# Patient Record
Sex: Female | Born: 1935 | Race: White | Hispanic: No | Smoking: Never smoker
Health system: Southern US, Community
[De-identification: ages and names within clinical notes are randomized; demographics above are authoritative.]

## PROBLEM LIST (undated history)

## (undated) DIAGNOSIS — F329 Major depressive disorder, single episode, unspecified: Secondary | ICD-10-CM

## (undated) DIAGNOSIS — R7302 Impaired glucose tolerance (oral): Secondary | ICD-10-CM

## (undated) DIAGNOSIS — F32A Depression, unspecified: Secondary | ICD-10-CM

## (undated) DIAGNOSIS — H9190 Unspecified hearing loss, unspecified ear: Secondary | ICD-10-CM

## (undated) DIAGNOSIS — IMO0001 Reserved for inherently not codable concepts without codable children: Secondary | ICD-10-CM

## (undated) DIAGNOSIS — H3530 Unspecified macular degeneration: Secondary | ICD-10-CM

## (undated) DIAGNOSIS — Z950 Presence of cardiac pacemaker: Secondary | ICD-10-CM

## (undated) DIAGNOSIS — I442 Atrioventricular block, complete: Secondary | ICD-10-CM

## (undated) DIAGNOSIS — I472 Ventricular tachycardia, unspecified: Secondary | ICD-10-CM

## (undated) DIAGNOSIS — M1990 Unspecified osteoarthritis, unspecified site: Secondary | ICD-10-CM

## (undated) DIAGNOSIS — E785 Hyperlipidemia, unspecified: Secondary | ICD-10-CM

## (undated) DIAGNOSIS — T7840XA Allergy, unspecified, initial encounter: Secondary | ICD-10-CM

## (undated) DIAGNOSIS — E039 Hypothyroidism, unspecified: Secondary | ICD-10-CM

## (undated) DIAGNOSIS — I428 Other cardiomyopathies: Secondary | ICD-10-CM

## (undated) DIAGNOSIS — I10 Essential (primary) hypertension: Secondary | ICD-10-CM

## (undated) HISTORY — DX: Unspecified osteoarthritis, unspecified site: M19.90

## (undated) HISTORY — DX: Hyperlipidemia, unspecified: E78.5

## (undated) HISTORY — DX: Allergy, unspecified, initial encounter: T78.40XA

## (undated) HISTORY — DX: Hypothyroidism, unspecified: E03.9

## (undated) HISTORY — PX: TONSILLECTOMY: SUR1361

## (undated) HISTORY — PX: APPENDECTOMY: SHX54

## (undated) HISTORY — DX: Other cardiomyopathies: I42.8

## (undated) HISTORY — DX: Unspecified macular degeneration: H35.30

## (undated) HISTORY — PX: OTHER SURGICAL HISTORY: SHX169

## (undated) HISTORY — DX: Ventricular tachycardia: I47.2

## (undated) HISTORY — DX: Essential (primary) hypertension: I10

## (undated) HISTORY — DX: Ventricular tachycardia, unspecified: I47.20

## (undated) HISTORY — DX: Presence of cardiac pacemaker: Z95.0

## (undated) HISTORY — DX: Impaired glucose tolerance (oral): R73.02

## (undated) HISTORY — DX: Atrioventricular block, complete: I44.2

## (undated) HISTORY — PX: CHOLECYSTECTOMY: SHX55

---

## 1998-12-16 ENCOUNTER — Emergency Department (HOSPITAL_COMMUNITY): Admission: EM | Admit: 1998-12-16 | Discharge: 1998-12-16 | Payer: Self-pay | Admitting: Emergency Medicine

## 1998-12-16 ENCOUNTER — Encounter: Payer: Self-pay | Admitting: Emergency Medicine

## 1999-09-25 ENCOUNTER — Other Ambulatory Visit: Admission: RE | Admit: 1999-09-25 | Discharge: 1999-09-25 | Payer: Self-pay | Admitting: Family Medicine

## 2000-09-06 ENCOUNTER — Encounter: Payer: Self-pay | Admitting: Family Medicine

## 2000-09-06 ENCOUNTER — Ambulatory Visit (HOSPITAL_COMMUNITY): Admission: RE | Admit: 2000-09-06 | Discharge: 2000-09-06 | Payer: Self-pay | Admitting: Family Medicine

## 2001-05-19 ENCOUNTER — Other Ambulatory Visit: Admission: RE | Admit: 2001-05-19 | Discharge: 2001-05-19 | Payer: Self-pay | Admitting: Family Medicine

## 2002-01-23 ENCOUNTER — Ambulatory Visit (HOSPITAL_COMMUNITY): Admission: RE | Admit: 2002-01-23 | Discharge: 2002-01-23 | Payer: Self-pay | Admitting: Family Medicine

## 2002-01-23 ENCOUNTER — Encounter: Payer: Self-pay | Admitting: Family Medicine

## 2002-07-20 ENCOUNTER — Encounter: Admission: RE | Admit: 2002-07-20 | Discharge: 2002-07-20 | Payer: Self-pay | Admitting: Cardiology

## 2002-07-20 ENCOUNTER — Encounter: Payer: Self-pay | Admitting: Cardiology

## 2002-08-08 ENCOUNTER — Emergency Department (HOSPITAL_COMMUNITY): Admission: EM | Admit: 2002-08-08 | Discharge: 2002-08-08 | Payer: Self-pay | Admitting: Emergency Medicine

## 2002-09-22 ENCOUNTER — Ambulatory Visit: Admission: RE | Admit: 2002-09-22 | Discharge: 2002-09-22 | Payer: Self-pay | Admitting: Family Medicine

## 2002-10-14 ENCOUNTER — Ambulatory Visit (HOSPITAL_COMMUNITY): Admission: RE | Admit: 2002-10-14 | Discharge: 2002-10-14 | Payer: Self-pay | Admitting: Cardiology

## 2002-10-14 ENCOUNTER — Encounter (INDEPENDENT_AMBULATORY_CARE_PROVIDER_SITE_OTHER): Payer: Self-pay | Admitting: Cardiology

## 2002-10-20 ENCOUNTER — Encounter: Payer: Self-pay | Admitting: Cardiology

## 2002-10-20 ENCOUNTER — Ambulatory Visit (HOSPITAL_COMMUNITY): Admission: RE | Admit: 2002-10-20 | Discharge: 2002-10-20 | Payer: Self-pay | Admitting: Cardiology

## 2003-04-29 ENCOUNTER — Inpatient Hospital Stay (HOSPITAL_COMMUNITY): Admission: RE | Admit: 2003-04-29 | Discharge: 2003-05-01 | Payer: Self-pay | Admitting: Internal Medicine

## 2003-05-10 ENCOUNTER — Observation Stay (HOSPITAL_COMMUNITY): Admission: EM | Admit: 2003-05-10 | Discharge: 2003-05-11 | Payer: Self-pay | Admitting: Emergency Medicine

## 2003-05-19 ENCOUNTER — Inpatient Hospital Stay (HOSPITAL_COMMUNITY): Admission: EM | Admit: 2003-05-19 | Discharge: 2003-05-25 | Payer: Self-pay | Admitting: Emergency Medicine

## 2003-05-20 ENCOUNTER — Encounter (INDEPENDENT_AMBULATORY_CARE_PROVIDER_SITE_OTHER): Payer: Self-pay | Admitting: Cardiology

## 2003-09-06 ENCOUNTER — Emergency Department (HOSPITAL_COMMUNITY): Admission: EM | Admit: 2003-09-06 | Discharge: 2003-09-06 | Payer: Self-pay | Admitting: *Deleted

## 2003-09-07 ENCOUNTER — Inpatient Hospital Stay (HOSPITAL_COMMUNITY): Admission: EM | Admit: 2003-09-07 | Discharge: 2003-09-08 | Payer: Self-pay | Admitting: Emergency Medicine

## 2003-09-22 ENCOUNTER — Inpatient Hospital Stay (HOSPITAL_COMMUNITY): Admission: EM | Admit: 2003-09-22 | Discharge: 2003-09-24 | Payer: Self-pay | Admitting: Emergency Medicine

## 2005-01-01 IMAGING — CR DG CHEST 1V PORT
1 series · 1 of 1 positions shown · non-contrast
Comparison: none

CLINICAL DATA: Syncopal episode.  
 AP PORTABLE CHEST ? 09/06/2003 ([DATE] HOURS)
 Compare examination 05/10/2003. 
 Cardiac enlargement with left ventricular hypertrophic change redemonstrated.  Cardiac pacer remains in satisfactory position.  
 Lungs clear without edema, infiltrates or effusions.  Accelerated degenerative change is seen about the left shoulder.  
 IMPRESSION
 Cardiac enlargement without acute pulmonary process

[view not recorded]
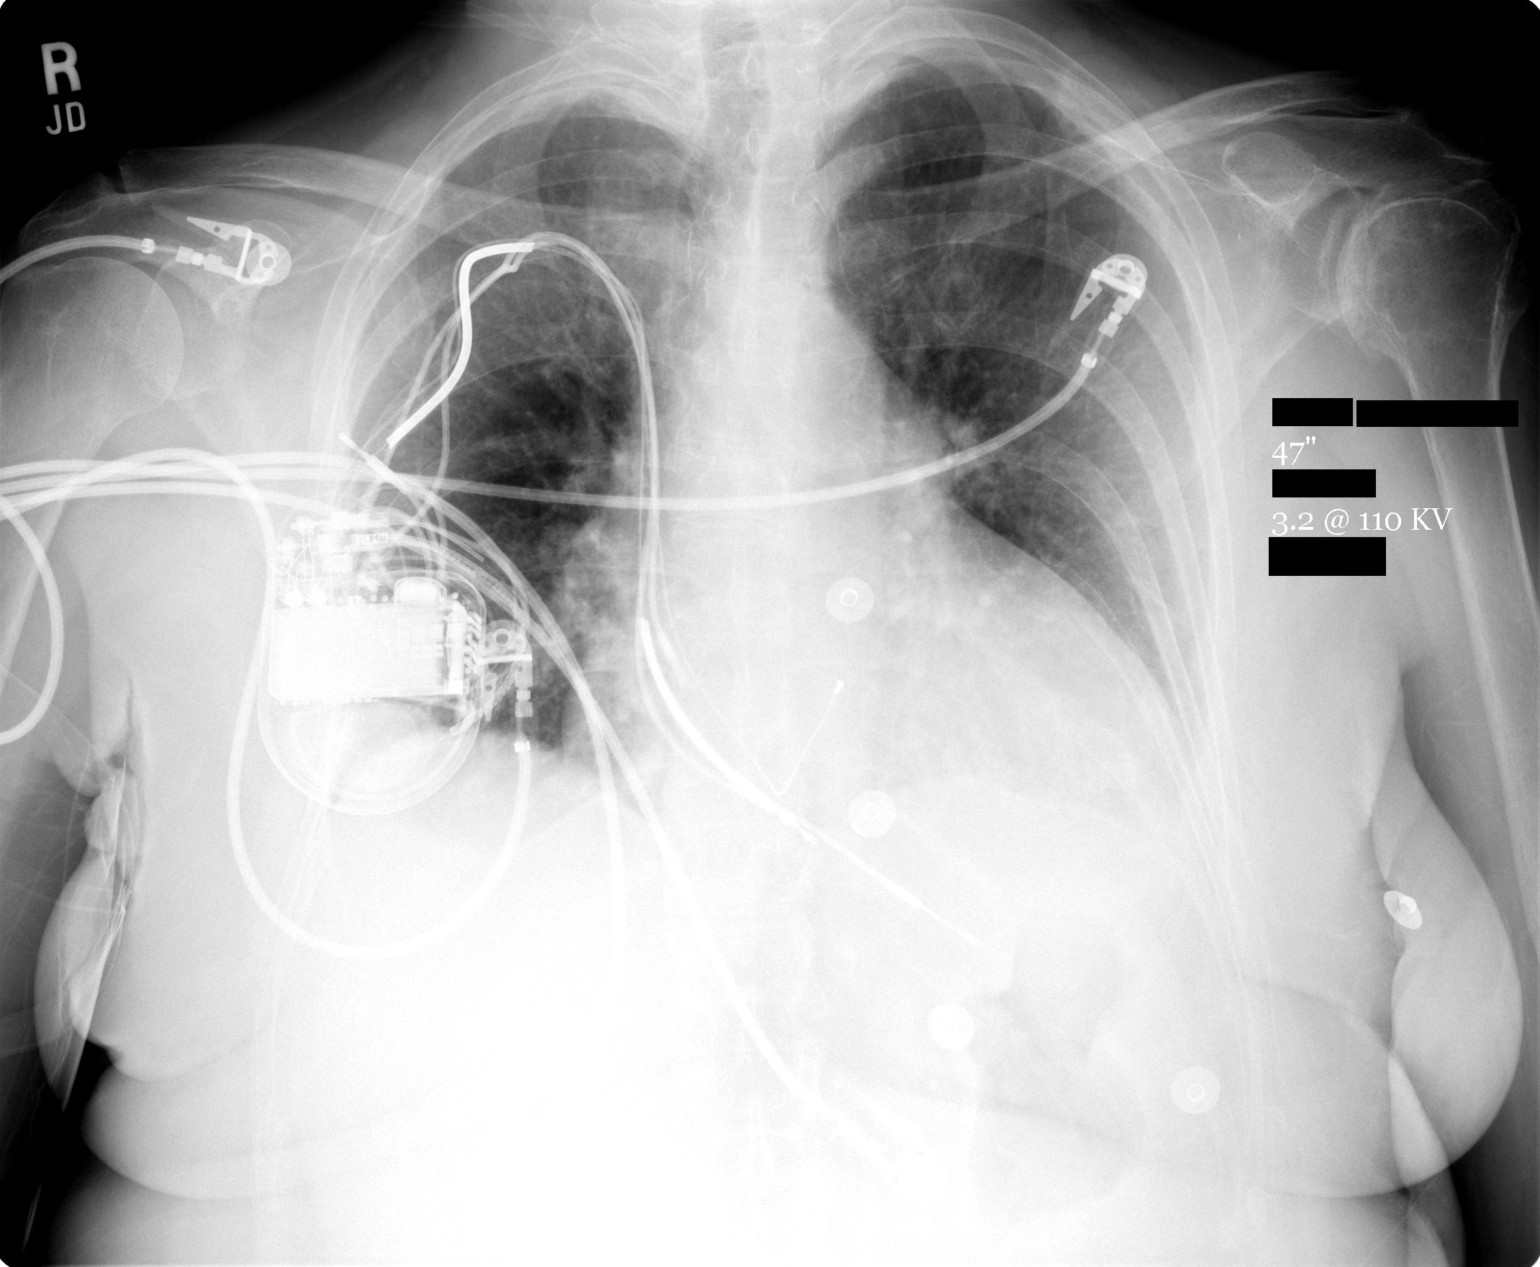

[1 of 1 positions shown; findings below may reference images not displayed]

## 2005-01-03 IMAGING — CR DG CHEST 2V
2 series · 2 of 2 positions shown · non-contrast
Comparison: 09/06/03.

CLINICAL DATA: Pacemaker failure. 
 TWO VIEW CHEST

[view not recorded (1 of 2)]
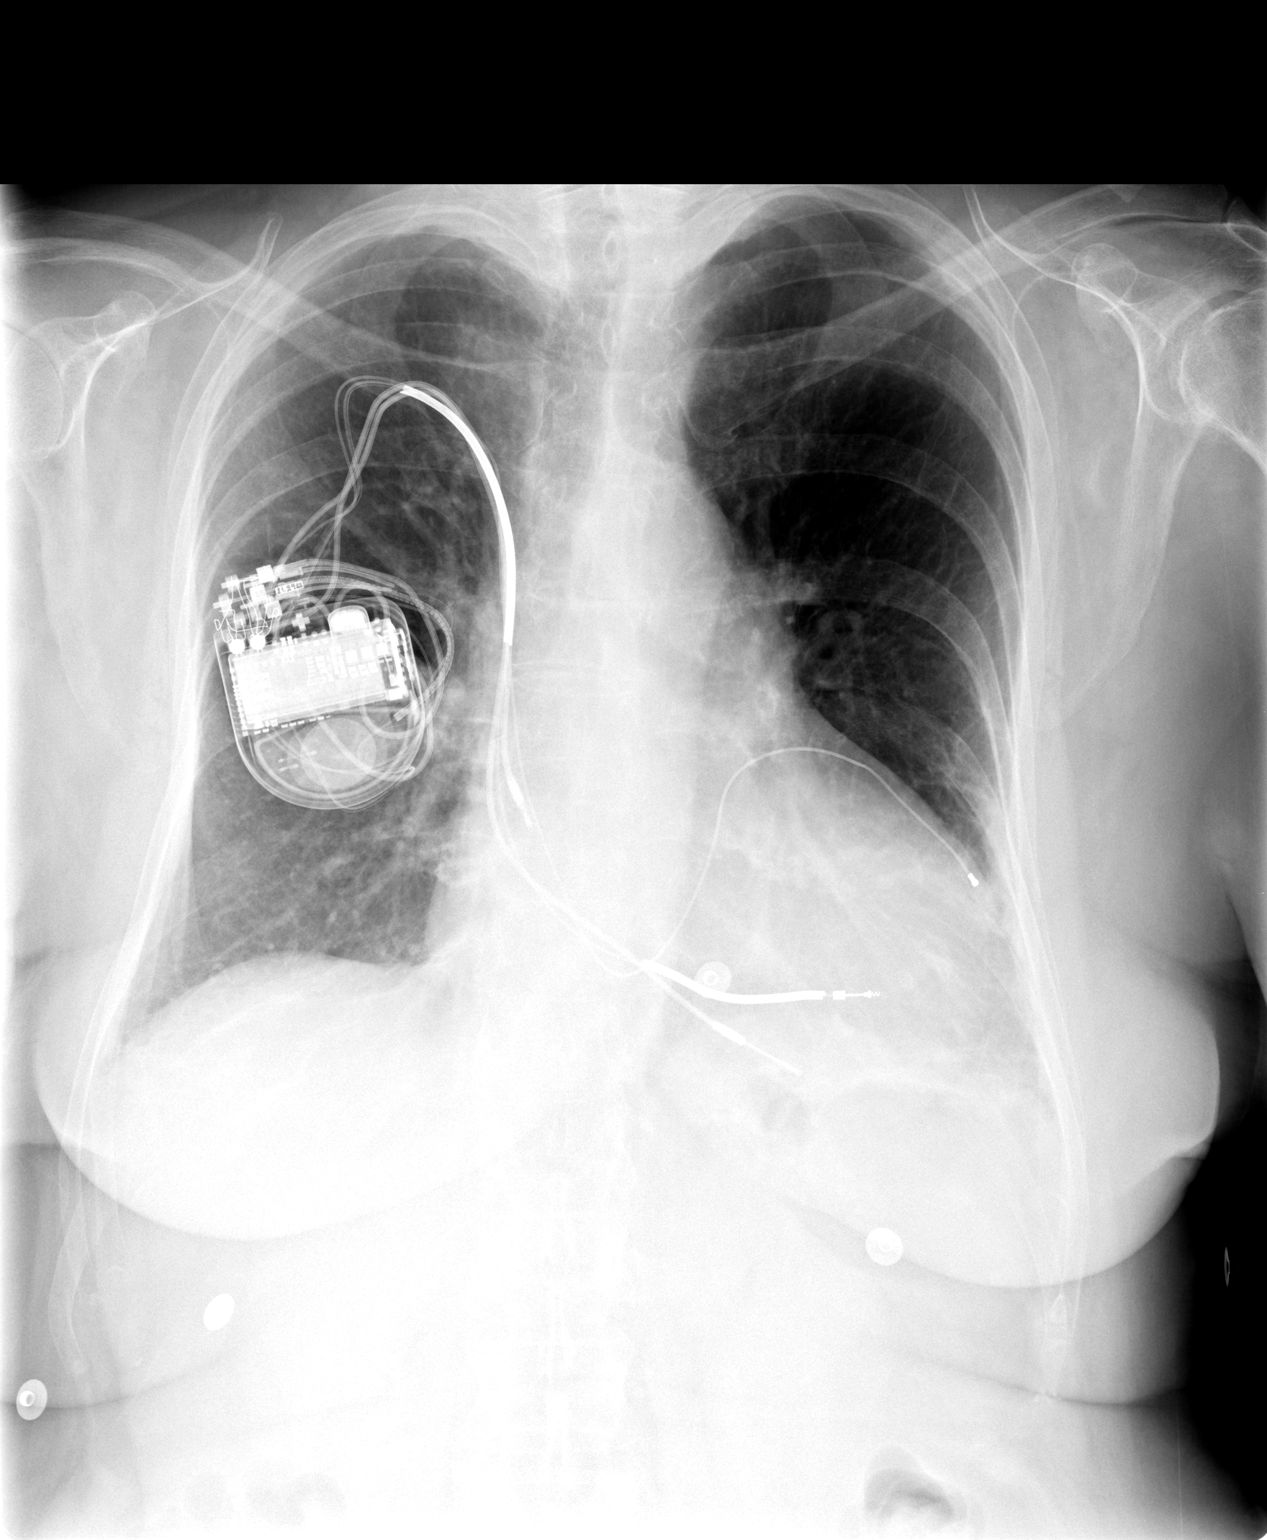

[view not recorded (2 of 2)]
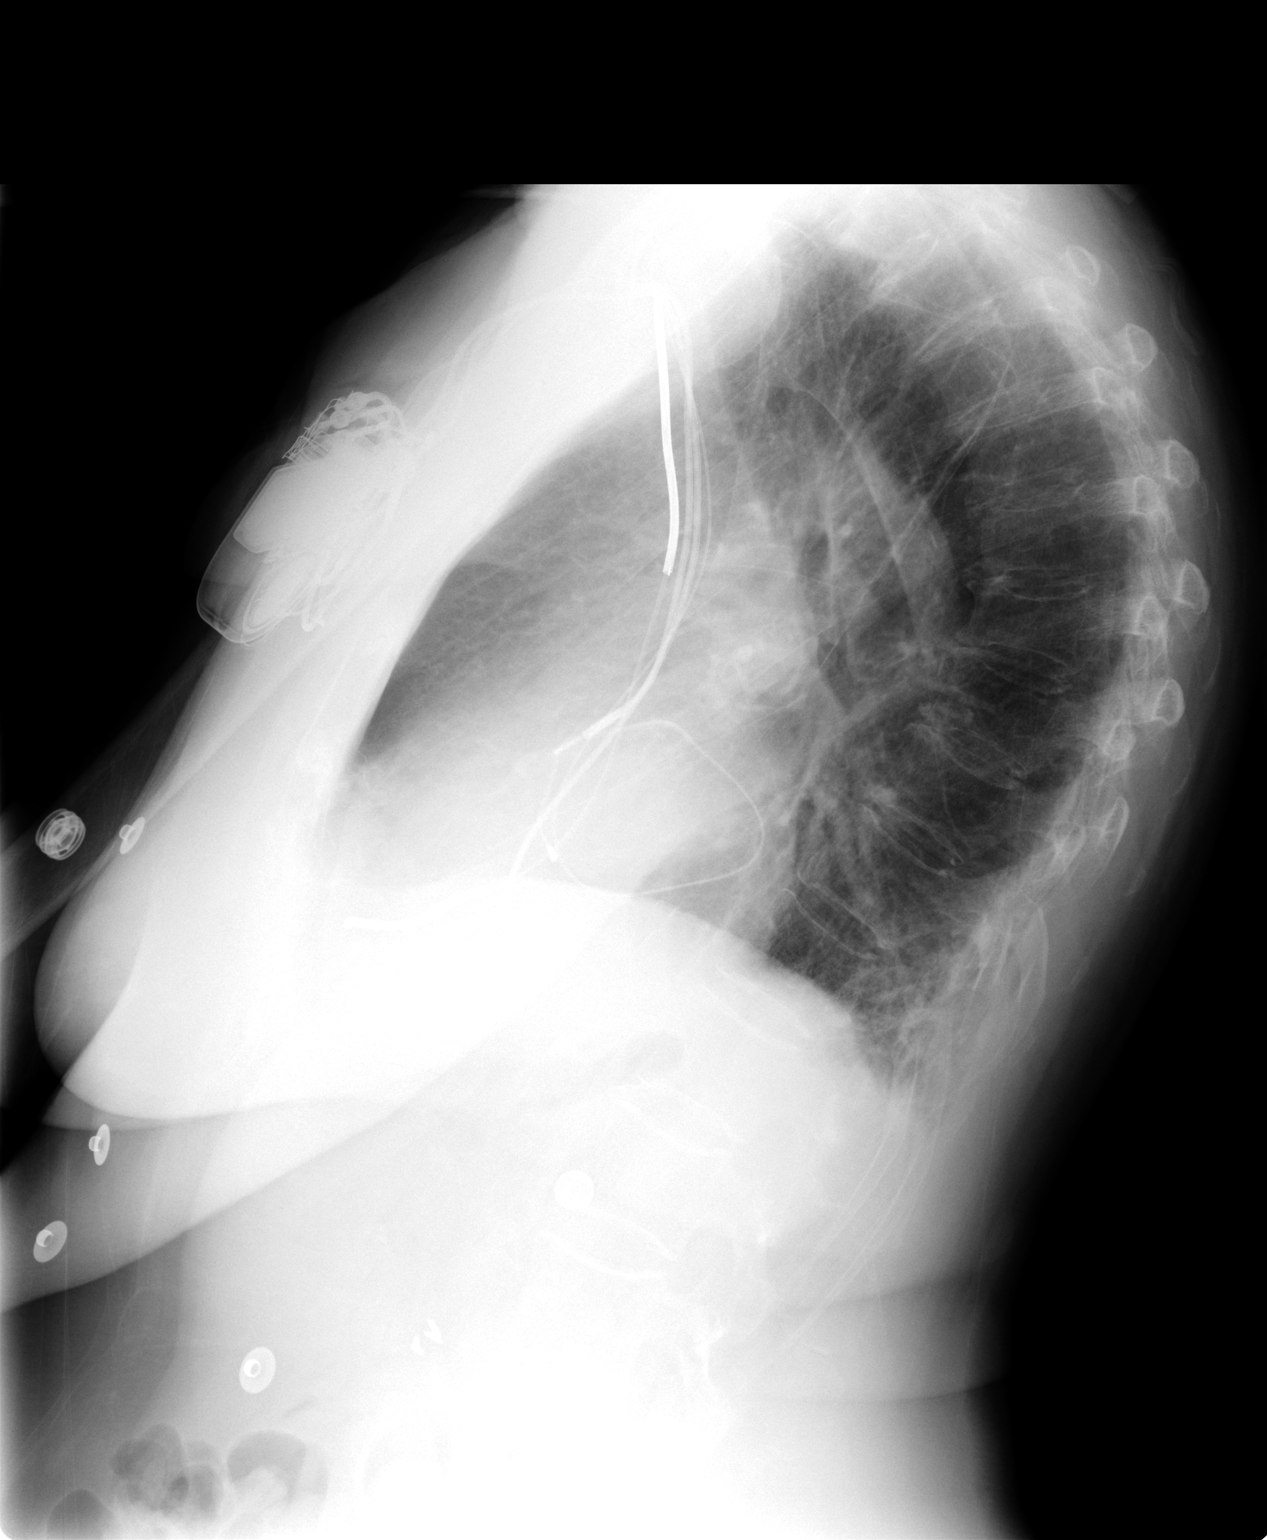

[2 of 2 positions shown; findings below may reference images not displayed]

Heart enlarged.  No congestive heart failure or active disease.  Trilead intracardiac pacer/defibrillator unchanged. 
 Bones demineralized with senile kyphosis. 
 IMPRESSION
 1.  Cardiomegaly and trilead pacer/defibrillator. 
 2.  No congestive heart failure or active disease.

## 2006-04-18 ENCOUNTER — Ambulatory Visit: Payer: Self-pay | Admitting: Internal Medicine

## 2006-04-26 ENCOUNTER — Ambulatory Visit: Payer: Self-pay | Admitting: Internal Medicine

## 2006-04-26 ENCOUNTER — Ambulatory Visit: Admission: RE | Admit: 2006-04-26 | Discharge: 2006-04-26 | Payer: Self-pay | Admitting: Internal Medicine

## 2009-10-25 ENCOUNTER — Encounter: Payer: Self-pay | Admitting: Internal Medicine

## 2010-01-09 ENCOUNTER — Ambulatory Visit: Payer: Self-pay | Admitting: Cardiology

## 2010-04-01 ENCOUNTER — Encounter: Payer: Self-pay | Admitting: Internal Medicine

## 2010-05-04 ENCOUNTER — Ambulatory Visit: Payer: Self-pay | Admitting: Internal Medicine

## 2010-06-14 ENCOUNTER — Ambulatory Visit: Payer: Self-pay | Admitting: Cardiology

## 2010-06-22 NOTE — Procedures (Signed)
Summary: icd check/  medtronic   Current Medications (verified): 1)  Carvedilol 25 Mg Tabs (Carvedilol) .... One By Mouth Two Times A Day 2)  Synthroid 50 Mcg Tabs (Levothyroxine Sodium) .... One By Mouth Daily 3)  Aspirin 325 Mg Tabs (Aspirin) .... One By Mouth Daily 4)  Ra Fish Oil 1000 Mg Caps (Omega-3 Fatty Acids) .... One By Mouth Daily 5)  Tramadol Hcl 50 Mg Tabs (Tramadol Hcl) .... As Needed 6)  Valium 10 Mg Tabs (Diazepam) .... 2 By Mouth Daily  Allergies (verified): No Known Drug Allergies   ICD Specifications Following MD:  Lewayne Bunting, MD     Referring MD:  Vonna Drafts ICD Vendor:  Medtronic     ICD Model Number:  [id number]     ICD Serial Number:  [id number] ICD DOI:  04/26/2006     ICD Implanting MD:  Lewayne Bunting, MD  Lead 1:    Location: RA     DOI: 03/18/1995     Model #: [id number]     Serial #: [id number]     Status: active Lead 2:    Location: RV     DOI: 04/29/2003     Model #: [id number]     Serial #: [id number]     Status: active Lead 3:    Location: LV     DOI: 04/29/2003     Model #: [id number]     Serial #: [id number]     Status: active  Indications::  ICM  CHB   ICD Follow Up Remote Check?  No Battery Voltage:  2.64 V     Charge Time:  12.74 seconds     Underlying rhythm:  dependent ICD Dependent:  Yes       ICD Device Measurements Atrium:  Amplitude: 0.7 mV, Impedance: 376 ohms, Threshold: 1.0 V at 0.4 msec Right Ventricle:  Impedance: 416 ohms, Threshold: 1.0 V at 0.5 msec Left Ventricle:  Impedance: 584 ohms, Threshold: 1.0 V at 0.8 msec Shock Impedance: 44/56 ohms   Episodes MS Episodes:  3     Percent Mode Switch:  <0.1%     Coumadin:  No Shock:  0     ATP:  0     Nonsustained:  0     Atrial Pacing:  0.3%     Ventricular Pacing:  100%  Brady Parameters Mode DDDR     Lower Rate Limit:  50     Upper Rate Limit 130 PAV 130     Sensed AV Delay:  100  Tachy Zones VF:  188     VT:  250     VT1:  162     Next Remote Date:  08/03/2010     Next Cardiology Appt Due:   10/20/2010 Tech Comments:  No parameter changes.  Device function normal.  Ms. Alas is extremely sensitive to testing and will take Valium before her visit to lessen her anxiety. Optivol and thoracic impedance abnormal 8/18-10/10.   Carelink transmissions every 3 months. ROV 6 months with Dr. Mckinley Jewel, LPN  May 04, 2010 4:19 PM

## 2010-06-22 NOTE — Letter (Signed)
Summary: Revision [facility] Office Visit Note  [facility] Office Visit Note   Imported By: Roderic Ovens 11/16/2009 09:54:34  _____________________________________________________________________  External Attachment:    Type:   Image     Comment:   External Document

## 2010-06-22 NOTE — Miscellaneous (Signed)
Summary: Device preload  Clinical Lists Changes  Observations: Added new observation of ICD INDICATN: ICM  CHB (04/01/2010 10:02) Added new observation of ICDLEADSTAT3: active (04/01/2010 10:02) Added new observation of ICDLEADSER3: [id number] V (04/01/2010 10:02) Added new observation of ICDLEADMOD3: 4193  (04/01/2010 10:02) Added new observation of ICDLEADDOI3: 04/29/2003  (04/01/2010 10:02) Added new observation of ICDLEADLOC3: LV  (04/01/2010 10:02) Added new observation of ICDLEADSTAT2: active  (04/01/2010 10:02) Added new observation of ICDLEADSER2: [id number] V  (04/01/2010 10:02) Added new observation of ICDLEADMOD2: 6949  (04/01/2010 10:02) Added new observation of ICDLEADDOI2: 04/29/2003  (04/01/2010 10:02) Added new observation of ICDLEADLOC2: RV  (04/01/2010 10:02) Added new observation of ICDLEADSTAT1: active  (04/01/2010 10:02) Added new observation of ICDLEADSER1: 01016RH  (04/01/2010 10:02) Added new observation of ICDLEADMOD1: 438-07  (04/01/2010 10:02) Added new observation of ICDLEADDOI1: 03/18/1995  (04/01/2010 10:02) Added new observation of ICDLEADLOC1: RA  (04/01/2010 10:02) Added new observation of ICD IMP MD: Lewayne Bunting, MD  (04/01/2010 10:02) Added new observation of ICD IMPL DTE: 04/26/2006  (04/01/2010 10:02) Added new observation of ICD SERL#: [id number] H  (04/01/2010 10:02) Added new observation of ICD MODL#: 7299  (04/01/2010 10:02) Added new observation of ICDMANUFACTR: Medtronic  (04/01/2010 10:02) Added new observation of IDC REFER MD: Vonna Drafts  (04/01/2010 10:02) Added new observation of ICD MD: Lewayne Bunting, MD  (04/01/2010 10:02)       ICD Specifications Following MD:  Lewayne Bunting, MD     Referring MD:  Vonna Drafts ICD Vendor:  Medtronic     ICD Model Number:  7299     ICD Serial Number:  [id number] ICD DOI:  04/26/2006     ICD Implanting MD:  Lewayne Bunting, MD  Lead 1:    Location: RA     DOI: 03/18/1995     Model #: 485-46      Serial #: [id number]     Status: active Lead 2:    Location: RV     DOI: 04/29/2003     Model #: [id number]     Serial #: [id number]     Status: active Lead 3:    Location: LV     DOI: 04/29/2003     Model #: [id number]     Serial #: [id number]     Status: active  Indications::  ICM  CHB

## 2010-06-22 NOTE — Cardiovascular Report (Signed)
Summary: Office Visit   Office Visit   Imported By: Roderic Ovens 05/19/2010 16:24:46  _____________________________________________________________________  External Attachment:    Type:   Image     Comment:   External Document

## 2010-08-03 ENCOUNTER — Encounter: Payer: Self-pay | Admitting: Internal Medicine

## 2010-08-03 ENCOUNTER — Encounter (INDEPENDENT_AMBULATORY_CARE_PROVIDER_SITE_OTHER): Payer: Medicare Other

## 2010-08-03 DIAGNOSIS — I509 Heart failure, unspecified: Secondary | ICD-10-CM

## 2010-08-03 DIAGNOSIS — I428 Other cardiomyopathies: Secondary | ICD-10-CM

## 2010-08-10 ENCOUNTER — Telehealth: Payer: Self-pay | Admitting: Cardiology

## 2010-08-10 NOTE — Telephone Encounter (Signed)
Patient's pacer will not need to be checked.  Patient aware

## 2010-08-10 NOTE — Telephone Encounter (Signed)
Patient needs to know if pacemaker will be "tweeked", as she will need to take valium before coming to appointment.  Please call patient.

## 2010-08-12 ENCOUNTER — Encounter: Payer: Self-pay | Admitting: *Deleted

## 2010-08-16 ENCOUNTER — Other Ambulatory Visit: Payer: Self-pay | Admitting: *Deleted

## 2010-08-16 DIAGNOSIS — I428 Other cardiomyopathies: Secondary | ICD-10-CM

## 2010-08-16 MED ORDER — CARVEDILOL 25 MG PO TABS: 25.0000 mg | ORAL_TABLET | Freq: Two times a day (BID) | ORAL | Status: DC

## 2010-08-16 NOTE — Telephone Encounter (Signed)
escribe medication per fax request  

## 2010-08-20 ENCOUNTER — Encounter: Payer: Self-pay | Admitting: *Deleted

## 2010-08-22 ENCOUNTER — Encounter: Payer: Self-pay | Admitting: Physician Assistant

## 2010-08-22 ENCOUNTER — Ambulatory Visit: Payer: Self-pay | Admitting: Physician Assistant

## 2010-08-22 ENCOUNTER — Encounter: Payer: Self-pay | Admitting: *Deleted

## 2010-08-22 ENCOUNTER — Ambulatory Visit (INDEPENDENT_AMBULATORY_CARE_PROVIDER_SITE_OTHER): Payer: Medicare Other | Admitting: Physician Assistant

## 2010-08-22 VITALS — BP 116/72 | HR 71 | Ht 66.0 in | Wt 180.0 lb

## 2010-08-22 DIAGNOSIS — I472 Ventricular tachycardia, unspecified: Secondary | ICD-10-CM | POA: Insufficient documentation

## 2010-08-22 DIAGNOSIS — Z950 Presence of cardiac pacemaker: Secondary | ICD-10-CM | POA: Insufficient documentation

## 2010-08-22 DIAGNOSIS — I5020 Unspecified systolic (congestive) heart failure: Secondary | ICD-10-CM | POA: Insufficient documentation

## 2010-08-22 DIAGNOSIS — E039 Hypothyroidism, unspecified: Secondary | ICD-10-CM | POA: Insufficient documentation

## 2010-08-22 DIAGNOSIS — I10 Essential (primary) hypertension: Secondary | ICD-10-CM | POA: Insufficient documentation

## 2010-08-22 DIAGNOSIS — I428 Other cardiomyopathies: Secondary | ICD-10-CM

## 2010-08-22 DIAGNOSIS — E785 Hyperlipidemia, unspecified: Secondary | ICD-10-CM | POA: Insufficient documentation

## 2010-08-22 NOTE — Progress Notes (Signed)
History of Present Illness: Primary Cardiologist:  Dr. Peter Swaziland Primary Electrophysiologist:  Dr. Lewayne Bunting  Tiffany [name] is a 77 y.o. female with a h/o systolic CHF secondary to NICM, EF 45-50% by last assessment in 2009, minimal CAD by cath in 2004 (40-50% LAD), h/o VTach, s/p BiV-ICD.  Her device is at [facility] and she is scheduled today for preoperative workup.  She denies chest pain.  She has fairly chronic dyspnea with exertion.  She describes and NYHA class II to class IIb symptoms.  She sleeps on 2 pillows.  This is chronic without change.  She denies PND.  She has chronic mild pedal edema.  She denies syncope.  She denies any shocks from her defibrillator.  Her weights have been fairly stable.  Past Medical History  Diagnosis Date  . Status post biventricular pacemaker     with AICD; 2004; revision 2007  . Essential hypertension, benign   . Hyperlipidemia   . Hypothyroidism   . Complete heart block   . Nonischemic cardiomyopathy     EF 45-50% in 2009  . Systolic heart failure   . Glaucoma   . Macular degeneration   . Ventricular tachycardia    Past Surgical History  Procedure Date  . Cardiac defibrillator placement   . Cholecystectomy   . Appendectomy   . Tonsillectomy   . Cataract surgery      Current Outpatient Prescriptions  Medication Sig Dispense Refill  . aspirin 325 MG EC tablet Take 325 mg by mouth daily. 2 tabs bid      . carvedilol (COREG) 25 MG tablet Take 25 mg by mouth 2 (two) times daily with a meal.       . diazepam (VALIUM) 10 MG tablet Take 10 mg by mouth as directed.        . fish oil-omega-3 fatty acids 1000 MG capsule Take 2 g by mouth daily.        Marland Kitchen levothyroxine (SYNTHROID, LEVOTHROID) 50 MCG tablet Take 50 mcg by mouth as needed.       Marland Kitchen telmisartan (MICARDIS) 80 MG tablet Take 80 mg by mouth daily.        . timolol (BETIMOL) 0.5 % ophthalmic solution Place 1 drop into both eyes 2 (two) times daily.        . traMADol (ULTRAM) 50 MG tablet  Take 50 mg by mouth every 6 (six) hours as needed.        . travoprost, benzalkonium, (TRAVATAN) 0.004 % ophthalmic solution Place 1 drop into both eyes at bedtime.        Marland Kitchen DISCONTD: carvedilol (COREG) 25 MG tablet Take 1 tablet (25 mg total) by mouth 2 (two) times daily with a meal.  60 tablet  5    No Known Allergies  History  Substance Use Topics  . Smoking status: Never Smoker   . Smokeless tobacco: Never Used  . Alcohol Use: No   Family History  Problem Relation Age of Onset  . Hypertension Mother   . Coronary artery disease Mother     ROS:  Please see HPI.  She denies fevers, chills, cough, melena, hematochezia, hematuria, rash, weight changes.  All other systems reviewed and negative.  Vital Signs: BP 116/72  Pulse 71  Ht 5\' 6"  (1.676 m)  Wt 180 lb (81.647 kg)  BMI 29.05 kg/m2  PHYSICAL EXAM: Well nourished, well developed, in no acute distress HEENT: normal Neck: no JVD Endocrine: No thyromegaly Vascular: No carotid bruits bilaterally  Cardiac:  normal S1, S2; RRR; no murmur Chest:  Demonstrates right-sided device without tenderness or swelling Lungs:  clear to auscultation bilaterally, no wheezing, rhonchi or rales Abd: soft, nontender, no hepatomegaly Ext: Trace, bilateral ankle edema Skin: warm and dry Neuro:  CNs 2-12 intact, no focal abnormalities noted Psych: Normal affect  EKG:  Sinus rhythm, heart rate 71, ventricular paced  ASSESSMENT AND PLAN:

## 2010-08-22 NOTE — Patient Instructions (Signed)
Your physician has recommended that you have a BI V PACER CHANGE OUT. THIS WILL BE ON 09/06/10 @ 3:30 PM, PLEASE SEE INSTRUCTION SHEET. Tiffany [name]

## 2010-08-22 NOTE — Assessment & Plan Note (Signed)
Her device is at [facility].  She has not received a shock from her defibrillator.  She denies a history of syncope.  Dr. Ladona Ridgel also saw the patient.  He had a discussion with her regarding whether or not to proceed with pacer-ICD generator change versus changing her generator for just the biventricular pacemaker.  After discussion with the patient and her son, it has been decided to proceed with replacement of the biventricular pacer generator only.  Risks of the procedure have been discussed with the patient and include infection and bleeding.  She will be set up with Dr. Ladona Ridgel at some point in the next 2 weeks.

## 2010-08-22 NOTE — Assessment & Plan Note (Signed)
Her volume is stable.  She is on an excellent medical regimen.  She will continue follow up with Dr. Swaziland.

## 2010-08-30 ENCOUNTER — Other Ambulatory Visit (INDEPENDENT_AMBULATORY_CARE_PROVIDER_SITE_OTHER): Payer: Medicare Other | Admitting: *Deleted

## 2010-08-30 DIAGNOSIS — I428 Other cardiomyopathies: Secondary | ICD-10-CM

## 2010-08-30 DIAGNOSIS — I472 Ventricular tachycardia: Secondary | ICD-10-CM

## 2010-08-30 LAB — CBC WITH DIFFERENTIAL/PLATELET
Basophils Absolute: 0.1 10*3/uL (ref 0.0–0.1)
Basophils Relative: 0.7 % (ref 0.0–3.0)
Eosinophils Absolute: 0.4 10*3/uL (ref 0.0–0.7)
Eosinophils Relative: 4.7 % (ref 0.0–5.0)
HCT: 37.9 % (ref 36.0–46.0)
Hemoglobin: 12.9 g/dL (ref 12.0–15.0)
Lymphocytes Relative: 35.8 % (ref 12.0–46.0)
Lymphs Abs: 3.1 10*3/uL (ref 0.7–4.0)
MCHC: 33.9 g/dL (ref 30.0–36.0)
MCV: 103.1 fl — ABNORMAL HIGH (ref 78.0–100.0)
Monocytes Absolute: 0.5 10*3/uL (ref 0.1–1.0)
Monocytes Relative: 6 % (ref 3.0–12.0)
Neutro Abs: 4.6 10*3/uL (ref 1.4–7.7)
Neutrophils Relative %: 52.8 % (ref 43.0–77.0)
Platelets: 166 10*3/uL (ref 150.0–400.0)
RBC: 3.68 Mil/uL — ABNORMAL LOW (ref 3.87–5.11)
RDW: 13.4 % (ref 11.5–14.6)
WBC: 8.8 10*3/uL (ref 4.5–10.5)

## 2010-08-30 LAB — BASIC METABOLIC PANEL
BUN: 20 mg/dL (ref 6–23)
CO2: 26 mEq/L (ref 19–32)
Calcium: 9.3 mg/dL (ref 8.4–10.5)
Chloride: 104 mEq/L (ref 96–112)
Creatinine, Ser: 1.1 mg/dL (ref 0.4–1.2)
GFR: 48.02 mL/min — ABNORMAL LOW (ref >60.00)
Glucose, Bld: 98 mg/dL (ref 70–99)
Potassium: 4.4 mEq/L (ref 3.5–5.1)
Sodium: 140 mEq/L (ref 135–145)

## 2010-08-30 LAB — PROTIME-INR
INR: 1 ratio (ref 0.8–1.0)
Prothrombin Time: 11.4 s (ref 10.2–12.4)

## 2010-08-31 ENCOUNTER — Telehealth: Payer: Self-pay | Admitting: Internal Medicine

## 2010-08-31 ENCOUNTER — Telehealth: Payer: Self-pay | Admitting: Cardiology

## 2010-08-31 NOTE — Telephone Encounter (Signed)
Patient had labs done yesterday (08/30/10) for an incoming procedure. Labs results mailed today to home address as requested per pt.

## 2010-08-31 NOTE — Telephone Encounter (Deleted)
Having a PPM generator change nx wk and would like to obtain her lab work results.

## 2010-09-05 ENCOUNTER — Telehealth: Payer: Self-pay | Admitting: Internal Medicine

## 2010-09-05 NOTE — Telephone Encounter (Signed)
Pt calling stating she lost instructions for pacer changer over and doesn't know time of procedure, where to go, and all other instructions--advised of all of the above over phone and pt agrees--nt

## 2010-09-06 ENCOUNTER — Ambulatory Visit (HOSPITAL_COMMUNITY)
Admission: RE | Admit: 2010-09-06 | Discharge: 2010-09-06 | Disposition: A | Discharge status: Home / Self Care | Payer: Medicare Other | Source: Ambulatory Visit | Attending: Internal Medicine | Admitting: Internal Medicine

## 2010-09-06 DIAGNOSIS — I509 Heart failure, unspecified: Secondary | ICD-10-CM | POA: Insufficient documentation

## 2010-09-06 DIAGNOSIS — I428 Other cardiomyopathies: Secondary | ICD-10-CM | POA: Insufficient documentation

## 2010-09-06 DIAGNOSIS — Z4502 Encounter for adjustment and management of automatic implantable cardiac defibrillator: Secondary | ICD-10-CM | POA: Insufficient documentation

## 2010-09-06 LAB — SURGICAL PCR SCREEN
MRSA, PCR: NEGATIVE
Staphylococcus aureus: NEGATIVE

## 2010-09-13 ENCOUNTER — Other Ambulatory Visit: Payer: Self-pay | Admitting: *Deleted

## 2010-09-13 DIAGNOSIS — I10 Essential (primary) hypertension: Secondary | ICD-10-CM

## 2010-09-13 MED ORDER — TELMISARTAN 80 MG PO TABS: 80.0000 mg | ORAL_TABLET | Freq: Every day | ORAL | Status: DC

## 2010-09-13 NOTE — Telephone Encounter (Signed)
escribe medication per fax request  

## 2010-09-14 ENCOUNTER — Other Ambulatory Visit: Payer: Self-pay | Admitting: *Deleted

## 2010-09-14 ENCOUNTER — Ambulatory Visit: Payer: Medicare Other | Admitting: *Deleted

## 2010-09-14 MED ORDER — VITAMIN D (ERGOCALCIFEROL) 1.25 MG (50000 UNIT) PO CAPS: 50000.0000 [IU] | ORAL_CAPSULE | ORAL | Status: DC

## 2010-09-14 NOTE — Telephone Encounter (Signed)
escribe medication per fax request  

## 2010-09-18 ENCOUNTER — Ambulatory Visit: Payer: Medicare Other | Admitting: *Deleted

## 2010-09-18 NOTE — Op Note (Signed)
  [name], [name]                  ACCOUNT NO.:  [id number]  MEDICAL RECORD NO.:  [id number]           PATIENT TYPE:  O  LOCATION:  [facility]                         FACILITY:  [facility]  PHYSICIAN:  Doylene Canning. Ladona Ridgel, MD    DATE OF BIRTH:  February 25, 1923  DATE OF PROCEDURE:  09/06/2010 DATE OF DISCHARGE:  09/06/2010                              OPERATIVE REPORT   PROCEDURE PERFORMED:  Removal of previously implanted Bi-V ICD, which had reached the elective replacement followed by insertion of a Bi-V pacemaker with the LV lead being placed in the RV port and the rate/sense portion of the defibrillator lead being placed in the LV port with capping of the defibrillator coil leads.  INTRODUCTION:  The patient is an 77 year old woman with longstanding non- ischemic cardiomyopathy and congestive heart failure, whose EF improved after Bi-V pacing.  She has reached elective replacement indication on her defibrillator, and because of her advanced age and because of her improvement in LV function, it was deemed most appropriate to proceed with insertion of the Bi-V pacemaker rather than a new Bi-V defibrillator.  DESCRIPTION OF PROCEDURE:  After informed consent was obtained, the patient was taken to the diagnostic EP lab in a fasting state.  After usual preparation and draping, intravenous fentanyl and midazolam was given for sedation.  A 3 mL of lidocaine was infiltrated into the right infraclavicular region.  A 6-cm incision was carried out over this region.  Electrocautery was utilized to dissect down to the ICD pocket. The pocket was entered and with gentle traction the generator was removed.  The leads were evaluated and found to be working satisfactorily.  The threshold was elevated slightly, but it was stable. The new Medtronic Bi-V pacemaker, serial number [id number] was connected to the old atrial RV and LV leads and placed back into the subcutaneous pocket with the connections as previously  mentioned.  At this point, the pocket was irrigated with antibiotic irrigation.  Electrocautery was utilized to assure hemostasis and the incision was closed with 2-0 and 3- 0 Vicryl.  Benzoin and Steri-Strips were painted on the skin.  A pressure dressing was applied and the patient was returned to her room in satisfactory condition.  COMPLICATIONS:  There were no immediate procedure complications.  RESULTS:  Demonstrate successful removal of previously implanted Bi-V ICD, which reached ERI followed by successful insertion of the Bi-V pacemaker.     Doylene Canning. Ladona Ridgel, MD     GWT/MEDQ  D:  09/06/2010  T:  09/07/2010  Job:  [id number]  Electronically Signed by Lewayne Bunting MD on 09/18/2010 07:59:38 AM

## 2010-09-28 ENCOUNTER — Other Ambulatory Visit: Payer: Self-pay | Admitting: *Deleted

## 2010-09-28 MED ORDER — TRAMADOL HCL 50 MG PO TABS
50.0000 mg | ORAL_TABLET | Freq: Four times a day (QID) | ORAL | Status: DC | PRN
Start: 2010-09-28 — End: 2011-02-02

## 2010-09-28 NOTE — Telephone Encounter (Signed)
escribe medication per fax request  

## 2010-10-02 ENCOUNTER — Ambulatory Visit: Payer: Medicare Other | Admitting: *Deleted

## 2010-10-04 ENCOUNTER — Ambulatory Visit (INDEPENDENT_AMBULATORY_CARE_PROVIDER_SITE_OTHER): Payer: Medicare Other | Admitting: *Deleted

## 2010-10-04 DIAGNOSIS — I472 Ventricular tachycardia: Secondary | ICD-10-CM

## 2010-10-04 DIAGNOSIS — I428 Other cardiomyopathies: Secondary | ICD-10-CM

## 2010-10-04 NOTE — Progress Notes (Signed)
Pacemaker wound check

## 2010-10-06 ENCOUNTER — Other Ambulatory Visit: Payer: Self-pay | Admitting: *Deleted

## 2010-10-06 MED ORDER — DIAZEPAM 10 MG PO TABS: 10.0000 mg | ORAL_TABLET | ORAL | Status: DC

## 2010-10-06 NOTE — H&P (Signed)
NAME:  MARNI, FRANZONI                            ACCOUNT NO.:  [id number]   MEDICAL RECORD NO.:  [id number]                   PATIENT TYPE:  OBV   LOCATION:  4733                                 FACILITY:  [facility]   PHYSICIAN:  Creta Levin, M.D. [facility]      DATE OF BIRTH:  Aug 28, 1922   DATE OF ADMISSION:  05/09/2003  DATE OF DISCHARGE:                                HISTORY & PHYSICAL   CHIEF COMPLAINT:  Presyncope.   HISTORY OF PRESENT ILLNESS:  Mrs. Demarinis is a 77 year old woman with  congestive heart failure who recently underwent a biventricular ICD upgrade  on April 29, 2003, who presents with two episodes of presyncope.  She  describes her episodes as dizziness and lightheadedness lasting 30 seconds  to 1 minute.  There is possibly some associated dyspnea but no chest  discomfort.  There are also possibly some palpitations but she does not  clearly describe this.   PAST MEDICAL HISTORY:  1. Congestive heart failure which looks to be out of proportion with her     coronary disease and likely to be nonischemic.  2. Complete heart block.  3. Hypertension.  4. Hyperlipidemia.  5. Hypothyroidism.   ALLERGIES:  She has no known drug allergies.   CURRENT MEDICATIONS:  1. Coreg 12.5 mg q.12 h.  2. Lisinopril 10 mg daily.  3. Inspra 25 mg daily.  4. Lipitor 10 mg daily.  5. Xalatan eye drops.   SOCIAL HISTORY:  [place].  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  She did not smoke a  significant amount of tobacco in her life, nor did she drink a significant  amount of alcohol.   FAMILY HISTORY:  Her family history is positive only for some hypertension  in her mother and COPD in her father.  She has no siblings.   REVIEW OF SYSTEMS:  Her review of systems was positive for her baseline  shortness of breath and dyspnea on exertion.  She has had no chest  discomfort, orthopnea, PND, edema, claudication, or wheezing.   She did admit  to the presyncope, as mentioned in the HPI, as well as a cough that is  nonproductive and has been for a number of months.  The remainder of her 10-  point review of systems is negative except for some numbness in her fingers  during these episodes of presyncope.   PHYSICAL EXAMINATION:  VITAL SIGNS:  She is afebrile.  Her blood pressure is  112/48.  Her pulse was 94.  Her SAO2 was 99% on 2 L.  GENERAL:  In general, she is in no acute distress and a bit anxious.  HEENT:  HEENT reveals moist mucous membranes, good dentition.  NECK:  Her neck exam was supple.  She had no thyromegaly, JVD, or bruits.  CARDIOVASCULAR:  Her cardiovascular exam revealed a regular rate and rhythm  with  a normal S1, normal S2, no significant murmur or gallop.  The pacemaker  pocket looked to be clear, dry and intact; it was a bit tender, however.  LUNGS:  Her lungs were clear.  ABDOMEN:  Her abdominal exam was soft, nondistended, and nontender.  EXTREMITIES:  Her extremities reveal no cyanosis, clubbing or edema.  Her  distal pulses were strong.  Her neurological exam was nonfocal.   LABORATORY AND ACCESSORY CLINICAL DATA:  Chest x-ray revealed no airspace  disease.  The biventricular device was in the right hemithorax and the  coronary sinus lead appeared that it may have been 1 cm pulled back from  April 30, 2003.   The ECG revealed normal sinus rhythm with demand pacing and a left bundle  branch morphology.  This looks to be the same as it was back when the ICD  was originally implanted.   Labs thus far:  The hematocrit is 38, sodium 137, potassium 4, chloride 108,  BUN 23, creatinine 1, glucose 109.  She had a CK-MB of 4.6 and a troponin I  of 0.21.   ASSESSMENT AND PLAN:  1. Presyncope.  There definitely seems to be an anxiety component to her     presyncope.  This was witnessed by myself as well as the emergency     department staff, who personally witnessed two episodes.  During  these     episodes, the patient had normal pacemaker function on telemetry, but did     have a slight elevation in her heart rate to the 110 to 120 range.  The     blood pressure was not obtained during these episodes.  I did attempt     carotid sinus massage and did not appreciate any significant change in     her heart rate, nor did I elicit any symptoms other than some discomfort     from where I pressed.  At this point, I will bring her in for observation     to make sure that she does not have any pacemaker dysfunction or any     tachyarrhythmia that may be causing her symptoms of presyncope.  We will     evaluate the pacemaker in the morning and determine her clinical course     thereafter.  2. Positive troponin.  This is possibly a false-positive test.  The patient     really gives no story consistent with an acute coronary syndrome.  I will     recheck her cardiac biomarkers and continue cycling them to rule her out.     Given that she had a catheterization in June of 2004 revealing fairly     unimpressive coronary disease, there is probably no need for further     workup, unless her markers are floridly positive.  For now, we will     continue her on aspirin, Coreg, Lipitor, and lisinopril.  I am not adding     heparin unless her troponin does come back to be positive for a second     time, at which time we can reconsider.                                                Creta Levin, M.D. [facility]    RPK/MEDQ  D:  05/09/2003  T:  05/10/2003  Job:  [id number]

## 2010-10-06 NOTE — Discharge Summary (Signed)
NAME:  Tiffany [name], Tiffany [name]                            ACCOUNT NO.:  [id number]   MEDICAL RECORD NO.:  [id number]                   PATIENT TYPE:  INP   LOCATION:  3702                                 FACILITY:  [facility]   PHYSICIAN:  Darden Palmer., M.D.         DATE OF BIRTH:  05-25-1922   DATE OF ADMISSION:  05/19/2003  DATE OF DISCHARGE:  05/25/2003                                 DISCHARGE SUMMARY   FINAL DIAGNOSES:  1. Dizziness of uncertain etiology -- possibly due to recently implanted     pacemaker.  2. Nonischemic cardiomyopathy.  3. Hyperlipidemia.  4. Hypothyroidism.  5. History of hypertension.  6. Orthostatic hypotension.  7. Probable anxiety and depression.  8. Obesity.   PROCEDURES:  1. Echocardiogram.  2. Reprogramming of pacemaker.  3. CT scan of chest and carotid Doppler.   HISTORY:  [age]-year-old female with a known history of a  cardiomyopathy.  She had a permanent pacemaker implanted several years ago  for syncope and complete heart block and had torsade de pointes at that  time.  Recently, because of end of life on her battery, she upgraded to a  biventricular pacemaker and defibrillator on April 29, 2003.  At the time,  placement of the left ventricular lead was difficult and she has a  nonfunctioning left ventricular lead.  The right ventricular lead was placed  high on the septum and the previous right ventricular lead was capped off.  Previous catheterization showed only moderate LAD disease with mild  irregularities elsewhere.  She was readmitted to the hospital on May 09, 2003 with 2 episodes of presyncope described as dizziness and  lightheadedness, lasting 30 seconds to 1 minute with mild associated  dyspnea.  During that admission, she had minimal elevated troponins but the  rest of her enzymes were unremarkable.  She was recommended that she have an  outpatient Cardiolite but she refused to come back in to have this done.  She  then began to have recurrent episodes of dizziness described as a  sinking feeling that would come over her, described as a fullness in her  head, but often times she would fall and catch herself but sometimes would  have no recollection of the event.  They have always occurred when she is  lying or sitting down.  She came to the emergency room and was admitted at  that time for further evaluation.  Please see the previously dictated  history and physical for remainder of the details.   HOSPITAL COURSE:  Lab data shows a homocysteine of 11.37, TSH of 3.671.  Hemoglobin is 13, hematocrit 38.5, MCV is 100.  PTT and PT were normal.  Sed  rate was 18.  Sodium is 141, potassium 4.4, glucose 133, BUN 18, creatinine  1.  CPK and troponins were all normal.  C-reactive protein was 0.1.  B12  level was 824.  TSH was  2.884.   EKG showed a paced rhythm.   Chest x-ray shows cardiomegaly with no congestive heart failure.   CT scan of the head without contrast shows mild cerebral and cerebellar  atrophy.  She had chronic ischemic changes versus remote lacunar infarct in  the right subinsular region and left frontal white matter.   EEG was thought to be normal.   The patient was admitted to the hospital and had interrogation of her  pacemaker.  It was thought that she was very sensitive to testing and rate  variation.  Her thresholds were fine and she did not have significant high-  rate episodes and 100% A-sensed and V-paced with a lower rate of 50.  The  lower rate was changed eventually to 70.  She, while in the hospital,  continued to have episodes of dizziness and was seen by the neurologist.  A  2-D echocardiogram showed left ventricular dysfunction with dyssynchronous  movement of the septum.  She remained weak and tired with a flat affect and  was somewhat depressed.  She was somewhat orthostatic.  Because of her  orthostasis, her Inspra, lisinopril and Coreg were all held.  She was seen   in consultation by Dr. Duke Salvia, who also noted her to be somewhat  symptomatic.  He wondered whether they correlated with increased heart rate.  He also wondered whether the change in activation sequence with the  pacemaker had something to do with her symptoms.  Carotid Doppler studies  showed a right 46% stenosis, mid range, with no severe stenoses noted and no  internal carotid artery stenosis.  She continued to be observed and  continued to have dizziness.  It was also noted that she would lie in the  bed and would not ambulate and since her medicines were discontinued, Coreg  was restarted at 3.125 mg daily.  She then had Wenckebach phenomena at an  upper rate and had this similar feeling and wondered if this could have been  responsible for her symptoms.  We had a great Schue of difficulty getting her  to get up and ambulate but eventually with physical therapy and with the  cardiac rehab people, we were able to get her to ambulate.  With  reprogramming of her pacemaker to a rate of 70 and an upper rate of 130, she  had no recurrent symptoms and I elected to discharge her home.  She was  discharged home in improved condition on:   DISCHARGE MEDICATIONS:  1. A reduced dose of Coreg at 3.125 mg b.i.d.  2. Lipitor 10 mg daily.  3. Xalatan eye drops daily.  4. Synthroid 0.025 mg daily.  5. She is also going to be on Paxil 10 mg daily.   FOLLOWUP:  She is to follow up in one week and is to have a cardiac event  monitor placed if she has recurrent dizziness.                                                Darden Palmer., M.D.    WST/MEDQ  D:  05/25/2003  T:  05/25/2003  Job:  [id number]   cc:   Talmadge Coventry, M.D.  [address], [address]  [place]  [place] [address]  Fax: [phone]   Elgie Collard M.D.   Doylene Canning. Ladona Ridgel, M.D.  Carolanne Grumbling, M.D.  Fax: [phone]   Melvyn Novas, M.D.  [address]  [address]  [place]  [place] [address]  Fax: [phone]

## 2010-10-06 NOTE — Discharge Summary (Signed)
NAME:  Tiffany [name], UPHAM                            ACCOUNT NO.:  [id number]   MEDICAL RECORD NO.:  [id number]                   PATIENT TYPE:  INP   LOCATION:  3710                                 FACILITY:  [facility]   PHYSICIAN:  Doylene Canning. Ladona Ridgel, M.D.               DATE OF BIRTH:  07/08/1922   DATE OF ADMISSION:  09/06/2003  DATE OF DISCHARGE:  09/08/2003                                 DISCHARGE SUMMARY   PRIMARY DIAGNOSIS:  Dislodgment of her RV and LV leads of her pacemaker.   HISTORY OF PRESENT ILLNESS:  This 77 year old female, with a history of  congestive heart failure and complete heart block, who recently underwent a  Bi-V ICD upgrade in December 2004.  She presents with several episodes of  near syncope and presyncope with episodes of dizziness and lightheadedness  which will last between 30 seconds to a minute.  She has had one while in  the emergency room on the monitor and it appears that the pacemaker ceases  to function for periods as long as two minutes.  During this time she has  complete heart block with very slow ventricular response.  During that time  her blood pressure drops very low, and she looses consciousness.  Once the  pacemaker begins to function again, she immediately awakens and feels  normal.  The patient was admitted, and her pacemaker was interrogated.  She  was admitted for a Bi-V lead revision.  The patient went to the OR.  She had  revision of her RV IVCD lead as well as the LV lead was advanced.  She  tolerated the procedure well and was then discharged the following day in  stable condition.   She was discharged on all of her previous medications.  1. Lipitor 10 nightly.  2. Coreg 3.125 b.i.d.  3. Synthroid 50 mcg daily.  4. With the addition of Keflex 250 mg every eight hours for five days.  5. She was to take Tylenol 1-2 tabs every four to six hours as she needed it     for pain.   Activity and wound care were per device discharge sheet.  No  driving for two  weeks.  Low fat, low cholesterol, low salt diet.  She was to be seen at the  pacemaker clinic at [facility] on Sep 22, 2003 at 9:15, and Dr. Ladona Ridgel on January 05, 2004 at 9:40 for her device check.      Chinita Pester, C.R.N.P. [facility]                 Doylene Canning. Ladona Ridgel, M.D.    DS/MEDQ  D:  09/08/2003  T:  09/08/2003  Job:  [id number]   cc:   Doylene Canning. Ladona Ridgel, M.D.   Talmadge Coventry, M.D.  [address]  [place]  [place] [address]  Fax: [phone]   W. Ashley Royalty., M.D.  [address], [address]  [place]  [place] [address]  Fax: [phone]   [place], Dr.

## 2010-10-06 NOTE — Discharge Summary (Signed)
NAME:  Tiffany [name], Tiffany [name]                            ACCOUNT NO.:  [id number]   MEDICAL RECORD NO.:  [id number]                   PATIENT TYPE:  OBV   LOCATION:  4741                                 FACILITY:  [facility]   PHYSICIAN:  Duke Salvia, M.D.               DATE OF BIRTH:  12-15-1922   DATE OF ADMISSION:  05/10/2003  DATE OF DISCHARGE:  05/11/2003                                 DISCHARGE SUMMARY   PRIMARY DIAGNOSIS:  Presyncope.   HISTORY OF PRESENT ILLNESS:  This 77 year old female with a history of  ischemic cardiomyopathy, class III, who underwent a bi-V implantation on  April 29, 2003, who presented with two episodes of presyncope that she  describes as dizziness and lightheadedness that lasts 30 seconds to a  minute.  No chest discomfort.  Some palpitations.  The patient was admitted.  Her device was interrogated.  Upon admission, the patient's cardiac enzymes,  CK was 78, MB was 8.6, and troponin was 0.82.  H&H was 12.9 and 38.  Sodium  137, potassium 4, chloride 108, glucose 109, BUN 23, creatinine 1.  Second  set of enzymes showed a CK of 68, MB 6.1, troponin 0.62.  TSH was 2.029.  The abnormal enzymes were thought to be residual from defibrillator testing.  The device was interrogated.  The patient's recent bi-V ICD upgrade with  difficult LV lead placement, and subsequent failure of the LV capture.  Positive troponin with negative enzymes.  She had a negative spiral CAT scan  of the chest.  ICD interrogated.  Satisfactory RV threshold, but still  unable to LV capture, underlying complete heart block.  The patient was  continued to be watched on telemetry.  She was to be scheduled for an  outpatient Cardiolite with Dr. Donnie Aho.  The most likely explanation of  symptoms from admission was frequent symptoms of PVCs.  The patient  developed worsening CHF symptoms and redo revision of LV lead and an option  via right IJ approach with tunneling of the lead back to the old  pocket.  The patient had no further episodes.  The patient did have abnormal  troponin's and MBs without symptoms.  Negative CAT scan of the chest.  She  was discharged to home to follow up with Dr. Donnie Aho for an outpatient  adenosine Cardiolite.  She was discharged on all of her previous medications  - Coreg 12.5 q.12h., lisinopril 10 mg daily, Inspra 25 mg daily, Lipitor 10  mg daily, and Xalatan eye drops.   ACTIVITY:  No restrictions.   DIET:  Low fat, low salt, low cholesterol diet.   FOLLOWUP:  The patient was to call Dr. York Spaniel office to schedule a stress  test, and she was to follow up with Dr. Lewayne Bunting on August 04, 2003, at  11:10 a.m.   Please note also the patient had her Steri-Strips removed today.  Wound was  well approximated with no drainage or hematoma.      Chinita Pester, C.R.N.P. [facility]                 Duke Salvia, M.D.    DS/MEDQ  D:  05/11/2003  T:  05/12/2003  Job:  [id number]   cc:   Doylene Canning. Ladona Ridgel, M.D.   [facility] [employer] M.D.  [address]  [place]  [place] [address]  Fax: [phone]

## 2010-10-06 NOTE — H&P (Signed)
NAME:  Tiffany [name], Tiffany [name]                            ACCOUNT NO.:  [id number]   MEDICAL RECORD NO.:  [id number]                   PATIENT TYPE:  INP   LOCATION:  3729                                 FACILITY:  [facility]   PHYSICIAN:  Vida Roller, M.D.                DATE OF BIRTH:  1922/10/23   DATE OF ADMISSION:  09/06/2003  DATE OF DISCHARGE:                                HISTORY & PHYSICAL   PRIMARY CARE PHYSICIAN:  Dr. Smith Mince.   CARDIOLOGIST:  Dr. Donnie Aho.   ELECTROPHYSIOLOGIST:  Dr. Lewayne Bunting.   HISTORY OF PRESENT ILLNESS:  Tiffany [name] is a 77 year old woman with a history  of congestive heart failure and complete heart block who recently underwent  a biventricular ICD upgrade in December of 2004. She presents with several  episodes of near syncope/presyncope today with episodes of dizziness and  lightheadedness which will last between 30 seconds to a minute. She had one  while in the ER examining room on the monitor, and it appears that his  pacemaker ceases to function for periods as long as two minutes, and during  that time, she has complete heart block with a very slow ventricular  response. During that time, her blood pressure drops to very low levels, and  she looses consciousness. Once the pacemaker begins to function again, she  immediately awakens and feels normal.   PAST MEDICAL HISTORY:  Heart failure. She has nonischemic dilated  cardiomyopathy with an ejection fraction of 25%. She had a heart  catheterization performed in June of 2004 and has nonobstructive coronary  disease with only a 40 to 50% proximal stenosis in one of her diagonal  branches. At that time, she had an ejection fraction of 30%. She  subsequently had an echocardiogram which showed an ejection fraction of 25%.  She has a history of complete heart block. She is pacemaker dependent, got  her first pacemaker in 1996, was recently upgraded to a BiV ICD [employer] by  Dr. Lewayne Bunting. She has had  multiple admissions for syncope/presyncope in  the past and has had to have adjustments made to her pacemakers made during  those times. She also has hypertension, hyperlipidemia, and hypothyroidism.   ALLERGIES:  She is not allergic to any medications.   CURRENT MEDICATIONS:  1. Coreg 6.25 mg q.12h.  2. Lisinopril 10 mg a day.  3. Lipitor 10 mg a day.  4. She takes Xalatan eye drops.   SOCIAL HISTORY:  She lives in [place] by herself. She has formally worked  as Scientist, physiological for a Education officer, community. She does not smoke. She does not drink any  alcohol. She has several sons who are very involved in her health care and  one who presented with her to the ER, and I was able to interview.   FAMILY HISTORY:  She does have hypertension in her family, but there is no  significant  coronary artery disease. She has no siblings. Her mother and her  father both had COPD.   REVIEW OF SYSTEMS:  Essentially negative except for that reviewed in the  history of present illness.   PHYSICAL EXAMINATION:  GENERAL:  She is a well-developed, well-nourished,  elderly white female in no apparent distress. She is alert and oriented x4.  VITAL SIGNS:  Heart rate is 94 paced. Her blood pressure is 160/80. Her O2  saturations are 99% on 2 liters nasal cannula.  HEENT:  Unremarkable.  NECK:  Supple. She has no jugular venous distention or carotid bruits.  CHEST:  Clear to auscultation bilaterally.  CARDIAC:  Reveals a displaced laterally inferiorly point of maximum impulse  with no lifts or thrills. Her pacemaker pocket appears to be clean and dry  and intact without any significant induration.  ABDOMEN:  Soft, nontender, normal active bowel sounds  EXTREMITIES:  Lower extremities are without significant clubbing, cyanosis,  or edema. Her pulses were 1+ throughout.   STUDIES:  Her chest x-ray which was performed earlier today-she was seen in  the ER reasonably early today-shows the pacemaker in the upper left  chest  with leads that appear to be in the appropriate area with no evidence of  lead injury. Her laboratories are all pending. Her electrocardiogram shows  paced rhythm and a left bundle branch block configuration. It appears that  she A sensing and V pacing.   ASSESSMENT:  1. This is a woman with a nonischemic dilated cardiomyopathy with an     ejection fraction of 25%. She is in complete heart block and is pacemaker     dependent who has had several episodes of near syncope which appear to be     related to pacemaker failure, and the strips show this.  2. Hypertension.  3. Hyperlipidemia.  4. Hypothyroidism.   PLAN:  My plan is to admit her to telemetry to place pacer pads on her for  observation. I have called Mr. Emelda Fear who is the [employer] rep. He is  going to come out tomorrow morning and interrogate her pacemaker. In the  meantime, I will cycle her enzymes and ask the electrophysiology people to  see her in the morning.                                                Vida Roller, M.D.    JH/MEDQ  D:  09/07/2003  T:  09/07/2003  Job:  [id number]

## 2010-10-06 NOTE — Procedures (Signed)
PROCEDURE:  This is a 17 channel EEG with 1 channel devoted to EKG,  utilizing the International lead placement system.   The patient is described clinically as being awake and drowsy.  Electrographically, she appears to be awake, drowsy, and briefly in the  light natural sleep stages. The study is being done to rule out seizure. The  patient is experiencing brief spells.   While awake, the background consisted of a well organized, well developed,  well modulated 9 to 10 Hz alpha activity, just predominant in the posterior  head regions and briskly reactive to eye opening. No inner hemispheric  asymmetry is identified and no definite epileptiform discharges are seen.  Photic stimulation did not produce significant change in the background  activity. Hyperventilation was not performed. During drowsiness, there is  attenuation in the background with decrease in frequency and amplitude with  some theta activity seen. During light natural sleep some beta activity is  noted with a spindle formation. The EKG monitor reveals relatively regular  rhythm with wide QRS complexes at the rate of 78 beats per minute.   CONCLUSION:  Normal EEG in the waking drowsy and light natural sleep states  without seizure-like activity or focal abnormality seen during the course of  the days recording. Clinical correlation is recommended.    Tyler Deis, M.D.   WUJ:WJXB  D:  30/04/2003 14:29:46  T:  30/04/2003 15:02:12  Job #:  [id number]

## 2010-10-06 NOTE — Cardiovascular Report (Signed)
   NAME:  Tiffany [name], Tiffany [name]                            ACCOUNT NO.:  [id number]   MEDICAL RECORD NO.:  [id number]                   PATIENT TYPE:  OIB   LOCATION:  NA                                   FACILITY:  [facility]   PHYSICIAN:  W. Ashley Royalty., M.D.         DATE OF BIRTH:  May 11, 1923   DATE OF PROCEDURE:  10/20/2002  DATE OF DISCHARGE:                              CARDIAC CATHETERIZATION   HISTORY:  A 77 year old female with a cardiomyopathy and an abnormal  Cardiolite scan.  Study is done to evaluate for possible coronary disease.   PROCEDURE:  Left heart catheterization with coronary angiograms and left  ventriculogram.   PREMEDICATIONS:  Valium 10 mg.   MEDICATIONS GIVEN DURING THE PROCEDURE:  Versed 2 mg IV for sedation.   COMMENTS:  She tolerated the procedure well without complications.  Had good  peripheral pulses at the end of the procedure.   HEMODYNAMIC DATA:  1. Aorta post contrast 166/66.  2. LV post contrast 166/13-20.   ANGIOGRAPHIC DATA:  Left ventriculogram:  Performed in the 30 degree RAO  projection.  The aortic valve was normal.  The mitral valve was normal.  The  left ventricle appeared mildly dilated.  There is permanent dual chamber  pacemaker noted.  There is dyssynchronous contractility pattern of LV with  an estimated ejection fraction of approximately 30%.  Coronary arteries  arise _______ normally.  Minimal calcification is noted.  Left main coronary  artery is short.  Left anterior descending has an eccentric 40-50% proximal  stenosis prior to the diagonal branch.  The circumflex coronary artery has a  single large marginal artery and is free of disease.  Right coronary artery  has a tortuous posterior descending artery and a posterolateral branch which  are also free of disease.   IMPRESSION:  1. Dilated left ventricle with hypokinesis compatible with cardiomyopathy.  2. Moderate coronary artery disease mostly involving the left anterior  descending with minimal disease elsewhere.  3. Permanent pacemaker.   RECOMMENDATIONS:  Treat intensively for cardiomyopathy.                                                Darden Palmer., M.D.    WST/MEDQ  D:  10/20/2002  T:  10/20/2002  Job:  [id number]

## 2010-10-06 NOTE — Telephone Encounter (Signed)
escribe medication per fax request  

## 2010-10-06 NOTE — Op Note (Signed)
NAME:  Tiffany [name], Tiffany [name]                            ACCOUNT NO.:  [id number]   MEDICAL RECORD NO.:  [id number]                   PATIENT TYPE:  OIB   LOCATION:  3730                                 FACILITY:  [facility]   PHYSICIAN:  Doylene Canning. Ladona Ridgel, M.D.               DATE OF BIRTH:  20-Feb-1923   DATE OF PROCEDURE:  04/30/2003  DATE OF DISCHARGE:                                 OPERATIVE REPORT   PROCEDURE PERFORMED:  Defibrillation threshold testing. This procedure was  done in conjunction with the previous  [employer]onic biventricular ICD upgrade.   INTRODUCTION:  The patient is a very pleasant 77 year old woman with a  history of  ischemic cardiomyopathy, severe left ventricular dysfunction,  class 3 heart failure and a left bundle branch block paced QRS morphology.  She underwent biventricular ICD insertion on April 29, 2003. Because of  the prolonged length and duration of the procedure,  defibrillation  threshold testing was not carried out and she is now referred for  defibrillation threshold testing.   DESCRIPTION OF PROCEDURE:  After informed consent was obtained the patient  was taken to the diagnostic catheterization laboratory  in a fasting state.  After the usual prepping and draping with Fentanyl and Versed, VF was  induced with a T-wave shock. A 40 joule shock promptly terminated  ventricular fibrillation, restoring sinus rhythm. Five minutes was allowed  to elapse and a 2nd DFT test was carried out. Again VF was induced with a T-  wave shock and a 14 joule shock, delivered, terminating ventricular  fibrillation. At this point the patient was allowed to return to her room in  satisfactory condition.   COMPLICATIONS:  There were no immediate procedure complications.   RESULTS:  This demonstrates successful defibrillation threshold testing in a  patient with a previously implanted Medtronic IV ICD.                                               Doylene Canning. Ladona Ridgel, M.D.    GWT/MEDQ  D:  05/01/2003  T:  05/01/2003  Job:  [id number]

## 2010-10-06 NOTE — Discharge Summary (Signed)
NAME:  Tiffany [name], Tiffany [name]                            ACCOUNT NO.:  [id number]   MEDICAL RECORD NO.:  [id number]                   PATIENT TYPE:  INP   LOCATION:  5529                                 FACILITY:  [facility]   PHYSICIAN:  Elliot Cousin, M.D.                 DATE OF BIRTH:  1923-03-07   DATE OF ADMISSION:  09/21/2003  DATE OF DISCHARGE:  09/24/2003                                 DISCHARGE SUMMARY   PRIMARY DISCHARGE DIAGNOSES:  1. Recurrent dizziness/disequilibrium.  2. Incidental finding of mild cervical lymphadenopathy on CT scan of the     neck.   SECONDARY DISCHARGE DIAGNOSES:  1. Nonischemic cardiomyopathy with an ejection fraction 15 to 20%.  2. Status post biventricular dual-chamber implantable cardioverter-     defibrillator in the past.  The upgrade was performed in December 2004     with the procedure complicated by difficult left ventricular lead access.  3. Status post implantable cardioverter-defibrillator revision in April     2005.  4. Implantable cardioverter-defibrillator was inserted secondary to history     of complete heart block.  5. Hypothyroidism.  6. Hyperlipidemia.  7. Hypertension.  8. Glaucoma.  9. Osteoporosis.  10.      Recent episode of bronchitis.   DISCHARGE MEDICATIONS:  1. Coreg 3.125 mg b.i.d.  2. Lipitor 10 mg q.h.s.  3. Synthroid 50 mcg daily.  4. Xalatan eye drops as previously directed.   DISCHARGE DISPOSITION:  The patient was discharged to home on Sep 24, 2003 in  improved and stable condition.  She has a hospital follow-up appointment  with Dr. Ladona Ridgel on Oct 11, 2003 at 2:00 p.m.  She was advised to follow-up  with her primary care physician, Dr. Smith Mince, in one week.   CONSULTATIONS:  Dr. Lewayne Bunting.   PROCEDURES PERFORMED:  1. CT scan of the head without contrast - The results revealed mild atrophy     and mild chronic small vessel disease with no acute intracranial     abnormality.  2. CT scan of the neck with contrast  - The results revealed scattered     bilateral borderline sized lymph nodes.  These could be reactive.  Follow-     up CT in three to six months would be helpful to assure stability.   HISTORY OF PRESENT ILLNESS:  The patient is a 77 year old lady with a  history of complete heart block and dilated cardiomyopathy with an estimated  ejection fraction of 15 to 20%.  The patient underwent ICD placement several  years ago secondary to the complete heart block.  She underwent an upgrade  of the biventricular dual-chamber ICD back in September 2004 and again in  April 2005 after she presented to the hospital with a frank syncopal  episode.  The ICD was revised per Dr. Lewayne Bunting and was functioning  without any abnormalities at the time of  hospital discharge in April of  2005.  When the patient presented to the emergency department she complained  of a two day history of generalized dizziness and heart palpitations.  The  patient felt as if she were going to pass out, but she did not.  She was  recently treated with Avelox and Phenergan with codeine for acute bronchitis  by her primary care physician, Dr. Smith Mince, several days ago.  The patient  states that her bronchitis symptoms have resolved.  When the patient was  evaluated in the emergency department, she was hemodynamically stable.  Given her recent history of ICD revision, the patient was admitted for  further evaluation and management.   HOSPITAL COURSE:  RECURRENT DIZZINESS/DISEQUILIBRIUM - The initial  evaluation started in the emergency department when the patient was sent for  a CT scan of her head and neck.  The patient did become very dizzy in the  emergency department when she was asked to shake her head from side to side.  The CT scan of the head revealed no acute abnormalities.  The CT scan of the  neck revealed no acute abnormalities, although there were findings  consistent with mild cervical lymphadenopathy.  The  radiologist recommended  a follow-up CT scan of the neck to assure stability.  The patient's EKG  revealed a paced rhythm.  The patient had no complaints of chest pain.  Therefore cardiac markers were not ordered.  Her laboratory results were  unremarkable.  She was further evaluated by assessment of her carotid  arteries, vitamin B12, folate and RPR.  The carotid Doppler study was  negative for ICA stenosis bilaterally.  The vertebral artery flow was  antegrade.  The vitamin B12 was elevated at 1,037, the serum folate was  within normal limits at 18.4, and the RPR was nonreactive.  Dr. Ladona Ridgel was  consulted to evaluate the patient's ICD.  Per Dr. Lubertha Basque assessment, the  patient had a stable implantable cardioverter-defibrillator lead function  without evidence of ventricular arrhythmias with interrogation of the  device.  There was currently no evidence of defibrillator dysfunction and  her leads were working satisfactorily.   Orthostatic vital signs were ordered for further evaluation.  The patient  had only very mild borderline changes of her blood pressure when she stood  up.  The cause of the patient's recurrent dizziness and disequilibrium is  uncertain.  It may be inner ear dysfunction and/or labyrinthitis.  The  patient was evaluated by physical therapy.  She ambulated with only mild  unsteadiness.  The physical therapist felt that the patient needed no  further inpatient physical therapy.  The patient was discharged to home by  Dr. Angelena Sole on Sep 24, 2003.  The patient appeared to be in stable and  improved condition.                                                Elliot Cousin, M.D.    DF/MEDQ  D:  10/13/2003  T:  10/14/2003  Job:  [id number]   cc:   Talmadge Coventry, M.D.  [address]  [place]  [place] [address]  Fax: [phone]   Doylene Canning. Ladona Ridgel, M.D.   Darden Palmer., M.D. [address]  [place]  [place] [address]  Fax: [phone]

## 2010-10-06 NOTE — Op Note (Signed)
NAMENICLE, CONNOLE                  ACCOUNT NO.:  [id number]   MEDICAL RECORD NO.:  [id number]          PATIENT TYPE:  INP   LOCATION:  NA                           FACILITY:  [facility]   PHYSICIAN:  Tiffany Canning. Ladona Ridgel, MD    DATE OF BIRTH:  07-05-1922   DATE OF PROCEDURE:  04/26/2006  DATE OF DISCHARGE:                               OPERATIVE REPORT   Tiffany [name] returns today for ICD generator change.   HISTORY OF PRESENT ILLNESS:  The patient is a 77 year old woman with a  history of an ischemic cardiomyopathy, VT, and complete heart block and  congestive heart failure, who underwent bi-V ICD implantation back in  2004.  Her battery had reached ERI and she is now referred for generator  change.   PROCEDURE:  After informed was obtained, the patient was taken to the  diagnostic EP lab in the fasting state.  After the usual preparation and  draping, intravenous fentanyl and midazolam were given for sedation.  Lidocaine 30 mL was infiltrated into the right infraclavicular region.  A 7 cm incision was carried out over this region and electrocautery  utilized to dissect down to the fascial plane.  Electrocautery was then  utilized to remove the defibrillator from its pocket.  Of note, care was  taken not to inhibit pacing during electrocautery.  The old Medtronic bi-  V defibrillator was removed without difficulty.  The left ventricular  lead was detached and pacing was commenced.  The LV threshold was 1.1  volts at 0.5 msec through the analyzer.  The impedance was 723 ohms.  The LV waves measured 6 mV.  The pacing was left in the LV and the RV  and RA leads were removed from the defibrillator.  R-waves measured 5  mV, the pacing impedance was 482 Ohms, and the threshold was 1.6 V at  0.5 msec (stable).  Ten-volt pacing did no stimulate the diaphragm.  Attention was then turned to the atrial lead, which had a P wave of 1.5-  2 mV and the pacing impedance there 441 ohms and threshold was 1.2 V  at  0.5 msec.  With all the above findings, the new Medtronic InSync Sentry  bi-V ICD, model 7299, was then connected to the atrial, RV and LV leads  and placed back in the subcutaneous pocket.  The serial number of the  defibrillator was [id number].  Additional kanamycin was then utilized to  irrigate the pocket.  The incision was closed with a layer of 2-0 Vicryl  followed by layer of 3-0 Vicryl.  Benzoin painted on skin, Steri-Strips  were applied, and defibrillation threshold testing carried out.   After the patient was more deeply sedated with fentanyl, Valium and  Versed, VF was induced with a T-wave shock and a 15-joule shock was  initially delivered, which terminated VF and restored sinus rhythm.  No  additional defibrillation threshold testing was carried out, and the  incision was closed with a layer of 2-0 Vicryl, followed by layer of 3-0  Vicryl, followed by a layer of 4-0 Vicryl.  Benzoin was painted on the  skin and Steri-Strips were applied and a pressure dressing was placed,  and the patient was returned to her room in satisfactory condition.   COMPLICATIONS:  There were no immediate procedure complications.   RESULTS:  This demonstrate successful removal of a previously-implanted  bi-V ICD in a patient with complete heart block and insertion of a new  device without immediate procedure complication.      Tiffany Canning. Ladona Ridgel, MD  Electronically Signed     GWT/MEDQ  D:  04/26/2006  T:  04/26/2006  Job:  [id number]   cc:   Peter M. [place], M.D.

## 2010-10-06 NOTE — Op Note (Signed)
NAME:  Tiffany [name], Tiffany [name]                            ACCOUNT NO.:  [id number]   MEDICAL RECORD NO.:  [id number]                   PATIENT TYPE:  OIB   LOCATION:  3730                                 FACILITY:  [facility]   PHYSICIAN:  Doylene Canning. Ladona Ridgel, M.D.               DATE OF BIRTH:  06-03-1922   DATE OF PROCEDURE:  04/29/2003  DATE OF DISCHARGE:  05/01/2003                                 OPERATIVE REPORT   PROCEDURE PERFORMED:  Attempted upgrade to a BiV/ICD system in a patient  with a previously implanted dual-chamber pacemaker for complete heart block.   INTRODUCTION:  The patient is a 76 year old woman with a history of coronary  artery disease status post MI and a history of LV dysfunction.  Her LV  dysfunction has progressively worsened.  She has a history of complete heart  block and is status post permanent pacemaker insertion. She is now referred  because of a wide Q-R-S rhythm, class III heart failure, for a BiV/ICD  upgrade.   PROCEDURE:  After informed consent was obtained, the patient was taken to  the diagnostic EP Lab in the fasting state.  After the usual preparation and  draping, a total of 30 cc of lidocaine was infiltrated into the right  clavicular region.  Her old pacemaker incision site was incised, and a 9-cm  incision carried out over this region.  Electrocautery was utilized to  dissect down to the fascial plane.  The right subclavian vein was punctured  x2.  Of note, access to the right subclavian vein was made more difficult by  her previous pacemaker leads and device laying over the vein and secondary  to a very short area of vein between the clavicle and the first rib being  available to puncture.  This, however, was done successfully, and the  Medtronic Model [id number] cm ICD lead Serial #[id number] was advanced into the  right ventricle and placed on the RV septum.  Mapping was carried out at  this site, and once the lead was actively fixed, the R waves measured  22 mV  (paced), and the pacing threshold was 0.4 volts at 0.5 msec with a pacing  impedance of 727 ohms.  The 10 volt pacing did not stimulate the diaphragm.  With the right ventricular defibrillation lead in satisfactory position,  attention was then turned to the placement of the coronary sinus lead.  The  coronary sinus lead was placed after a Guidant catheter was utilized and  advanced into the coronary sinus ostium.  Venography was carried out.  This  demonstrated a middle cardiac vein which ran right along the direction of  the defibrillation lead and was not far from it on fluoroscopy.  Venography  also demonstrated one lateral vein.  Interestingly, multiple venography  injections were made and shots taken to determine the location of this vein  as it came off in  a co-axial manner with the coronary sinus.  The vein  actually appeared to come off the sinus at approximately 4 o'clock in the  LAO projection; however, in fact, the vein bifurcated from the coronary  sinus at approximately 6 o'clock in the LAO projection.  Eventually, the  vein was cannulated and venography of the vein carried out.  The Medtronic  Model [id number] 78-cm passive fixation LV pacing lead Serial #[id number] was  advanced into the vein, and pacing was carried out.  Initially, the pacing  threshold was 3 volts at 0.5 msec.  Attempts to advance the lead out further  into the vein were unsuccessful secondary to diaphragmatic stimulation.  It  should be noted that the venous anatomy was quite tortuous.  At this point,  the Medtronic InSync II [facility] 7289 dual-chamber biventricular ICD,  Serial [id number] was connected to the atrial, new defibrillation lead, and  LV lead.  The device was placed in the subcutaneous pocket.  Despite any  minimal movement of the left ventricular lead, however, the pacing could not  be accomplished of the left ventricular lead.  At this point, the  angioplastic guide wire was  advanced out into the vein, and mapping of the  vein was carried out with multiple pacing threshold attempts from a position  between two thirds to one third from base to apex on this lateral vein.  At  a site approximately one third to one fourth into the vein, the pacing  threshold was again 3 volts at 0.5 msec, and at this point, the lead was  connected to the defibrillator, and because of the long duration of the  procedure, the incision was irrigated with Kanamycin.  Hemostasis was  assured with electrocautery, and the incision closed with a layer of 2-0  Vicryl followed by a layer of 3-0 Vicryl followed by a layer of 4-0 Vicryl.  Benzoin was painted on the skin.  Steri-Strips were applied, a pressure  dressing placed, and the patient returned to her room in satisfactory  condition.   COMPLICATIONS:  There were no immediate procedural complications.   RESULTS:  This demonstrates successful implantation of a Medtronic  biventricular ICD in a patient with class III heart failure, left bundle  branch block, and an ischemic cardiomyopathy with an ejection fraction of  25%.  Defibrillation threshold testing was not initially carried out at the  initial implant.  Another dictation note will be dictated as to the results  of this.                                               Doylene Canning. Ladona Ridgel, M.D.    GWT/MEDQ  D:  05/01/2003  T:  05/01/2003  Job:  [id number]   cc:   W. Ashley Royalty., M.D.  [address]  [place]  [place] [address]  Fax: [phone]   Kathrine Cords, R.N. [facility]

## 2010-10-06 NOTE — Assessment & Plan Note (Signed)
[facility]                         ELECTROPHYSIOLOGY OFFICE NOTE   ELDONNA, NEUENFELDT                         MRN:          [id number]  DATE:04/18/2006                            DOB:          Nov 12, 1922    Tiffany [name] returns today for followup.  She is a very pleasant elderly  woman with a history of an ischemic cardiomyopathy and congestive heart  failure and complete heart block, who is status post BiV ICD  implantation back in December of 2004.  The patient returns today for  followup.  She initially had had problems with her leads, but these were  finally rectified with lead revision carried out back in April of 2005.  She continues to be quite anxious and has class II heart failure  symptoms.  Otherwise, she had no specific complaints.   PHYSICAL EXAM:  She is a pleasant middle-aged woman in no acute  distress.  The blood pressure today was 148/77, the pulse was 76 and regular, the  respirations were 18 and the weight was 188 pounds.  NECK:  No jugular venous distention.  LUNGS:  Clear bilaterally to auscultation.  CARDIOVASCULAR:  Regular rate and rhythm with normal S1 and S2.  EXTREMITIES:  Demonstrated trace peripheral edema.   EKG:  Demonstrates P-synchronous biventricular pacing.   Interrogation of her defibrillator demonstrates a Medtronic InSync III  with a battery voltage of 2.62 volts.  We did not interrogate her device  today because of problems in the past with severe symptoms with pauses.   IMPRESSION:  1. Nonischemic cardiomyopathy.  2. Congestive heart failure.  3. Status post biventricular implantable cardioverter-defibrillator      implantation.   DISCUSSION:  Overall, Ms. Emry is stable.  Her defibrillator needs to be  changed out.  This will be scheduled at the earliest possible convenient  time.     Doylene Canning. Ladona Ridgel, MD  Electronically Signed    GWT/MedQ  DD: 04/18/2006  DT: 04/19/2006  Job #: [id number]   cc:    Peter M. [place], M.D.

## 2010-10-06 NOTE — Discharge Summary (Signed)
NAMEVIKTORIYA, [name]                  ACCOUNT NO.:  [id number]   MEDICAL RECORD NO.:  [id number]          PATIENT TYPE:  INP   LOCATION:  NA                           FACILITY:  [facility]   PHYSICIAN:  Doylene Canning. Ladona Ridgel, MD    DATE OF BIRTH:  May 12, 1923   DATE OF ADMISSION:  04/26/2006  DATE OF DISCHARGE:  04/26/2006                               DISCHARGE SUMMARY   The patient has no known drug allergies.   PRINCIPAL DIAGNOSIS:  Explantation of existing Medtronic cardioverter-  defibrillator generator with implant of Medtronic InSync Sentry [id number]  cardioverter defibrillator generator without complications,  Dr. Lewayne Bunting.   SECONDARY DIAGNOSES:  1. Nonischemic cardiomyopathy with ejection fraction about 25% on      echocardiogram June 2004.  2. Implant of permanent pacemaker in 1996 secondary to high-grade      heart block, complicated by torsade de pointes.  3. Upgrade of pacemaker to [facility] cardioverter defibrillator 2004.  4. Lead revisions April 2005.  5. Implanted December 2004 was a Medtronic InSync III.  6. Congestive heart failure; NYHA class II chronic systolic heart      failure.  7. Dyslipidemia.  8. Treated hypothyroidism.  9. Obesity.  10.Osteoporosis.  11.Macular degeneration/cataracts.   PROCEDURE:  April 26, 2006, generator change out with implantation of  Medtronic InSync Sentry [id number] cardioverter defibrillator.  Defibrillator  threshold study less than or equal to 15 joules; acceptable AV pacing  and sensing, Dr. Ladona Ridgel.   BRIEF HISTORY:  Tiffany [name] is an 77 year old female.  She has nonischemic  cardiomyopathy.  She has BiV ICD in place which was put there in  December 2000.  The patient's defibrillator is at elective replacement  indicator on interrogation at office visit April 18, 2006.  This will  be scheduled at the earliest possible time for change out.   HOSPITAL COURSE:  The patient presented April 26, 2006.  She underwent  explantation of her  existing device with implant of a new generator, a  Geophysicist/field seismologist.  She was discharged the same day.  She was  asked to remove the bandage on the morning of Saturday, December 8, and  leave the incision open to the air.  She is to keep the incision dry for  the next 7 days and to sponge bathe until Friday December 14.  She was  asked not to lift anything heavier than 10 pounds for next 2 weeks.  She  goes home with a prescription for Keflex 500 mg 1 tablet 1/2 hour before  breakfast, lunch, dinner and bedtime.  This is a new medication for 5  days.  She resumes her regular medications which include:  1. Coreg 12.5 mg twice daily.  2. Synthroid 50 mcg daily.  3. Atacand 32 mg daily.  4. Xalatan ophthalmic solution as before this procedure.  5. Enteric-coated aspirin 325 mg daily.  6. Multivitamin daily.  7. Tramadol 50 mg as needed.   The patient will follow up at [facility] [address] Monday December 31 at 9:40 for incision  check.   LABORATORY STUDIES:  Pertinent for this admission were taken April 18, 2006.  Complete blood count:  White cells 7.9, hemoglobin 12.7,  hematocrit 37, platelets 216.  Protime 11.8, INR 0.9.  Sodium 137,  potassium 4.1, chloride 100, carbonate 29, BUN is 15, creatinine 1,  glucose is 112.   Of note, Tiffany [name] does have the Medtronic [id number] active fixation dual coil  defibrillator lead in place.      Maple Mirza, PA      Doylene Canning. Ladona Ridgel, MD  Electronically Signed    GM/MEDQ  D:  04/26/2006  T:  04/27/2006  Job:  [id number]

## 2010-10-06 NOTE — Consult Note (Signed)
NAME:  Tiffany [name], Tiffany [name]                            ACCOUNT NO.:  [id number]   MEDICAL RECORD NO.:  [id number]                   PATIENT TYPE:  INP   LOCATION:  3702                                 FACILITY:  [facility]   PHYSICIAN:  Duke Salvia, M.D.               DATE OF BIRTH:  [birth year]   DATE OF CONSULTATION:  05/20/2003  DATE OF DISCHARGE:                                   CONSULTATION   HISTORY:  The patient is a 77 year old lady with a history of cardiomyopathy  which is in large part, nonischemic as best I can gather.  There are  references to an MI in the past.  She does have an abnormal Cardiolite  perfusion that upon catheterization in May 2004, demonstrated no significant  coronary artery disease.   She has a history of complete heart block.  At the time of her device change-  out, because of ventricular pacing, poor ejection fraction, poor functional  status, Bi-V device implantation was undertaken.  This was complicated by  extremely difficult placement of an LV lead which subsequently retracting so  that it was non capturing.  Furthermore, the RV pacing was changed from the  apical location via the older pacemaker lead, to the septal outflow tract  location with her defibrillator coil.  I currently do not know the status of  the RV pacing lead.   Since that time, she has done terribly per Dr. Donnie Aho.  She has had  significant fatigue, exercise intolerance, and is having periodic spells  that last only minutes, occur frequently while she is lying down and turning  onto her left side, where she feels that life is just draining out of her  system.   She came to the emergency room.  She currently had one of these episodes  while downstairs.  Nothing specific is noted on the monitor, although the  heart rate was noted to be 100 or so.  Electrocardiogram around the time did  state heart rate of about 110 (see below).   PAST MEDICAL HISTORY:  Notable for hyperlipidemia,  obesity, hypothyroidism,  osteoporosis.   She also has a history of  torsade de pointes although I do not have that  information history.   FAMILY HISTORY:  Noncontributory.   SOCIAL HISTORY:  Also noncontributory.  She lives alone.  She is widowed.  She has children who are involved in her car.   MEDICATIONS:  1. Coreg 12.5.  2. Lisinopril 10.  3. Inspra 25.  4. Lipitor 10.  5. Synthroid.   ALLERGIES:  She had no known drug allergies.   REVIEW OF SYSTEMS:  As noted in Dr. York Spaniel intake note, and is not  repeated here.   PHYSICAL EXAMINATION:  GENERAL:  She is an elderly, Caucasian female, in no  acute distress.  VITAL SIGNS:  Blood pressure 93/53 this morning.  It has ranged up  and down  in the 80 to 120 range systolic.  Pulse was 78 and regular.  HEENT:  Demonstrated no icterus or xanthomata.  Neck veins were 78 cm.  Carotids were brisk.  BACK:  Without kyphosis or scoliosis.  LUNGS:  Clear.  HEART:  PMI was displaced with a diminished S1 and normal S2.  No  significant murmurs were appreciated.  ABDOMEN:  Soft with active bowel sounds without midline pulsation.  PULSES:  Femoral pulses were 2+, distal pulses were intact.  EXTREMITIES:  There was no cyanosis, clubbing or edema.  NEUROLOGIC:  Exam was grossly normal.  SKIN:  Warm and dry.   STUDIES:  Electrocardiogram demonstrated paced synchronous pacing with rates  ranging from 87 to 115.  Review of her telemetry demonstrated that there  were a number of episodes where her heart rate changed abruptly from a cycle  length of about 630 to a cycle length of 510.  In addition, there are  episodes of upper rate Wenckebach behavior.   IMPRESSION:  1. Spells, question correlation with increased heart rate.  2. Complete heart block, status post pacemaker with evidence of     A. Upper rate Wenckebach behavior.     B. Abrupt changes to the atrial cycle length from 670 to 560.  3. Cardiomyopathy, apparently primarily  nonischemic with Class III     congestive heart failure and ejection fraction of 25 to 30%.  4. Status post ICD for #2, #3 and #6.  5. Marked deterioration in functional status since implantation of the RV     septal pacing lead, not withstanding the dislocation of the LV pacing     lead which has subsequently been turned off.   DISCUSSION:  There are a couple of issues.  The first is whether her  deteriorating status since RV septal pacing is related to RV septal pacing  versus RV apical pacing.  In the event that her RV apical lead was not cut  at the time of implantation, this could be put into the LV pacing port which  is not utilized currently because of the dislocation of the LV pacing lead.  At that point, we could try various RV pacing combinations, i.e. septal plus  apex or septal long and look at dyssynchrony and see which of these may  modify these parameters to the greatest extent.  In addition, these spells  that she had do not have a clear explanation.  The view of the telemetry  suggests that these may be related to arrhythmia.  I have instructed the  patient in the use of her activator to see if we can identify or correlate  with this.   I will review the chest x-ray to see if there is any evidence that the  ventricular lead was cut at the time of the last procedure.   In the event that it was not, we will plan to reconnect her leads on Monday.                                               Duke Salvia, M.D.    SCK/MEDQ  D:  05/20/2003  T:  05/21/2003  Job:  [id number]   cc:   Darden Palmer., M.D.  [address]  [place]  [place] [address]  Fax: [phone]

## 2010-10-06 NOTE — Discharge Summary (Signed)
NAME:  Tiffany [name], Tiffany [name]                            ACCOUNT NO.:  [id number]   MEDICAL RECORD NO.:  [id number]                   PATIENT TYPE:  INP   LOCATION:  3730                                 FACILITY:  [facility]   PHYSICIAN:  Doylene Canning. Ladona Ridgel, M.D.               DATE OF BIRTH:  1923-02-26   DATE OF ADMISSION:  04/29/2003  DATE OF DISCHARGE:  05/01/2003                                 DISCHARGE SUMMARY   PRIMARY DIAGNOSIS:  Congestive heart failure.   SECONDARY DIAGNOSES:  1. Ischemic heart disease.  2. Left bundle branch block.   HISTORY OF PRESENT ILLNESS:  This is a 77 year old female who underwent  pacemaker insertion in 1996 secondary to high grade AV block with Torsades  de point associated with underlying bradycardia. She has done well from a  pacemaker perspective during that period of time but was recently found to  be at [facility]. The patient has had a long history of progressive dyspnea with  exertion and left ventricular dysfunction. Most recent echocardiogram back  in June of 2004 which demonstrates severe LV function with an EF of 25%.  Also a Cardiolite which demonstrated no ischemia but severe LV dysfunction.  The patient was treated maximally with ACE inhibitors, beta blockers, and  Inspra.  Despite this, she continued to have Class III heart failure.  Fortunately, she has been able to not acquire hospitalization for volume  overload. The patient was admitted and underwent placement of a BiV ICD. The  LV lead was unable to be captured in a posterior vein with chest x-ray still  showing proper position. The patient had a lengthy procedure and underwent  DFT checking the following day secondary to lengthy procedure. DFTs were  approximately 14 joules.   DISCHARGE MEDICATIONS:  She was then discharged to home in stable condition  on her previous medications:  1. Coreg 12.5 b.i.d.  2. Prinivil 10 daily.  3. Lipitor 10 nightly.  4. Inspra 25 daily.  5. Synthroid 25 mcg  daily, half of 0.05 mg tablet as before.  6. Keflex 250 mg q.i.d. for five days.  7. She was to take Tylenol one to two tablets every four to six hours as     needed.   DISCHARGE INSTRUCTIONS:  Activity and wound care were per pacemaker  discharge sheet. No driving for 10 to 14 days. Low fat, low salt, low  cholesterol diet. She was to be seen at the pacemaker clinic at [facility]  December 27 at 9:45 a.m. and Dr. Ladona Ridgel March 16 at 11:10 a.m.      Chinita Pester, C.R.N.P. [facility]                 Doylene Canning. Ladona Ridgel, M.D.    DS/MEDQ  D:  05/11/2003  T:  05/12/2003  Job:  [id number]   cc:   W. Ashley Royalty., M.D.  [address]  [place]  [place] [address]  Fax: [phone]

## 2010-10-06 NOTE — H&P (Signed)
NAME:  Tiffany [name], Tiffany [name]                            ACCOUNT NO.:  [id number]   MEDICAL RECORD NO.:  [id number]                   PATIENT TYPE:  INP   LOCATION:  1824                                 FACILITY:  [facility]   PHYSICIAN:  Darden Palmer., M.D.         DATE OF BIRTH:  1923/03/16   DATE OF ADMISSION:  05/19/2003  DATE OF DISCHARGE:                                HISTORY & PHYSICAL   REASON FOR ADMISSION:  Dizzy spells.   HISTORY:  This 77 year old female has a prior history of a cardiomyopathy.  She had a permanent pacemaker implanted several years ago for syncope and  torsades de pointes and recently had an upgraded biventricular pacemaker and  defibrillator put in by Dr. Ladona Ridgel on April 29, 2003.  At the time,  placement of the left ventricular lead was difficult and she had the lead  placed high on the right ventricular septum.  She had previous bundle branch  block, as well as ischemic cardiomyopathy with an ejection fraction of 25%.  Previously catheterization showed only moderate disease noted in the LAD  with only irregularities elsewhere.  She was readmitted to the hospital on  May 09, 2003, with two episodes of presyncope described as dizziness  and lightheadedness lasting 30 seconds to one minute with some mild  associated dyspnea.  During that admission, she had very minimally elevated  troponins, but the rest of her enzymes were unremarkable.  It was  recommended that she have an outpatient Cardiolite and she was sent home.  She stated that since she was going home that she was severely tired and  slept for several days.  She refused to come back in to have a Cardiolite  scan.  She began to have recurrent episodes of dizziness that she described  as a sinking that came over her and was described as a fullness in her head  and a feeling as if she would pass out.  Often times she would fall and  catch herself and sometimes would have no recollection of the  event.  The  family has witnessed several of these and does not feel that her thinking is  right.  She had multiple episodes today, about eight, but they have always  occurred when she is lying down or sitting.  She continued to complain of  these and was unable to come to the office for examination and came to the  emergency room.  While in the emergency room, she had another spell that was  witness, at which point in time her defibrillator was working properly.  Her  blood pressure was okay and she was lying on the bed with her eyes closed,  but not complaining of vertigo.  She is admitted at this time to evaluate  these episodes.   PAST MEDICAL HISTORY:  Remarkable for:  1. A prior history of obesity.  2. Hyperlipidemia.  3. Hypothyroidism.  4. Hypertension.  5. Prior history of complete heartblock.   PAST SURGICAL HISTORY:  Implantation of defibrillator.   ALLERGIES:  None.   CURRENT MEDICATIONS:  1. Coreg 12.5 mg q.12h.  2. Lisinopril 10 mg daily.  3. Inspra 25 mg daily.  4. Lipitor 10 mg daily.  5. Xalatan eyedrops.  6. Synthroid.   SOCIAL AND FAMILY HISTORY:  Recorded in records dated May 09, 2003, and  are unchanged.   REVIEW OF SYSTEMS:  She has been quite lethargic since this was done.  She  notes that she has not had severe dyspnea and has not had significant edema.  When she was recently in the hospital, she had a CT scan looking for  pulmonary emboli, which was unremarkable.  She has occasional numbness  involving her fingers.  She and the family feel as if she does not get  enough blood flow to her head.  The only other change is that she may have  taken all of her medicines at one time instead of dividing them up as  previous.  Other than as noted above, the remainder of the review of systems  is unremarkable.   PHYSICAL EXAMINATION:  GENERAL APPEARANCE:  She is an elderly woman who is  obese.  She is laying on the bed with her eyes closed during the   examination.  She did not appear to be in any acute distress.  VITAL SIGNS:  Her blood pressure is currently 110/70.  Her pulse was 80.  SKIN:  Warm and dry.  The defibrillator site has healed in well.  HEENT:  EOMI.  PERRLA.  CNS clear.  Fundi unremarkable.  Pharynx negative.  NECK:  There are no carotid bruits noted.  Supple without masses.  No JVD,  thyromegaly, or bruits.  LUNGS:  Clear to A&P.  CARDIAC:  Normal S1 and S2.  No S3.  ABDOMEN:  Soft and nontender.  No mass.  Femoral pulses 2+.  Peripheral  pulses 2+.  No edema noted.   LABORATORY DATA:  The 12-lead EKG shows sinus rhythm with a paced  ventricular rhythm.  Laboratories pending at the time of dictation.   IMPRESSION:  1. Recurrent presyncope and episodic dizziness, rule out malfunctioning     device, rule out neurologic event.  2. Nonischemic cardiomyopathy.  3. Complete heartblock previously.  4. Hypertension.  5. Hyperlipidemia under treatment.  6. History of glaucoma.   RECOMMENDATIONS:  Very, very difficult to assess.  We will admit her for  telemetry monitoring, monitoring of blood pressure, and further observation.  Consultation with electrophysiology services.  Check echocardiogram.  Check  carotid Doppler.  Possibly get neurologic consultation.                                                Darden Palmer., M.D.    WST/MEDQ  D:  05/19/2003  T:  05/19/2003  Job:  [id number]   cc:   Doylene Canning. Ladona Ridgel, M.D.   Talmadge Coventry, M.D.  [address]  [place]  [place] [address]  Fax: [phone]

## 2010-10-06 NOTE — H&P (Signed)
NAME:  Tiffany [name], Tiffany [name]                            ACCOUNT NO.:  [id number]   MEDICAL RECORD NO.:  [id number]                   PATIENT TYPE:  INP   LOCATION:  1823                                 FACILITY:  [facility]   PHYSICIAN:  Vania Rea, M.D.              DATE OF BIRTH:  07/25/1922   DATE OF ADMISSION:  09/21/2003  DATE OF DISCHARGE:                                HISTORY & PHYSICAL   PRIMARY CARE PHYSICIAN:  Talmadge Coventry, M.D.   CARDIOLOGIST:  Georga Hacking, M.D.   ELECTROPHYSIOLOGIST:  Dr. Ladona Ridgel.   CHIEF COMPLAINT:  Episodic dizziness and palpitations.   HISTORY OF PRESENT ILLNESS:  This is a 77 year old Caucasian lady with a  history of congestive heart failure, hypothyroidism, hyperlipidemia, a  complete heart block managed with biventricular ICD which, according to her  son, has given many problems since first placed.  The patient recently had a  revision of biventricular leads because of lead failure causing episodes of  syncope associated with heart block when the leads failed.  The patient was  discharged from the hospital on September 08, 2003, the day after this revision.  According to her son and the patient, the patient was only well for about 24  hours then she started having episodes of feeling sick and episodes of  flutter.  The exact description is a feeling that starts in her stomach and  spreads upwards in her chest, a feeling as if she is about to faint, a  feeling of something bad about to happen.  This, she describes, as exactly  the same feeling that used to happen before when she was about to pass out.  However, on these occasions, she does not pass out, she just feels bad.  The  patient has been so sick that she has been unable to get up and get around  much.  If she does get up, she tends to feel very woozy.  The patient also  has episodes of feeling her heart fluttering.  She has had no episodes of  ICD shock.  So, she has had an episode of  upper respiratory symptoms, and  she called her primary care physician, Dr. Smith Mince, who prescribed for her  Avelox and Promethazine with codeine which she said caused a significant  improvement within 24 hours.  She does note that when the symptoms come on,  she usually hyperventilates and gets very anxious, and she is not sure if  that is all a part of her symptoms.  The patient has an appointment at the  [facility] today at 9:15 a.m.; however, she had a very bad  episode last night, yesterday when she felt as if she was going to pass out  completely, got very panicked and called her relative who have brought her  to the emergency room and insist on her being admitted.  The patient has had  no fever.  She does have a chronic cough from postnasal drip.  She has had  no episodes of frank syncope.  She has not noted any relationship of her  dizziness to the movement of her head but did become extremely dizzy in the  emergency room when asked to shake her head from side-to-side.   PAST MEDICAL HISTORY:  1. Nonischemic cardiomyopathy with ejection fraction of 25%.  2. Complete heart block, status post biventricular pacemaker.  3. Hypothyroidism x 5 years.  4. Hyperlipidemia.  5. Hypertension.  6. Glaucoma.  7. Osteoporosis.  8. Recent episode of self described bronchitis.   MEDICATIONS:  1. Coreg 3.125 mg twice daily.  2. Lipitor 10 mg daily.  3. Synthroid 50 mcg daily.  4. Recently completed Avelox 400 mg daily for seven days.  5. Recently completed Promethazine with codeine 1-2 teaspoons every four     hours when necessary.  6. Xalatan eye drops both eyes at bedtime.   ALLERGIES:  No known drug allergies.   SOCIAL HISTORY:  She lives by herself in [place].  Her family tends to  chip in to help her with her cooking and cleaning.  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  Has not smoked since her 78s.  She does  not drink alcohol.  She does seem  to drink a lot of caffeine.  She has  several sons who are very involved in her healthcare, and she generally  seems to have a good support system.   FAMILY HISTORY:  Includes hypertension and COPD in her parents but there is  no significant coronary artery disease.  She has no siblings.   REVIEW OF SYSTEMS:  Denies headache, denies frank syncope, denies focal  weakness.  Does have glaucoma for which she takes Xalatan and has immature  cataracts.  Does have a postnasal drip which she relates to the season and  has recently been treated with antibiotics for what seems like an upper  respiratory infection.  She does have a chronic cough. Denies chest pain.  Denies orthopnea but does not like to lie flat.  She feels uncomfortable.  In fact, became orthopneic in the emergency room.  Denies paroxysmal  nocturnal dyspnea.  Recently had a sore throat associated with the  bronchitis.  No dental problems.  Does have a thyroid problem.  No  diabetes.  No nausea, vomiting, diarrhea, or constipation, or bloody stool.  Does have stress incontinence and frequency but no hematuria.  Does have  osteoporosis and kyphosis x 5 years.  No joint pains.   PHYSICAL EXAMINATION:  GENERAL:  This is an extremely pleasant, elderly,  Caucasian lady lying in bed.  Very appreciative of the care that her sons  are taking of her.  She is feeling no pain.  VITAL SIGNS:  Her temperature is 97.2.  Her blood pressure is 156/75.  Her  pulse varies between 85-95.  Her respiratory rate is 16.  She is saturating  at 98% on room air.  HEENT:  Her pupils equal, round and reactive to light.  Mucous membranes are  moist and pink.  She is anicteric.  She has no jugular venous distention.  She is exquisitely tender in the upper portion of the right sternomastoid  and apparently in the right submandibular area.  She has not noted that before.  She has submental fat pad but no obvious thyromegaly.  She has very  good dentition.   CHEST:  Clear to auscultation bilaterally.  She has a marked kyphosis.  CARDIOVASCULAR:  She has a regular rhythm.  ABDOMEN:  Soft and nontender.  EXTREMITIES:  Dorsalis pedis pulses are 2+ bilaterally.  Good capillary  refill.  There is no edema.  She has no joint swelling or tenderness.  CNS:  She is alert and oriented  x 3.  Her cranial nerves are grossly  intact.  Power is grade 5 throughout and reflexes are normal throughout.   LABS:  Her white count is 6.9.  Her hematocrit is 36.2.  Her MCV is 98.  Her  platelet count is 268, and she has a normal differential on her white count.  The serum chemistry:  Her sodium is 141, the potassium is a little low at  3.4.  Her BUN is 10.  Her creatinine is 0.9.  Her glucose is 106.  Her pH is  7.47 on the I-STAT with a pCO2 of 36.8.  Urinalysis revealed a specific  gravity of 1.005 and it is otherwise unremarkable.  One set of cardiac  enzymes are completely negative.  The CK-MB is less than 1, and the troponin  is less than 0.05.   Her chest x-ray shows no acute abnormalities, and her EKG shows a rate of 85  with many non-paced beats.   ASSESSMENT:  1. Episodic dizziness of unclear etiology.  2. Rule out sinusitis.  3. Rule out carotid sinus disease.  4. Rule out yet another pacemaker problem.  5. Hypothyroidism.  6. Hyperlipidemia.  7. Hypertension, uncontrolled.  8. Glaucoma.   PLAN:  We will admit this lady to the telemetry service and get a cardiology  consult in the morning to evaluate her pacemaker and cardiac function, since  the cardiologists know her very well.  We will get a CT scan of the head and  the neck to evaluate her paranasal sinuses and the soft tissues to find out  why she is so exquisitely tender in the submandibular area.  We will not  start her on any antibiotics at this time.  We will go ahead and culture her  urine.  We will also get orthostatic blood pressures.                                                 Vania Rea, M.D.    LC/MEDQ  D:  09/22/2003  T:  09/22/2003  Job:  [id number]

## 2010-10-06 NOTE — Consult Note (Signed)
NAME:  Tiffany [name], Tiffany [name]                            ACCOUNT NO.:  [id number]   MEDICAL RECORD NO.:  [id number]                   PATIENT TYPE:  INP   LOCATION:  5529                                 FACILITY:  [facility]   PHYSICIAN:  Doylene Canning. Ladona Ridgel, M.D.               DATE OF BIRTH:  1923-03-04   DATE OF CONSULTATION:  09/22/2003  DATE OF DISCHARGE:                                   CONSULTATION   REFERRING PHYSICIAN:  Vania Rea, M.D.   REASON FOR CONSULTATION:  Evaluation of recurrent dizzy spells with near  syncope.   HISTORY OF PRESENT ILLNESS:  The patient is a 77 year old woman with a  history of complete heartblock and a dilated cardiomyopathy.  She underwent  permanent pacemaker insertion several years ago.  She was referred to me  initially back in November because of congestive heart failure in  conjunction with dual-chamber pacing.  Her ejection fraction was  approximately 15%.  She underwent upgrade to a biventricular dual-chamber  ICD back in December 2004 with the procedure complicated by difficult LV  lead access.  This was eventually accomplished successfully and the patient,  however, on the day after her upgraded device had evidence of LV lead  dysfunction and ultimately her lead was turned off.  She continued to have  episodes of dizzy spells.  She had a frank syncopal episode back in April of  2005 and came to the emergency room.  This occurred while she was she had  her hands up over her arms and in the emergency room, she had evidence of  ventricular lack of pacing output, strongly suggestive of __________  ventricular sensing of atrial activity.  There are long pauses correlating  with her symptoms.  Review of her chest x-rays at that time demonstrated  progressive migration of the patient's defibrillation lead from a position  in RA apex back to the RV inflow tract.  Her lead was subsequently  repositioned back to the RV apex and actively fixed.  In addition,  her LV  pacing lead, which had been dysfunctional, was repositioned so that it  functioned appropriately.  She tolerated the procedure well and was  discharged home.  She continued to have recurrent dizzy spells, however, and  did not seek medical attention until the day of admission (Sep 21, 2003).  The patient states that she had developed some pneumonia/bronchitis and was  improving somewhat on Avelox 400 mg a day.  The patient states that on the  day of admission, she felt a sensation like she was going to pass out with  dizziness.  This occurred while she was sitting watching television.  Because her symptoms were similar to prior episodes where she had lost  consciousness, she came to the emergency room.  She has been admitted for  additional evaluation.   The patient denies frank chest pain and she denies shortness of  breath.  She  does feel weak.  CT scan in the emergency room demonstrated no acute CNS  process.  Her EKG demonstrates normal sinus rhythm with pacing __________  ventricular pacing.  The admission labs were unrevealing.  She was referred  for additional evaluation.  The patient denies any palpitations or any  intercurrent ICD therapies.   MEDICATIONS ON ADMISSION:  Avelox, Coreg, Lipitor, Synthroid, and eye drops.   ALLERGIES:  She gives no drug allergies.   PAST MEDICAL HISTORY:  Notable for:  1. Complete heartblock as previously noted.  2. Nonischemic cardiomyopathy.   SOCIAL HISTORY:  The patient lives in [place] and is widowed.  She denies  tobacco or ethanol use.   FAMILY HISTORY:  Notable for mother died at age 13 of an MI and father died  at age 58 of COPD.  She has no siblings.   REVIEW OF SYSTEMS:  Notable for a spell of recent bronchitis.  She denies  fevers, chills or night sweats.  She denies headache, vision, or hearing  problems.  She denies skin rashes or lesions.  She denies shortness of  breath, PND, orthopnea or chest pain.  She denies  wheezing but she has a  chronic cough.  She denies dysuria, hematuria, nocturia.  She denies nausea,  vomiting, diarrhea, or constipation.  The rest of her review of systems was  negative.   PHYSICAL EXAMINATION:  GENERAL:  She is a pleasant, elderly-appearing woman  in no distress.  VITAL SIGNS:  Blood pressure 101/51, pulse 85 and regular, respirations 18,  temperature 97.  HEENT:  Normocephalic, atraumatic.  Pupils are equal and round.  Oropharynx  moist.  Sclerae anicteric.  NECK:  No jugular venous distension.  There is no thyromegaly.  Trachea  midline.  LUNGS:  Clear bilaterally to auscultation.  No wheezes, rales or rhonchi  appreciated.  CARDIOVASCULAR:  Regular rate and rhythm with normal S1 and S2.  I did not  appreciate a gallop.  CHEST:  Demonstrated very small amount of ecchymoses over her defibrillator  incision site, but no obvious hematoma.  ABDOMEN:  Soft, nontender, nondistended.  There is no organomegaly present.  EXTREMITIES:  Demonstrated no cyanosis, clubbing or edema.  Pulses were 2+  and symmetric.   LABORATORY DATA:  Of note, orthostatic blood pressures were taken.  Her  heart rate was 87 in the supine position with a blood pressure of 130/60.  In the upright position, heart rate of 103 with a blood pressure of 128/53.  Cardiac enzymes were noted to be negative and other labs were unremarkable.   IMPRESSION:  1. Sudden onset of weakness, near syncope.  2. Status post recent implantable cardioverter defibrillator revision with     presently stable implantable cardioverter defibrillator lead function and     no evidence of ventricular arrhythmias with interrogation of the device.  3. Nonischemic cardiomyopathy, ejection fraction 15-20%.  4. Recent bronchitis.   DISCUSSION:  The etiology of the patient's symptoms are still confusing.  There is presently no evidence of defibrillator dysfunction and her leads appear to be working satisfactorily.  Her LV lead  threshold has gone up  slightly and she has been reprogrammed with this.  Overall she has  demonstrated no arrhythmias on telemetry.  My recommendation would be to  continue a period of watchful observation and to try to progressively  increase her activities.  Would consider PT and OT consult.  Doylene Canning. Ladona Ridgel, M.D.    GWT/MEDQ  D:  09/22/2003  T:  09/23/2003  Job:  [id number]   cc:   Talmadge Coventry, M.D.  [address]  [place]  [place] [address]  Fax: [phone]   W. Ashley Royalty., M.D.  [address]  [place]  [place] [address]  Fax: [phone]

## 2010-10-06 NOTE — Op Note (Signed)
NAME:  Tiffany [name], Tiffany [name]                            ACCOUNT NO.:  [id number]   MEDICAL RECORD NO.:  [id number]                   PATIENT TYPE:  INP   LOCATION:  3710                                 FACILITY:  [facility]   PHYSICIAN:  Doylene Canning. Ladona Ridgel, M.D.               DATE OF BIRTH:  [birth year]   DATE OF PROCEDURE:  09/07/2003  DATE OF DISCHARGE:  09/08/2003                                 OPERATIVE REPORT   PROCEDURE:  ICD and LV lead revision in a patient with underlying complete  heart block, syncope and non-ischemic cardiomyopathy with an ejection  fraction of 25%.   I. INTRODUCTION:  The patient is a 77 year old woman with a history of  complete heart block status post pacemaker insertion several years ago.  She  was nearly at device ERI when she underwent upgrade to a biventricular ICD  secondary to class III heart failure, a wide QRS rhythm and a dilated  cardiomyopathy which was long-standing.  The patient had recurrent dizzy  spells and had a frank episode of syncope on the day of admission.  In the  emergency department, she was found to have evidence of intermittent pauses  secondary to ventricular oversensing. A chest x-ray demonstrated that the  defibrillation lead had been withdrawn from the RV apical septum to the RV  in flow tract and there was intermittent non-capture.  She is now referred  for ICD lead revision.   II. PROCEDURE:  After informed consent was obtained the patient was taken to  the diagnostic EP laboratory in the fasting state.  After the usual  preparation and draping, intravenous Fentanyl and Midazolam were given for  sedation.  A total of 30 mL of lidocaine was infiltrated into the right  infraclavicular region.  A 10 cm incision was carried out over this region  and electrocautery used to dissect down to the fascial plane.  Care was  taken to avoid ICD inhibition.  The old RV pacemaker lead was then removed  from its cap and utilized for temporary pacing.  The LV and ICD and right  atrial leads were then removed from the defibrillator.  Initially, the right  atrial lead was evaluated and P waves measured 2.4 millivolts and the atrial  threshold was 1.0 volt at 0.5 milliseconds.  Pacing impedance was 399 ohms  and stable.  At this point, a stylette was placed in the right  ventricular/ICD lead and the active fixation screw was retracted into the  body of the body.  Mapping was then carried out.  Again, on the RV septum at  the final site, R waves measured 11 millivolts and a pacing threshold of 0.8  volts at 0.5 milliseconds once the lead was actively fixed.  The pacing  impedance was 540 ohms.  The attention was then turned to the left  ventricular lead.  It was in the coronary sinus having  been withdrawn from  the posterolateral vein.  A floppy guide wire was advanced through the LV  lead and run out into a lateral vein.  At this point, the LV lead was  advanced over the previously placed guide wire out to the lateral vein  approximately two thirds from base to apex. At this location, the patient's  threshold varied between 1.8 and 2.0 volts at 0.5 milliseconds.  Ten volt  pacing at this location did not stimulate the diaphragm. The LV waves  measured approximately 11 millivolts. Pacing impedance was 657 ohms.  With  both LV and defibrillation leads in satisfactory position, they were secured  to the subpectoralis fascia with a figure-of-eight silk suture.  In  addition, the sewing sleeves were secured with silk suture.  At this point,  the pocket was irrigated with Kanamycin and the previously implanted  Medtronic Bi-V defibrillator was connected back to the atrial, LV and  defibrillation leads and placed back in the subcutaneous pocket.  The pocket  was irrigated with Kanamycin and defibrillation threshold testing carried  out.   After the patient was more deeply sedated with Fentanyl and Versed,  ventricular fibrillation was induced  with a T wave shock.  A 14 joule shock  was then delivered terminating ventricular fibrillation and restoring sinus  rhythm.  Five minutes was allowed to elapse and a second VF test carried  out. Again, VF was induced with a T wave shock and again a 14 joule shock  was delivered terminating ventricular fibrillation and restoring sinus  rhythm.  No additional defibrillation threshold testing was carried out and  the incision was closed with a layer of 2-0 Vicryl followed by a layer of 3-  0 Vicryl followed by a layer of 4-0 Vicryl.  Benzoin was painted on the skin  and Steri-Strips were applied.  The patient was returned to her room in  satisfactory condition.   III. COMPLICATIONS:  There were no immediate procedure complications.   IV. RESULTS:  This demonstrated successful right ventricular/ICD and left  ventricular lead repositioning in a patient with a dilated cardiomyopathy  and congestive heart failure with left bundle branch block QRS morphology.                                               Doylene Canning. Ladona Ridgel, M.D.    GWT/MEDQ  D:  09/07/2003  T:  09/08/2003  Job:  [id number]   cc:   W. Ashley Royalty., M.D.  [address]  [place]  [place] [address]  Fax: [phone]   Talmadge Coventry, M.D.  [address]  [place]  [place] [address]  Fax: [phone]

## 2010-12-22 ENCOUNTER — Ambulatory Visit: Payer: Medicare Other | Admitting: Internal Medicine

## 2011-01-02 ENCOUNTER — Encounter: Payer: Self-pay | Admitting: Internal Medicine

## 2011-01-02 ENCOUNTER — Ambulatory Visit (INDEPENDENT_AMBULATORY_CARE_PROVIDER_SITE_OTHER): Payer: Medicare Other | Admitting: Internal Medicine

## 2011-01-02 DIAGNOSIS — Z950 Presence of cardiac pacemaker: Secondary | ICD-10-CM

## 2011-01-02 DIAGNOSIS — I5022 Chronic systolic (congestive) heart failure: Secondary | ICD-10-CM

## 2011-01-02 DIAGNOSIS — I472 Ventricular tachycardia: Secondary | ICD-10-CM

## 2011-01-02 DIAGNOSIS — I428 Other cardiomyopathies: Secondary | ICD-10-CM

## 2011-01-02 DIAGNOSIS — I10 Essential (primary) hypertension: Secondary | ICD-10-CM

## 2011-01-02 DIAGNOSIS — I5020 Unspecified systolic (congestive) heart failure: Secondary | ICD-10-CM

## 2011-01-02 LAB — PACEMAKER DEVICE OBSERVATION
AL IMPEDENCE PM: 399 Ohm
AL THRESHOLD: 1.5 V
BATTERY VOLTAGE: 2.9714 V

## 2011-01-02 NOTE — Patient Instructions (Signed)
Your physician wants you to follow-up in: April of 2013 with Dr Court Joy will receive a reminder letter in the mail two months in advance. If you don't receive a letter, please call our office to schedule the follow-up appointment.

## 2011-01-02 NOTE — Progress Notes (Signed)
HPI Tiffany [name] returns today for followup. She is an 77 year old woman with a history of ventricular arrhythmias, chronic systolic heart failure, status post biventricular defibrillator with recent generator change and insertion of a biventricular pacemaker. The patient continued to have class II heart failure symptoms. She is mostly sedentary. She had no new complaints today. She has had no syncope. She has minimal peripheral edema. No Known Allergies   Current Outpatient Prescriptions  Medication Sig Dispense Refill  . aspirin 325 MG EC tablet Take 325 mg by mouth daily. 2 tabs bid      . carvedilol (COREG) 25 MG tablet Take 25 mg by mouth 2 (two) times daily with a meal.       . diazepam (VALIUM) 10 MG tablet Take 1 tablet (10 mg total) by mouth as directed.  30 tablet  3  . fish oil-omega-3 fatty acids 1000 MG capsule Take 2 g by mouth daily.        Marland Kitchen levothyroxine (SYNTHROID, LEVOTHROID) 50 MCG tablet Take 50 mcg by mouth as needed.       . Multiple Vitamin (MULTIVITAMIN) capsule Take 1 capsule by mouth daily.        Marland Kitchen telmisartan (MICARDIS) 80 MG tablet Take 1 tablet (80 mg total) by mouth daily.  30 tablet  5  . timolol (BETIMOL) 0.5 % ophthalmic solution Place 1 drop into both eyes 2 (two) times daily.        . traMADol (ULTRAM) 50 MG tablet Take 1 tablet (50 mg total) by mouth 4 (four) times daily as needed.  30 tablet  5  . travoprost, benzalkonium, (TRAVATAN) 0.004 % ophthalmic solution Place 1 drop into both eyes at bedtime.        . Vitamin D, Ergocalciferol, (DRISDOL) 50000 UNITS CAPS Take 1 capsule (50,000 Units total) by mouth every 7 (seven) days.  4 capsule  5     Past Medical History  Diagnosis Date  . Status post biventricular pacemaker     with AICD; 2004; revision 2007  . Essential hypertension, benign   . Hyperlipidemia   . Hypothyroidism   . Complete heart block   . Nonischemic cardiomyopathy     EF 45-50% in 2009  . Systolic heart failure   . Glaucoma   .  Macular degeneration   . Ventricular tachycardia     ROS:   All systems reviewed and negative except as noted in the HPI.   Past Surgical History  Procedure Date  . Cardiac defibrillator placement   . Cholecystectomy   . Appendectomy   . Tonsillectomy   . Cataract surgery      Family History  Problem Relation Age of Onset  . Hypertension Mother   . Coronary artery disease Mother      History   Social History  . Marital Status: Widowed    Spouse Name: N/A    Number of Children: N/A  . Years of Education: N/A   Occupational History  . Not on file.   Social History Main Topics  . Smoking status: Never Smoker   . Smokeless tobacco: Never Used  . Alcohol Use: No  . Drug Use: No  . Sexually Active: Not on file   Other Topics Concern  . Not on file   Social History Narrative  . No narrative on file     BP 93/60  Pulse 77  Resp 12  Ht 5\' 6"  (1.676 m)  Wt 176 lb (79.833 kg)  BMI 28.41  kg/m2  Physical Exam:  Well appearing elderly woman, NAD HEENT: Unremarkable Neck:  No JVD, no thyromegally Lymphatics:  No adenopathy Back:  No CVA tenderness Lungs:  Clear. With minimal basilar rales. No wheezes or rhonchi. Well-healed pacemaker incision. HEART:  Regular rate rhythm, no murmurs, no rubs, no clicks Abd:  soft, positive bowel sounds, no organomegally, no rebound, no guarding Ext:  2 plus pulses, no edema, no cyanosis, no clubbing Skin:  No rashes no nodules Neuro:  CN II through XII intact, motor grossly intact  DEVICE  Normal device function.  See PaceArt for details.   Assess/Plan:

## 2011-01-02 NOTE — Assessment & Plan Note (Signed)
Her symptoms remain class II. She will continue her current medical therapy and maintain a low-sodium diet. 

## 2011-01-02 NOTE — Assessment & Plan Note (Signed)
Her blood pressure is well controlled. She may require a reduction in her medications however she appears to be tolerating her beta blocker.

## 2011-01-02 NOTE — Assessment & Plan Note (Signed)
She has had no recurrent ventricular arrhythmias. Will do appear to watchful waiting. She will continue her beta blocker.

## 2011-01-26 ENCOUNTER — Encounter: Payer: Self-pay | Admitting: Internal Medicine

## 2011-01-26 DIAGNOSIS — Z0000 Encounter for general adult medical examination without abnormal findings: Secondary | ICD-10-CM | POA: Insufficient documentation

## 2011-02-02 ENCOUNTER — Other Ambulatory Visit (INDEPENDENT_AMBULATORY_CARE_PROVIDER_SITE_OTHER): Payer: Medicare Other

## 2011-02-02 ENCOUNTER — Other Ambulatory Visit: Payer: Self-pay | Admitting: Internal Medicine

## 2011-02-02 ENCOUNTER — Encounter: Payer: Self-pay | Admitting: Internal Medicine

## 2011-02-02 ENCOUNTER — Ambulatory Visit (INDEPENDENT_AMBULATORY_CARE_PROVIDER_SITE_OTHER): Payer: Medicare Other | Admitting: Internal Medicine

## 2011-02-02 VITALS — BP 102/70 | HR 74 | Temp 97.9°F | Ht 66.0 in | Wt 182.0 lb

## 2011-02-02 DIAGNOSIS — Z0000 Encounter for general adult medical examination without abnormal findings: Secondary | ICD-10-CM

## 2011-02-02 DIAGNOSIS — R7302 Impaired glucose tolerance (oral): Secondary | ICD-10-CM

## 2011-02-02 DIAGNOSIS — G4700 Insomnia, unspecified: Secondary | ICD-10-CM

## 2011-02-02 DIAGNOSIS — M1990 Unspecified osteoarthritis, unspecified site: Secondary | ICD-10-CM | POA: Insufficient documentation

## 2011-02-02 DIAGNOSIS — I442 Atrioventricular block, complete: Secondary | ICD-10-CM | POA: Insufficient documentation

## 2011-02-02 DIAGNOSIS — M810 Age-related osteoporosis without current pathological fracture: Secondary | ICD-10-CM | POA: Insufficient documentation

## 2011-02-02 DIAGNOSIS — H409 Unspecified glaucoma: Secondary | ICD-10-CM | POA: Insufficient documentation

## 2011-02-02 DIAGNOSIS — T7840XA Allergy, unspecified, initial encounter: Secondary | ICD-10-CM | POA: Insufficient documentation

## 2011-02-02 DIAGNOSIS — M129 Arthropathy, unspecified: Secondary | ICD-10-CM

## 2011-02-02 DIAGNOSIS — H9190 Unspecified hearing loss, unspecified ear: Secondary | ICD-10-CM | POA: Insufficient documentation

## 2011-02-02 DIAGNOSIS — E559 Vitamin D deficiency, unspecified: Secondary | ICD-10-CM | POA: Insufficient documentation

## 2011-02-02 HISTORY — DX: Impaired glucose tolerance (oral): R73.02

## 2011-02-02 LAB — CBC WITH DIFFERENTIAL/PLATELET
Basophils Absolute: 0 10*3/uL (ref 0.0–0.1)
Eosinophils Relative: 4.5 % (ref 0.0–5.0)
HCT: 36.2 % (ref 36.0–46.0)
Hemoglobin: 12.3 g/dL (ref 12.0–15.0)
Lymphs Abs: 3.1 10*3/uL (ref 0.7–4.0)
MCV: 103.6 fl — ABNORMAL HIGH (ref 78.0–100.0)
Monocytes Absolute: 0.5 10*3/uL (ref 0.1–1.0)
Neutro Abs: 3.4 10*3/uL (ref 1.4–7.7)
Platelets: 155 10*3/uL (ref 150.0–400.0)
RDW: 13.6 % (ref 11.5–14.6)

## 2011-02-02 LAB — HEPATIC FUNCTION PANEL
AST: 23 U/L (ref 0–37)
Alkaline Phosphatase: 71 U/L (ref 39–117)
Total Bilirubin: 0.4 mg/dL (ref 0.3–1.2)

## 2011-02-02 LAB — LIPID PANEL
HDL: 56.6 mg/dL (ref >39.00)
Total CHOL/HDL Ratio: 4
VLDL: 80.4 mg/dL — ABNORMAL HIGH (ref 0.0–40.0)

## 2011-02-02 LAB — TSH: TSH: 4.04 u[IU]/mL (ref 0.35–5.50)

## 2011-02-02 MED ORDER — TRAMADOL HCL ER 100 MG PO TB24: 100.0000 mg | ORAL_TABLET | Freq: Every day | ORAL | Status: AC

## 2011-02-02 MED ORDER — TEMAZEPAM 15 MG PO CAPS: ORAL_CAPSULE | ORAL | Status: DC

## 2011-02-02 NOTE — Progress Notes (Signed)
Subjective:    Patient ID: Tiffany [name], female    DOB: 03-07-1923, 77 y.o.   MRN: [id number]  HPI  Here to establish as new pt; mentions most significant problem is gradually worsening left shoulder pain over several yrs, less so the right shoulder, but has to push on the chair to stand and pain is moderate, occasionally severe.  No falls or other trauma. Takes Asa and tramadol 50 mg prn - needs a bit better control.  Pt denies chest pain, increased sob or doe, wheezing, orthopnea, PND, increased LE swelling, palpitations, dizziness or syncope.  Pt denies new neurological symptoms such as new headache, or facial or extremity weakness or numbness.  Pt denies polydipsia, polyuria.  Also with chronic recurrent insomnia, but does not want ambien b/c of being afraid of "doing something stupid at night." Past Medical History  Diagnosis Date  . Status post biventricular pacemaker     with AICD; 2004; revision 2007  . Complete heart block   . Nonischemic cardiomyopathy     EF 45-50% in 2009  . Systolic heart failure   . Macular degeneration   . Ventricular tachycardia   . Arthritis   . Glaucoma   . Hyperlipidemia   . Essential hypertension, benign   . Hypothyroidism   . Osteoporosis   . Allergy    Past Surgical History  Procedure Date  . Cardiac defibrillator placement   . Cholecystectomy   . Appendectomy   . Tonsillectomy   . Cataract surgery     reports that she has never smoked. She has never used smokeless tobacco. She reports that she does not drink alcohol or use illicit drugs. family history includes Arthritis in her other; Cancer in her other; Coronary artery disease in her mother; Heart disease in her other; and Hypertension in her mother and other. No Known Allergies  Review of Systems Review of Systems  Constitutional: Negative for diaphoresis, activity change, appetite change and unexpected weight change.  HENT: Negative for hearing loss, ear pain, facial swelling, mouth  sores and neck stiffness.   Eyes: Negative for pain, redness and visual disturbance.  Respiratory: Negative for shortness of breath and wheezing.   Cardiovascular: Negative for chest pain and palpitations.  Gastrointestinal: Negative for diarrhea, blood in stool, abdominal distention and rectal pain.  Genitourinary: Negative for hematuria, flank pain and decreased urine volume.  Musculoskeletal: Negative for myalgias and joint swelling.  Skin: Negative for color change and wound.  Neurological: Negative for syncope and numbness.  Hematological: Negative for adenopathy.  Psychiatric/Behavioral: Negative for hallucinations, self-injury, decreased concentration and agitation.      Objective:   Physical Exam BP 102/70  Pulse 74  Temp(Src) 97.9 F (36.6 C) (Oral)  Ht 5\' 6"  (1.676 m)  Wt 182 lb (82.555 kg)  BMI 29.38 kg/m2  SpO2 95% Physical Exam  VS noted, frail but able to asend exam table, no cane or other support todayy Constitutional: Pt is oriented to person, place, and time. Appears well-developed and well-nourished.  HENT:  Head: Normocephalic and atraumatic.  Right Ear: External ear normal.  Left Ear: External ear normal.  Nose: Nose normal.  Mouth/Throat: Oropharynx is clear and moist.  Eyes: Conjunctivae and EOM are normal. Pupils are equal, round, and reactive to light.  Neck: Normal range of motion. Neck supple. No JVD present. No tracheal deviation present.  Cardiovascular: Normal rate, regular rhythm, normal heart sounds and intact distal pulses.   Pulmonary/Chest: Effort normal and breath sounds normal.  Abdominal: Soft. Bowel sounds are normal. There is no tenderness.  Musculoskeletal: Normal range of motion. Exhibits no edema.  Lymphadenopathy:  Has no cervical adenopathy.  Neurological: Pt is alert and oriented to person, place, and time. Pt has normal reflexes. No cranial nerve deficit.  Skin: Skin is warm and dry. No rash noted.  Psychiatric:  Has  normal mood  and affect. Behavior is normal. Good cognitive ability for age        Assessment & Plan:

## 2011-02-02 NOTE — Patient Instructions (Signed)
Stop the tramadol 50 mg Start the Tramadol ER (extended relaese, long acting) 100 mg per day Call if you feel you need 200 mg tramadol instead of the 100 mg per day Take all new medications as prescribed - the restoril at night Continue all other medications as before Please go to LAB in the Basement for the blood and/or urine tests to be done today Please call the phone number [phone] (the [employer] System) for results of testing in 2-3 days;  When calling, simply dial the number, and when prompted enter the MRN number above (the Medical Record Number) and the # key, then the message should start. Please return in 6 months, or sooner if needed

## 2011-02-03 ENCOUNTER — Encounter: Payer: Self-pay | Admitting: Internal Medicine

## 2011-02-03 NOTE — Assessment & Plan Note (Signed)

## 2011-02-03 NOTE — Assessment & Plan Note (Signed)
Ok to change to ER tramadol for better pain control,  to f/u any worsening symptoms or concerns

## 2011-02-03 NOTE — Assessment & Plan Note (Signed)
Ok for restoril prn ,  to f/u any worsening symptoms or concerns

## 2011-02-05 LAB — BASIC METABOLIC PANEL
Calcium: 9.9 mg/dL (ref 8.4–10.5)
GFR: 47.49 mL/min — ABNORMAL LOW (ref >60.00)
Potassium: 4.5 mEq/L (ref 3.5–5.1)
Sodium: 139 mEq/L (ref 135–145)

## 2011-02-08 ENCOUNTER — Telehealth: Payer: Self-pay

## 2011-02-08 NOTE — Telephone Encounter (Signed)
PA received for pt's Tramadol. Approved over the phone through 05/21/2011. Pharmacy advised via phone and return fax.

## 2011-03-19 ENCOUNTER — Other Ambulatory Visit: Payer: Self-pay | Admitting: *Deleted

## 2011-03-19 MED ORDER — CARVEDILOL 25 MG PO TABS: 25.0000 mg | ORAL_TABLET | Freq: Two times a day (BID) | ORAL | Status: DC

## 2011-03-29 ENCOUNTER — Other Ambulatory Visit: Payer: Self-pay | Admitting: *Deleted

## 2011-03-29 DIAGNOSIS — I10 Essential (primary) hypertension: Secondary | ICD-10-CM

## 2011-03-29 MED ORDER — TELMISARTAN 80 MG PO TABS: 80.0000 mg | ORAL_TABLET | Freq: Every day | ORAL | Status: DC

## 2011-04-16 ENCOUNTER — Other Ambulatory Visit: Payer: Self-pay | Admitting: Cardiology

## 2011-04-18 MED ORDER — ERGOCALCIFEROL 1.25 MG (50000 UT) PO CAPS: 50000.0000 [IU] | ORAL_CAPSULE | ORAL | Status: DC

## 2011-04-18 NOTE — Telephone Encounter (Signed)
Addended by: Royanne Foots on: 04/18/2011 10:23 AM   Modules accepted: Orders

## 2011-05-02 ENCOUNTER — Telehealth: Payer: Self-pay

## 2011-05-02 NOTE — Telephone Encounter (Signed)
Patient called to inquire if we received lab results from [facility] done in February. Medicare has not paid for her lab work done at our office and thinks this may be why? Please advise. Call back number is [phone]

## 2011-05-02 NOTE — Telephone Encounter (Signed)
There is cbc from feb 2012 on the chart   I could not explain the billing problem;  Consider forward to lou

## 2011-05-03 NOTE — Telephone Encounter (Signed)
Called the patient left message to call back 

## 2011-05-08 NOTE — Telephone Encounter (Signed)
Called left message to call back 

## 2011-05-21 ENCOUNTER — Telehealth: Payer: Self-pay | Admitting: Cardiology

## 2011-05-21 ENCOUNTER — Telehealth: Payer: Self-pay | Admitting: *Deleted

## 2011-05-21 ENCOUNTER — Telehealth: Payer: Self-pay

## 2011-05-21 MED ORDER — COLCHICINE 0.6 MG PO TABS: ORAL_TABLET | ORAL | Status: DC

## 2011-05-21 NOTE — Telephone Encounter (Signed)
Patient called, stating she has gout in left great toe.States she has not seen PCP,wants Dr.Jordan to prescribe colchicine.Patient advised she needs to call PCP or go to Urgent Care.

## 2011-05-21 NOTE — Telephone Encounter (Signed)
Done per emr 

## 2011-05-21 NOTE — Telephone Encounter (Signed)
Patient informed prescription requested has been sent to the pharmacy.

## 2011-05-21 NOTE — Telephone Encounter (Signed)
R'cd fax from [facility] for clarification of Colcrys rx. Maximum dosing per pharmacist is 1 tablet qd or bid-they are asking for new directions for rx-please advise.

## 2011-05-21 NOTE — Telephone Encounter (Signed)
New Msg: Pt calling stating that she has gout and wanted to discuss with Dr. [place] if pt can take Colchcine for relief. Please return pt call to discuss further.

## 2011-05-21 NOTE — Telephone Encounter (Signed)
Pt called c/o Gout in her LT foot. Pt is requesting Rx for Colchicine.

## 2011-05-21 NOTE — Telephone Encounter (Signed)
Ok for bid prn

## 2011-05-23 NOTE — Telephone Encounter (Signed)
Pharmacist informed. 

## 2011-07-14 ENCOUNTER — Other Ambulatory Visit: Payer: Self-pay | Admitting: Cardiology

## 2011-07-17 ENCOUNTER — Telehealth: Payer: Self-pay | Admitting: Cardiology

## 2011-07-17 NOTE — Telephone Encounter (Signed)
New Msg: Pt calling needing refill of Tramadol 50 mg called into [facility] on [address]. Pt is out of medication and needs refill called in ASAP.

## 2011-07-17 NOTE — Telephone Encounter (Signed)
Pt is taking Tramadol 100mg , (written by Dr Jonny Ruiz), but feels it is too strong during the day.  She is requesting that the 50mg  rx be refilled for her.  I recommended that she call Dr Raphael Gibney office to discuss this with him since he was the one to increase her to 100mg .  She agreed.

## 2011-07-19 ENCOUNTER — Other Ambulatory Visit: Payer: Self-pay | Admitting: Internal Medicine

## 2011-07-24 NOTE — Telephone Encounter (Signed)
Opened in error

## 2011-08-03 ENCOUNTER — Ambulatory Visit: Payer: Medicare Other | Admitting: Internal Medicine

## 2011-09-04 NOTE — Telephone Encounter (Signed)
ENCOUNTER COMPLETED 

## 2011-10-09 ENCOUNTER — Telehealth: Payer: Self-pay

## 2011-10-09 NOTE — Telephone Encounter (Signed)
PA for Tramadol proceed with PA or offer alternative.

## 2011-10-10 NOTE — Telephone Encounter (Signed)
Called the patient and she is taking the Tramadol 50 mg qid prn and stated they are working very well and does not need to take qid.  The PA was for the Tramadol ER 100 and will disregard at this time.

## 2011-10-10 NOTE — Telephone Encounter (Signed)
Called the patient left message to call back 

## 2011-10-10 NOTE — Telephone Encounter (Signed)
If the PA is for tramadol ER 100 qd, this is not needed  The tramadol 50 qid prn was done in feb 2013, so not sure why we get a PA request for this now  Please clarify, and ok for PA for the 50 qid prn as she is not good candidate for NSAID due to risk of renal failure in elderly, and tylenol otc not working

## 2011-10-16 ENCOUNTER — Other Ambulatory Visit: Payer: Self-pay | Admitting: *Deleted

## 2011-10-16 MED ORDER — CARVEDILOL 25 MG PO TABS: 25.0000 mg | ORAL_TABLET | Freq: Two times a day (BID) | ORAL | Status: DC

## 2011-10-18 ENCOUNTER — Other Ambulatory Visit: Payer: Self-pay | Admitting: Cardiology

## 2011-10-18 DIAGNOSIS — I10 Essential (primary) hypertension: Secondary | ICD-10-CM

## 2011-10-18 MED ORDER — TELMISARTAN 80 MG PO TABS: 80.0000 mg | ORAL_TABLET | Freq: Every day | ORAL | Status: DC

## 2011-10-30 ENCOUNTER — Encounter: Payer: Self-pay | Admitting: Cardiology

## 2011-11-01 ENCOUNTER — Telehealth: Payer: Self-pay | Admitting: Internal Medicine

## 2011-11-01 NOTE — Telephone Encounter (Signed)
11-01-11 LMM @ 434PM FOR PT TO HAVE A PACER CK/MT

## 2011-11-11 ENCOUNTER — Other Ambulatory Visit: Payer: Self-pay | Admitting: Internal Medicine

## 2011-11-12 NOTE — Telephone Encounter (Signed)
Done erx 

## 2011-12-19 ENCOUNTER — Other Ambulatory Visit: Payer: Self-pay

## 2011-12-19 NOTE — Telephone Encounter (Signed)
.   Requested Prescriptions   Pending Prescriptions Disp Refills  . ergocalciferol (VITAMIN D2) 50000 UNITS capsule 4 capsule 5    Sig: Take 1 capsule (50,000 Units total) by mouth once a week.   LOV: 01/02/11.

## 2011-12-20 MED ORDER — ERGOCALCIFEROL 1.25 MG (50000 UT) PO CAPS: 50000.0000 [IU] | ORAL_CAPSULE | ORAL | Status: DC

## 2012-01-08 NOTE — Telephone Encounter (Signed)
Patient called no answer.LMTC. 

## 2012-02-14 ENCOUNTER — Other Ambulatory Visit: Payer: Self-pay | Admitting: Cardiology

## 2012-02-15 ENCOUNTER — Other Ambulatory Visit: Payer: Self-pay | Admitting: *Deleted

## 2012-02-15 DIAGNOSIS — I10 Essential (primary) hypertension: Secondary | ICD-10-CM

## 2012-02-15 MED ORDER — TELMISARTAN 80 MG PO TABS: 80.0000 mg | ORAL_TABLET | Freq: Every day | ORAL | Status: DC

## 2012-02-20 NOTE — Telephone Encounter (Signed)
Received refill request from [facility] [address]for diazepam.Advised to send to PCP.

## 2012-02-22 ENCOUNTER — Encounter: Payer: Self-pay | Admitting: *Deleted

## 2012-02-22 DIAGNOSIS — Z950 Presence of cardiac pacemaker: Secondary | ICD-10-CM | POA: Insufficient documentation

## 2012-02-25 ENCOUNTER — Other Ambulatory Visit: Payer: Self-pay | Admitting: Cardiology

## 2012-02-25 ENCOUNTER — Telehealth: Payer: Self-pay

## 2012-02-25 NOTE — Telephone Encounter (Signed)
Received refill request from [facility] [address] for diazepam.Spoke to pharmacist advised to call PCP.

## 2012-02-26 ENCOUNTER — Other Ambulatory Visit: Payer: Self-pay | Admitting: Internal Medicine

## 2012-02-26 ENCOUNTER — Other Ambulatory Visit: Payer: Self-pay

## 2012-02-26 MED ORDER — DIAZEPAM 10 MG PO TABS: 10.0000 mg | ORAL_TABLET | ORAL | Status: DC

## 2012-02-26 NOTE — Telephone Encounter (Signed)
Faxed hardcopy to pharmacy. 

## 2012-02-26 NOTE — Telephone Encounter (Signed)
Done hardcopy to robin  

## 2012-02-29 ENCOUNTER — Encounter: Payer: Self-pay | Admitting: Internal Medicine

## 2012-02-29 ENCOUNTER — Ambulatory Visit (INDEPENDENT_AMBULATORY_CARE_PROVIDER_SITE_OTHER): Payer: Medicare Other | Admitting: Internal Medicine

## 2012-02-29 VITALS — BP 154/83 | HR 80 | Ht 66.0 in | Wt 179.0 lb

## 2012-02-29 DIAGNOSIS — I5022 Chronic systolic (congestive) heart failure: Secondary | ICD-10-CM

## 2012-02-29 DIAGNOSIS — I10 Essential (primary) hypertension: Secondary | ICD-10-CM

## 2012-02-29 DIAGNOSIS — Z950 Presence of cardiac pacemaker: Secondary | ICD-10-CM

## 2012-02-29 DIAGNOSIS — I442 Atrioventricular block, complete: Secondary | ICD-10-CM

## 2012-02-29 LAB — PACEMAKER DEVICE OBSERVATION
AL THRESHOLD: 1 V
BAMS-0001: 170 {beats}/min
BATTERY VOLTAGE: 2.9512 V
RV LEAD AMPLITUDE: 9.125 mv
RV LEAD THRESHOLD: 3.75 V

## 2012-02-29 NOTE — Progress Notes (Signed)
HPI Tiffany [name] returns today for followup. She is a very pleasant 78 year old woman with a long history of chronic systolic heart failure, status post biventricular pacemaker insertion, many years ago. She returns today for followup. She underwent generator change several weeks ago. One week ago, she was in the emergency room shortness of breath and was placed on oxygen. It sounds like her heart failure had worsened. No Known Allergies   Current Outpatient Prescriptions  Medication Sig Dispense Refill  . aspirin 325 MG EC tablet Take 325 mg by mouth daily. 2 tabs bid      . carvedilol (COREG) 25 MG tablet Take 1 tablet (25 mg total) by mouth 2 (two) times daily with a meal.  60 tablet  5  . cholecalciferol (VITAMIN D) 1000 UNITS tablet Take 1,000 Units by mouth daily.      . colchicine 0.6 MG tablet 1 tab by mouth every hour for acute gout until improved or diarrhea  As needed  60 tablet  1  . diazepam (VALIUM) 10 MG tablet Take 1 tablet (10 mg total) by mouth as directed.  30 tablet  0  . fish oil-omega-3 fatty acids 1000 MG capsule Take 2 g by mouth daily.        . magnesium 30 MG tablet Take 30 mg by mouth daily.      . Multiple Vitamin (MULTIVITAMIN) capsule Take 1 capsule by mouth daily.        . potassium gluconate (HM POTASSIUM) 595 MG TABS Take 595 mg by mouth daily.      Marland Kitchen telmisartan (MICARDIS) 80 MG tablet Take 1 tablet (80 mg total) by mouth daily.  30 tablet  0  . timolol (BETIMOL) 0.5 % ophthalmic solution Place 1 drop into both eyes 2 (two) times daily.        . traMADol (ULTRAM) 50 MG tablet take 1 tablet by mouth four times a day if needed  30 tablet  5  . travoprost, benzalkonium, (TRAVATAN) 0.004 % ophthalmic solution Place 1 drop into both eyes at bedtime.           Past Medical History  Diagnosis Date  . Status post biventricular pacemaker     with AICD; 2004; revision 2007  . Complete heart block   . Nonischemic cardiomyopathy     EF 45-50% in 2009  . Systolic heart  failure   . Macular degeneration   . Ventricular tachycardia   . Arthritis   . Glaucoma(365)   . Hyperlipidemia   . Essential hypertension, benign   . Hypothyroidism   . Osteoporosis   . Allergy   . Impaired glucose tolerance 02/02/2011  . Vitamin D deficiency 02/02/2011  . Hearing loss 02/02/2011    ROS:   All systems reviewed and negative except as noted in the HPI.   Past Surgical History  Procedure Date  . Cardiac defibrillator placement   . Cholecystectomy   . Appendectomy   . Tonsillectomy   . Cataract surgery      Family History  Problem Relation Age of Onset  . Hypertension Mother   . Coronary artery disease Mother   . Arthritis Other   . Cancer Other     lung cancer  . Heart disease Other   . Hypertension Other      History   Social History  . Marital Status: Widowed    Spouse Name: N/A    Number of Children: N/A  . Years of Education: N/A  Occupational History  . retired    Social History Main Topics  . Smoking status: Never Smoker   . Smokeless tobacco: Never Used  . Alcohol Use: No  . Drug Use: No  . Sexually Active: Not on file   Other Topics Concern  . Not on file   Social History Narrative  . No narrative on file     BP 154/83  Pulse 80  Ht 5\' 6"  (1.676 m)  Wt 179 lb (81.194 kg)  BMI 28.89 kg/m2  Physical Exam:  Well appearing elderly woman, NAD HEENT: Unremarkable Neck:  7 cm JVD, no thyromegally Lungs:  Clear with no wheezes, rales, or rhonchi. HEART:  Regular rate rhythm, no murmurs, no rubs, no clicks Abd:  soft, positive bowel sounds, no organomegally, no rebound, no guarding Ext:  2 plus pulses, no edema, no cyanosis, no clubbing Skin:  No rashes no nodules Neuro:  CN II through XII intact, motor grossly intact  DEVICE  Normal device function.  See PaceArt for details.   Assess/Plan:

## 2012-02-29 NOTE — Assessment & Plan Note (Signed)
Her blood pressure is well controlled. She is asked to maintain a low-sodium diet. She will continue her current medical therapy.

## 2012-02-29 NOTE — Assessment & Plan Note (Signed)
Her chronic systolic heart failure is class 2. I have asked her to increase her Torsemide for 3 days to 20 mg daily. She will increase her potassium intake.

## 2012-02-29 NOTE — Assessment & Plan Note (Signed)
Her BiV PPM is working normally. Will recheck in several months. 

## 2012-02-29 NOTE — Patient Instructions (Signed)
Your physician wants you to follow-up in: 12 months with Dr Taylor You will receive a reminder letter in the mail two months in advance. If you don't receive a letter, please call our office to schedule the follow-up appointment.   Remote monitoring is used to monitor your Pacemaker of ICD from home. This monitoring reduces the number of office visits required to check your device to one time per year. It allows us to keep an eye on the functioning of your device to ensure it is working properly. You are scheduled for a device check from home on 03/23/13. You may send your transmission at any time that day. If you have a wireless device, the transmission will be sent automatically. After your physician reviews your transmission, you will receive a postcard with your next transmission date.   

## 2012-03-04 ENCOUNTER — Other Ambulatory Visit: Payer: Self-pay | Admitting: Internal Medicine

## 2012-03-18 ENCOUNTER — Encounter: Payer: Self-pay | Admitting: Internal Medicine

## 2012-03-19 ENCOUNTER — Other Ambulatory Visit (INDEPENDENT_AMBULATORY_CARE_PROVIDER_SITE_OTHER): Payer: Medicare Other

## 2012-03-19 ENCOUNTER — Encounter: Payer: Self-pay | Admitting: Internal Medicine

## 2012-03-19 ENCOUNTER — Ambulatory Visit (INDEPENDENT_AMBULATORY_CARE_PROVIDER_SITE_OTHER): Payer: Medicare Other | Admitting: Internal Medicine

## 2012-03-19 VITALS — BP 162/80 | HR 73 | Temp 97.8°F | Ht 64.5 in | Wt 175.0 lb

## 2012-03-19 DIAGNOSIS — M129 Arthropathy, unspecified: Secondary | ICD-10-CM

## 2012-03-19 DIAGNOSIS — R7302 Impaired glucose tolerance (oral): Secondary | ICD-10-CM

## 2012-03-19 DIAGNOSIS — R7309 Other abnormal glucose: Secondary | ICD-10-CM

## 2012-03-19 DIAGNOSIS — I10 Essential (primary) hypertension: Secondary | ICD-10-CM

## 2012-03-19 DIAGNOSIS — M1990 Unspecified osteoarthritis, unspecified site: Secondary | ICD-10-CM

## 2012-03-19 DIAGNOSIS — E785 Hyperlipidemia, unspecified: Secondary | ICD-10-CM

## 2012-03-19 LAB — CBC WITH DIFFERENTIAL/PLATELET
Basophils Relative: 0.4 % (ref 0.0–3.0)
Eosinophils Relative: 4.5 % (ref 0.0–5.0)
Lymphocytes Relative: 37 % (ref 12.0–46.0)
MCV: 104.8 fl — ABNORMAL HIGH (ref 78.0–100.0)
Monocytes Relative: 6 % (ref 3.0–12.0)
Neutrophils Relative %: 52.1 % (ref 43.0–77.0)
RBC: 3.67 Mil/uL — ABNORMAL LOW (ref 3.87–5.11)
WBC: 8.4 10*3/uL (ref 4.5–10.5)

## 2012-03-19 MED ORDER — CARVEDILOL 25 MG PO TABS: 25.0000 mg | ORAL_TABLET | Freq: Two times a day (BID) | ORAL | Status: DC

## 2012-03-19 NOTE — Assessment & Plan Note (Signed)
stable overall by hx and exam, most recent data reviewed with pt, and pt to continue medical treatment as before Lab Results  Component Value Date   CHOL 246* 02/02/2011   Lab Results  Component Value Date   HDL 56.60 02/02/2011   No results found for this basename: [facility]   Lab Results  Component Value Date   TRIG 402.0* 02/02/2011   Lab Results  Component Value Date   CHOLHDL 4 02/02/2011   Lab Results  Component Value Date   LDLDIRECT 130.8 02/02/2011

## 2012-03-19 NOTE — Patient Instructions (Addendum)
Continue all other medications as before Your refills were sent to the pharmacy today Please go to LAB in the Basement for the blood and/or urine tests to be done today You will be contacted by phone if any changes need to be made immediately.  Otherwise, you will receive a letter about your results with an explanation. Please remember to sign up for My Chart at your earliest convenience, as this will be important to you in the future with finding out test results. Please return in 1 year for your yearly visit, or sooner if needed

## 2012-03-19 NOTE — Assessment & Plan Note (Signed)
Mild elev today, stable overall by hx and exam, most recent data reviewed with pt, and pt to continue medical treatment as before BP Readings from Last 3 Encounters:  03/19/12 162/80  02/29/12 154/83  02/02/11 102/70

## 2012-03-19 NOTE — Assessment & Plan Note (Signed)
stable overall by hx and exam, most recent data reviewed with pt, and pt to continue medical treatment as before Lab Results  Component Value Date   WBC 7.4 02/02/2011   HGB 12.3 02/02/2011   HCT 36.2 02/02/2011   PLT 155.0 02/02/2011   GLUCOSE 102* 02/02/2011   CHOL 246* 02/02/2011   TRIG 402.0* 02/02/2011   HDL 56.60 02/02/2011   LDLDIRECT 130.8 02/02/2011   ALT 18 02/02/2011   AST 23 02/02/2011   NA 139 02/02/2011   K 4.5 02/02/2011   CL 106 02/02/2011   CREATININE 1.2 02/02/2011   BUN 17 02/02/2011   CO2 25 02/02/2011   TSH 4.04 02/02/2011   INR 1.0 08/30/2010

## 2012-03-19 NOTE — Assessment & Plan Note (Signed)
stable overall by hx and exam, most recent data reviewed with pt, and pt to continue medical treatment as before, for tylenol arthritis prn, declines ortho eval for left shoulder

## 2012-03-19 NOTE — Progress Notes (Signed)
Subjective:    Patient ID: Tiffany [name], female    DOB: 1922-05-31, 78 y.o.   MRN: [id number]  HPI  Here with son, Here to f/u; overall doing ok,  Pt denies chest pain, increased sob or doe, wheezing, orthopnea, PND, increased LE swelling, palpitations, dizziness or syncope.  Pt denies new neurological symptoms such as new headache, or facial or extremity weakness or numbness   Pt denies polydipsia, polyuria, or low sugar symptoms such as weakness or confusion improved with po intake.  Pt states overall good compliance with meds, trying to follow lower cholesterol, diabetic diet, wt overall stable but little exercise however.  Denies worsening depressive symptoms, suicidal ideation, or panic.  Overall good compliance with treatment, and good medicine tolerability.  Has ongoing severe hearing loss right > left.  Has bilat shoulder pain left > right with decreased left ROM but chronic for yrs, does not want ortho eval or other tx.  Recent BP control OK per cardiology.  Past Medical History  Diagnosis Date  . Status post biventricular pacemaker     with AICD; 2004; revision 2007  . Complete heart block   . Nonischemic cardiomyopathy     EF 45-50% in 2009  . Systolic heart failure   . Macular degeneration   . Ventricular tachycardia   . Arthritis   . Glaucoma(365)   . Hyperlipidemia   . Essential hypertension, benign   . Hypothyroidism   . Osteoporosis   . Allergy   . Impaired glucose tolerance 02/02/2011  . Vitamin D deficiency 02/02/2011  . Hearing loss 02/02/2011   Past Surgical History  Procedure Date  . Cardiac defibrillator placement   . Cholecystectomy   . Appendectomy   . Tonsillectomy   . Cataract surgery     reports that she has never smoked. She has never used smokeless tobacco. She reports that she does not drink alcohol or use illicit drugs. family history includes Arthritis in her other; Cancer in her other; Coronary artery disease in her mother; Heart disease in her other;  and Hypertension in her mother and other. No Known Allergies Current Outpatient Prescriptions on File Prior to Visit  Medication Sig Dispense Refill  . aspirin 325 MG EC tablet Take 325 mg by mouth daily. 2 tabs bid      . cholecalciferol (VITAMIN D) 1000 UNITS tablet Take 1,000 Units by mouth daily.      Marland Kitchen COLCRYS 0.6 MG tablet take 1 tablet by mouth every 12 hours if needed as directed  30 tablet  0  . diazepam (VALIUM) 10 MG tablet Take 1 tablet (10 mg total) by mouth as directed.  30 tablet  0  . fish oil-omega-3 fatty acids 1000 MG capsule Take 2 g by mouth daily.        . magnesium 30 MG tablet Take 30 mg by mouth daily.      . Multiple Vitamin (MULTIVITAMIN) capsule Take 1 capsule by mouth daily.        . potassium gluconate (HM POTASSIUM) 595 MG TABS Take 595 mg by mouth daily.      Marland Kitchen telmisartan (MICARDIS) 80 MG tablet Take 1 tablet (80 mg total) by mouth daily.  30 tablet  0  . traMADol (ULTRAM) 50 MG tablet take 1 tablet by mouth four times a day if needed  30 tablet  5  . travoprost, benzalkonium, (TRAVATAN) 0.004 % ophthalmic solution Place 1 drop into both eyes at bedtime.        Marland Kitchen  DISCONTD: carvedilol (COREG) 25 MG tablet Take 1 tablet (25 mg total) by mouth 2 (two) times daily with a meal.  60 tablet  5  . timolol (BETIMOL) 0.5 % ophthalmic solution Place 1 drop into both eyes 2 (two) times daily.         Review of Systems  Constitutional: Negative for diaphoresis and unexpected weight change.  HENT: Negative for tinnitus.   Eyes: Negative for photophobia and visual disturbance.  Respiratory: Negative for choking and stridor.   Gastrointestinal: Negative for vomiting and blood in stool.  Genitourinary: Negative for hematuria and decreased urine volume.  Musculoskeletal: Negative for gait problem.  Skin: Negative for color change and wound.  Neurological: Negative for tremors and numbness.  Psychiatric/Behavioral: Negative for decreased concentration. The patient is not  hyperactive.       Objective:   Physical Exam BP 162/80  Pulse 73  Temp 97.8 F (36.6 C) (Oral)  Ht 5' 4.5" (1.638 m)  Wt 175 lb (79.379 kg)  BMI 29.57 kg/m2  SpO2 95% Physical Exam  VS noted Constitutional: Pt appears well-developed and well-nourished.  HENT: Head: Normocephalic.  Right Ear: External ear normal.  Left Ear: External ear normal.  Eyes: Conjunctivae and EOM are normal. Pupils are equal, round, and reactive to light.  Neck: Normal range of motion. Neck supple.  Cardiovascular: Normal rate and regular rhythm.   Pulmonary/Chest: Effort normal and breath sounds normal.  Abd:  Soft, NT, non-distended, + BS Neurological: Pt is alert. Not confused , motor/gait intact Skin: Skin is warm. No erythema. No rash Has mild OA changes to hand/fingers; no active synovitis Psychiatric: Pt behavior is normal. Thought content normal. - good cognition for age    Assessment & Plan:

## 2012-03-20 ENCOUNTER — Encounter: Payer: Self-pay | Admitting: Internal Medicine

## 2012-03-20 LAB — HEPATIC FUNCTION PANEL
Alkaline Phosphatase: 66 U/L (ref 39–117)
Bilirubin, Direct: 0.1 mg/dL (ref 0.0–0.3)
Total Bilirubin: 0.4 mg/dL (ref 0.3–1.2)

## 2012-03-20 LAB — BASIC METABOLIC PANEL
CO2: 27 mEq/L (ref 19–32)
Calcium: 9.9 mg/dL (ref 8.4–10.5)
GFR: 44.66 mL/min — ABNORMAL LOW (ref >60.00)
Sodium: 139 mEq/L (ref 135–145)

## 2012-03-20 LAB — LIPID PANEL
HDL: 54.2 mg/dL (ref >39.00)
Total CHOL/HDL Ratio: 4
Triglycerides: 287 mg/dL — ABNORMAL HIGH (ref 0.0–149.0)
VLDL: 57.4 mg/dL — ABNORMAL HIGH (ref 0.0–40.0)

## 2012-03-20 LAB — LDL CHOLESTEROL, DIRECT: Direct LDL: 111.4 mg/dL

## 2012-04-14 ENCOUNTER — Other Ambulatory Visit: Payer: Self-pay | Admitting: Internal Medicine

## 2012-04-14 NOTE — Telephone Encounter (Signed)
Done erx 

## 2012-04-15 ENCOUNTER — Other Ambulatory Visit: Payer: Self-pay | Admitting: Internal Medicine

## 2012-04-15 DIAGNOSIS — I10 Essential (primary) hypertension: Secondary | ICD-10-CM

## 2012-04-15 MED ORDER — TELMISARTAN 80 MG PO TABS: 80.0000 mg | ORAL_TABLET | Freq: Every day | ORAL | Status: DC

## 2012-06-02 ENCOUNTER — Ambulatory Visit (INDEPENDENT_AMBULATORY_CARE_PROVIDER_SITE_OTHER): Payer: Medicare Other | Admitting: *Deleted

## 2012-06-02 DIAGNOSIS — Z950 Presence of cardiac pacemaker: Secondary | ICD-10-CM

## 2012-06-02 DIAGNOSIS — I442 Atrioventricular block, complete: Secondary | ICD-10-CM

## 2012-06-02 LAB — REMOTE PACEMAKER DEVICE
ATRIAL PACING PM: 57.86
BRDY-0003LV: 130 {beats}/min
BRDY-0004LV: 120 {beats}/min
LV LEAD THRESHOLD: 1.75 V
RV LEAD AMPLITUDE: 9.125 mv

## 2012-06-04 ENCOUNTER — Encounter: Payer: Self-pay | Admitting: *Deleted

## 2012-06-16 ENCOUNTER — Telehealth: Payer: Self-pay | Admitting: *Deleted

## 2012-06-16 MED ORDER — CYCLOBENZAPRINE HCL 5 MG PO TABS: 5.0000 mg | ORAL_TABLET | Freq: Three times a day (TID) | ORAL | Status: DC | PRN

## 2012-06-16 NOTE — Telephone Encounter (Signed)
PATIENT CALLED AND STATES FOR 2 WEEKS OR MORE SHE HAS HAD SHOULDERS SORE, BOTH UPPER ARMS, NECK PAIN, CAN NOT RAISE HER ARMS,. HAS USED ICY HOT RUB AND TRAMADOL. HER SON SUGGESTED MAYBE A MUSCLE RELAXANT TO HELP. PLEASE ADVISE. CB#[phone]

## 2012-06-16 NOTE — Telephone Encounter (Signed)
Ok for flexeril prn, but would need OV if does not help

## 2012-06-17 NOTE — Telephone Encounter (Signed)
Patient informed. 

## 2012-06-17 NOTE — Telephone Encounter (Signed)
Called left message to call back 

## 2012-06-19 ENCOUNTER — Encounter: Payer: Self-pay | Admitting: Internal Medicine

## 2012-07-29 ENCOUNTER — Other Ambulatory Visit: Payer: Self-pay

## 2012-08-07 ENCOUNTER — Telehealth: Payer: Self-pay

## 2012-08-07 ENCOUNTER — Other Ambulatory Visit: Payer: Self-pay | Admitting: Internal Medicine

## 2012-08-07 NOTE — Telephone Encounter (Signed)
Called [facility] pharmacy and told them Vitamin D refill needs to be sent to PCP

## 2012-09-01 ENCOUNTER — Encounter: Payer: Medicare Other | Admitting: *Deleted

## 2012-09-10 ENCOUNTER — Encounter: Payer: Self-pay | Admitting: *Deleted

## 2012-09-24 ENCOUNTER — Encounter: Payer: Self-pay | Admitting: Internal Medicine

## 2012-09-24 ENCOUNTER — Ambulatory Visit (INDEPENDENT_AMBULATORY_CARE_PROVIDER_SITE_OTHER): Payer: Medicare Other | Admitting: *Deleted

## 2012-09-24 DIAGNOSIS — I442 Atrioventricular block, complete: Secondary | ICD-10-CM

## 2012-09-24 DIAGNOSIS — Z950 Presence of cardiac pacemaker: Secondary | ICD-10-CM

## 2012-10-01 LAB — REMOTE PACEMAKER DEVICE
BATTERY VOLTAGE: 2.926 V
RV LEAD AMPLITUDE: 9.1 mv
VENTRICULAR PACING PM: 99.93

## 2012-10-09 ENCOUNTER — Encounter: Payer: Self-pay | Admitting: *Deleted

## 2012-12-02 ENCOUNTER — Other Ambulatory Visit: Payer: Self-pay | Admitting: *Deleted

## 2012-12-02 DIAGNOSIS — I10 Essential (primary) hypertension: Secondary | ICD-10-CM

## 2012-12-02 MED ORDER — TELMISARTAN 80 MG PO TABS: 80.0000 mg | ORAL_TABLET | Freq: Every day | ORAL | Status: DC

## 2012-12-07 ENCOUNTER — Other Ambulatory Visit: Payer: Self-pay | Admitting: Internal Medicine

## 2012-12-29 ENCOUNTER — Encounter: Payer: Medicare Other | Admitting: *Deleted

## 2013-01-02 ENCOUNTER — Encounter: Payer: Self-pay | Admitting: *Deleted

## 2013-01-09 ENCOUNTER — Encounter: Payer: Self-pay | Admitting: Internal Medicine

## 2013-01-09 ENCOUNTER — Ambulatory Visit (INDEPENDENT_AMBULATORY_CARE_PROVIDER_SITE_OTHER): Payer: Medicare Other | Admitting: *Deleted

## 2013-01-09 DIAGNOSIS — Z950 Presence of cardiac pacemaker: Secondary | ICD-10-CM

## 2013-01-09 DIAGNOSIS — I442 Atrioventricular block, complete: Secondary | ICD-10-CM

## 2013-01-23 LAB — REMOTE PACEMAKER DEVICE
AL IMPEDENCE PM: 456 Ohm
ATRIAL PACING PM: 49.49
BATTERY VOLTAGE: 2.9099 V
BRDY-0003LV: 130 {beats}/min
BRDY-0004LV: 120 {beats}/min
LV LEAD THRESHOLD: 1.375 V
RV LEAD IMPEDENCE PM: 4047 Ohm
VENTRICULAR PACING PM: 99.95

## 2013-02-11 ENCOUNTER — Encounter: Payer: Self-pay | Admitting: *Deleted

## 2013-02-18 ENCOUNTER — Other Ambulatory Visit: Payer: Self-pay | Admitting: Internal Medicine

## 2013-02-18 NOTE — Telephone Encounter (Signed)
Last OV oct 2013  Done hardcopy to robin  Please make ROV for further refills

## 2013-02-18 NOTE — Telephone Encounter (Signed)
Faxed hardcopy to [facility] [address] [place] 

## 2013-03-05 ENCOUNTER — Encounter: Payer: Self-pay | Admitting: Internal Medicine

## 2013-03-05 ENCOUNTER — Ambulatory Visit (INDEPENDENT_AMBULATORY_CARE_PROVIDER_SITE_OTHER): Payer: Medicare Other | Admitting: Internal Medicine

## 2013-03-05 VITALS — BP 142/73 | HR 67 | Ht 64.5 in | Wt 168.0 lb

## 2013-03-05 DIAGNOSIS — I442 Atrioventricular block, complete: Secondary | ICD-10-CM

## 2013-03-05 DIAGNOSIS — I472 Ventricular tachycardia: Secondary | ICD-10-CM

## 2013-03-05 DIAGNOSIS — I5022 Chronic systolic (congestive) heart failure: Secondary | ICD-10-CM

## 2013-03-05 DIAGNOSIS — Z950 Presence of cardiac pacemaker: Secondary | ICD-10-CM

## 2013-03-05 DIAGNOSIS — I428 Other cardiomyopathies: Secondary | ICD-10-CM

## 2013-03-05 DIAGNOSIS — I10 Essential (primary) hypertension: Secondary | ICD-10-CM

## 2013-03-05 DIAGNOSIS — I5020 Unspecified systolic (congestive) heart failure: Secondary | ICD-10-CM

## 2013-03-05 NOTE — Patient Instructions (Signed)
Your physician wants you to follow-up in: 6 months with Dr Taylor You will receive a reminder letter in the mail two months in advance. If you don't receive a letter, please call our office to schedule the follow-up appointment.  

## 2013-03-05 NOTE — Assessment & Plan Note (Signed)
Her chronic systolic heart failure is class II. She'll continue her current medical therapy, and I've encouraged the patient to maintain a low-sodium diet.

## 2013-03-05 NOTE — Progress Notes (Signed)
HPI Tiffany [name] returns today for followup. She is a very pleasant 79 year old woman with a history of chronic systolic heart failure, complete heart block, status post biventricular ICD implantation with change out to a biventricular pacemaker several years ago. She has chronically elevated pacing thresholds in both the right ventricular as well as the left ventricular pacing leads. The patient has had no syncope. She has class II heart failure symptoms. She has trace swelling in her lower extremities. No Known Allergies   Current Outpatient Prescriptions  Medication Sig Dispense Refill  . aspirin 325 MG EC tablet Take 325 mg by mouth daily. 2 tabs bid      . carvedilol (COREG) 25 MG tablet Take 1 tablet (25 mg total) by mouth 2 (two) times daily with a meal.  60 tablet  11  . cholecalciferol (VITAMIN D) 1000 UNITS tablet Take 1,000 Units by mouth daily.      Marland Kitchen COLCRYS 0.6 MG tablet take 1 tablet by mouth every 12 hours if needed (NEED APPT FOR MORE REFILLS)  30 tablet  0  . cyclobenzaprine (FLEXERIL) 5 MG tablet Take 1 tablet (5 mg total) by mouth 3 (three) times daily as needed for muscle spasms.  60 tablet  1  . diazepam (VALIUM) 10 MG tablet Take 1 tablet (10 mg total) by mouth as directed.  30 tablet  0  . fish oil-omega-3 fatty acids 1000 MG capsule Take 2 g by mouth daily.        . magnesium 30 MG tablet Take 30 mg by mouth daily.      . Multiple Vitamin (MULTIVITAMIN) capsule Take 1 capsule by mouth daily.        . potassium gluconate (HM POTASSIUM) 595 MG TABS Take 595 mg by mouth daily.      Marland Kitchen telmisartan (MICARDIS) 80 MG tablet Take 1 tablet (80 mg total) by mouth daily.  30 tablet  3  . timolol (BETIMOL) 0.5 % ophthalmic solution Place 1 drop into both eyes 2 (two) times daily.        . traMADol (ULTRAM) 50 MG tablet take 1 tablet by mouth four times a day if needed  120 tablet  0  . travoprost, benzalkonium, (TRAVATAN) 0.004 % ophthalmic solution Place 1 drop into both eyes  at bedtime.        . Vitamin D, Ergocalciferol, (DRISDOL) 50000 UNITS CAPS take 1 capsule by mouth every week  4 capsule  5   No current facility-administered medications for this visit.     Past Medical History  Diagnosis Date  . Status post biventricular pacemaker     with AICD; 2004; revision 2007  . Complete heart block   . Nonischemic cardiomyopathy     EF 45-50% in 2009  . Systolic heart failure   . Macular degeneration   . Ventricular tachycardia   . Arthritis   . Glaucoma   . Hyperlipidemia   . Essential hypertension, benign   . Hypothyroidism   . Osteoporosis   . Allergy   . Impaired glucose tolerance 02/02/2011  . Vitamin D deficiency 02/02/2011  . Hearing loss 02/02/2011    ROS:   All systems reviewed and negative except as noted in the HPI.   Past Surgical History  Procedure Laterality Date  . Cardiac defibrillator placement    . Cholecystectomy    . Appendectomy    . Tonsillectomy    . Cataract surgery       Family  History  Problem Relation Age of Onset  . Hypertension Mother   . Coronary artery disease Mother   . Arthritis Other   . Cancer Other     lung cancer  . Heart disease Other   . Hypertension Other      History   Social History  . Marital Status: Widowed    Spouse Name: N/A    Number of Children: N/A  . Years of Education: N/A   Occupational History  . retired    Social History Main Topics  . Smoking status: Never Smoker   . Smokeless tobacco: Never Used  . Alcohol Use: No  . Drug Use: No  . Sexual Activity: Not on file   Other Topics Concern  . Not on file   Social History Narrative  . No narrative on file     BP 142/73  Pulse 67  Ht 5' 4.5" (1.638 m)  Wt 168 lb (76.204 kg)  BMI 28.4 kg/m2  Physical Exam:  Well appearing elderly woman,NAD HEENT: Unremarkable Neck:  No JVD, no thyromegally Back:  No CVA tenderness Lungs:  Clear with no wheezes, rales, or rhonchi. HEART:  Regular rate rhythm, no murmurs,  no rubs, no clicks Abd:  soft, positive bowel sounds, no organomegally, no rebound, no guarding Ext:  2 plus pulses, no edema, no cyanosis, no clubbing Skin:  No rashes no nodules Neuro:  CN II through XII intact, motor grossly intact   DEVICE  Normal device function.  See PaceArt for details.   Assess/Plan:

## 2013-03-05 NOTE — Assessment & Plan Note (Signed)
Her [employer]tr[employer] ventricular pacemaker is stable. The patient thresholds are elevated in both the left and right ventricle. She is approximately one year of battery longevity. We'll plan to recheck in several months.

## 2013-03-06 LAB — PACEMAKER DEVICE OBSERVATION
AL AMPLITUDE: 0.375 mv
AL IMPEDENCE PM: 475 Ohm
BATTERY VOLTAGE: 2.8961 V
BRDY-0002RV: 60 {beats}/min
RV LEAD AMPLITUDE: 9.125 mv
VENTRICULAR PACING PM: 99.94

## 2013-03-27 ENCOUNTER — Observation Stay (HOSPITAL_COMMUNITY): Payer: Medicare Other

## 2013-03-27 ENCOUNTER — Observation Stay (HOSPITAL_COMMUNITY)
Admission: EM | Admit: 2013-03-27 | Discharge: 2013-03-30 | Disposition: A | Discharge status: Home / Self Care | Payer: Medicare Other | Attending: Internal Medicine | Admitting: Internal Medicine

## 2013-03-27 ENCOUNTER — Emergency Department (HOSPITAL_COMMUNITY): Payer: Medicare Other

## 2013-03-27 ENCOUNTER — Encounter (HOSPITAL_COMMUNITY): Payer: Self-pay | Admitting: Emergency Medicine

## 2013-03-27 DIAGNOSIS — R7309 Other abnormal glucose: Secondary | ICD-10-CM

## 2013-03-27 DIAGNOSIS — E559 Vitamin D deficiency, unspecified: Secondary | ICD-10-CM | POA: Insufficient documentation

## 2013-03-27 DIAGNOSIS — E039 Hypothyroidism, unspecified: Secondary | ICD-10-CM | POA: Insufficient documentation

## 2013-03-27 DIAGNOSIS — I442 Atrioventricular block, complete: Secondary | ICD-10-CM

## 2013-03-27 DIAGNOSIS — I4729 Other ventricular tachycardia: Secondary | ICD-10-CM | POA: Insufficient documentation

## 2013-03-27 DIAGNOSIS — I517 Cardiomegaly: Secondary | ICD-10-CM

## 2013-03-27 DIAGNOSIS — M810 Age-related osteoporosis without current pathological fracture: Secondary | ICD-10-CM | POA: Insufficient documentation

## 2013-03-27 DIAGNOSIS — R51 Headache: Secondary | ICD-10-CM | POA: Insufficient documentation

## 2013-03-27 DIAGNOSIS — R55 Syncope and collapse: Principal | ICD-10-CM | POA: Insufficient documentation

## 2013-03-27 DIAGNOSIS — N390 Urinary tract infection, site not specified: Secondary | ICD-10-CM | POA: Diagnosis present

## 2013-03-27 DIAGNOSIS — H3530 Unspecified macular degeneration: Secondary | ICD-10-CM | POA: Insufficient documentation

## 2013-03-27 DIAGNOSIS — H409 Unspecified glaucoma: Secondary | ICD-10-CM | POA: Insufficient documentation

## 2013-03-27 DIAGNOSIS — H902 Conductive hearing loss, unspecified: Secondary | ICD-10-CM | POA: Insufficient documentation

## 2013-03-27 DIAGNOSIS — W19XXXD Unspecified fall, subsequent encounter: Secondary | ICD-10-CM

## 2013-03-27 DIAGNOSIS — Z7982 Long term (current) use of aspirin: Secondary | ICD-10-CM | POA: Insufficient documentation

## 2013-03-27 DIAGNOSIS — Z0000 Encounter for general adult medical examination without abnormal findings: Secondary | ICD-10-CM

## 2013-03-27 DIAGNOSIS — E785 Hyperlipidemia, unspecified: Secondary | ICD-10-CM | POA: Insufficient documentation

## 2013-03-27 DIAGNOSIS — Z95810 Presence of automatic (implantable) cardiac defibrillator: Secondary | ICD-10-CM | POA: Insufficient documentation

## 2013-03-27 DIAGNOSIS — I472 Ventricular tachycardia, unspecified: Secondary | ICD-10-CM | POA: Insufficient documentation

## 2013-03-27 DIAGNOSIS — I5020 Unspecified systolic (congestive) heart failure: Secondary | ICD-10-CM

## 2013-03-27 DIAGNOSIS — G4700 Insomnia, unspecified: Secondary | ICD-10-CM

## 2013-03-27 DIAGNOSIS — R42 Dizziness and giddiness: Secondary | ICD-10-CM | POA: Insufficient documentation

## 2013-03-27 DIAGNOSIS — W19XXXA Unspecified fall, initial encounter: Secondary | ICD-10-CM

## 2013-03-27 DIAGNOSIS — Z79899 Other long term (current) drug therapy: Secondary | ICD-10-CM | POA: Insufficient documentation

## 2013-03-27 DIAGNOSIS — G8911 Acute pain due to trauma: Secondary | ICD-10-CM | POA: Insufficient documentation

## 2013-03-27 DIAGNOSIS — M1990 Unspecified osteoarthritis, unspecified site: Secondary | ICD-10-CM

## 2013-03-27 DIAGNOSIS — I428 Other cardiomyopathies: Secondary | ICD-10-CM

## 2013-03-27 DIAGNOSIS — M129 Arthropathy, unspecified: Secondary | ICD-10-CM | POA: Insufficient documentation

## 2013-03-27 DIAGNOSIS — I10 Essential (primary) hypertension: Secondary | ICD-10-CM | POA: Diagnosis present

## 2013-03-27 DIAGNOSIS — R7302 Impaired glucose tolerance (oral): Secondary | ICD-10-CM

## 2013-03-27 DIAGNOSIS — Z950 Presence of cardiac pacemaker: Secondary | ICD-10-CM | POA: Diagnosis present

## 2013-03-27 LAB — URINALYSIS, ROUTINE W REFLEX MICROSCOPIC
Bilirubin Urine: NEGATIVE
Bilirubin Urine: NEGATIVE
Glucose, UA: NEGATIVE mg/dL
Glucose, UA: NEGATIVE mg/dL
Hgb urine dipstick: NEGATIVE
Ketones, ur: NEGATIVE mg/dL
Protein, ur: NEGATIVE mg/dL
Specific Gravity, Urine: 1.011 (ref 1.005–1.030)
Specific Gravity, Urine: 1.02 (ref 1.005–1.030)
pH: 5 (ref 5.0–8.0)

## 2013-03-27 LAB — CBC WITH DIFFERENTIAL/PLATELET
Basophils Relative: 1 % (ref 0–1)
Eosinophils Relative: 3 % (ref 0–5)
Hemoglobin: 13.2 g/dL (ref 12.0–15.0)
MCHC: 35.9 g/dL (ref 30.0–36.0)
MCV: 98.7 fL (ref 78.0–100.0)
Monocytes Absolute: 0.6 10*3/uL (ref 0.1–1.0)
Monocytes Relative: 8 % (ref 3–12)
Neutro Abs: 3.1 10*3/uL (ref 1.7–7.7)
Neutrophils Relative %: 41 % — ABNORMAL LOW (ref 43–77)
RBC: 3.73 MIL/uL — ABNORMAL LOW (ref 3.87–5.11)
WBC: 7.4 10*3/uL (ref 4.0–10.5)

## 2013-03-27 LAB — FOLATE: Folate: >20 ng/mL

## 2013-03-27 LAB — COMPREHENSIVE METABOLIC PANEL
AST: 22 U/L (ref 0–37)
Albumin: 3.2 g/dL — ABNORMAL LOW (ref 3.5–5.2)
Alkaline Phosphatase: 66 U/L (ref 39–117)
BUN: 27 mg/dL — ABNORMAL HIGH (ref 6–23)
Calcium: 10.2 mg/dL (ref 8.4–10.5)
Chloride: 102 mEq/L (ref 96–112)
Potassium: 3.7 mEq/L (ref 3.5–5.1)
Total Bilirubin: 0.4 mg/dL (ref 0.3–1.2)

## 2013-03-27 LAB — GLUCOSE, CAPILLARY
Glucose-Capillary: 106 mg/dL — ABNORMAL HIGH (ref 70–99)
Glucose-Capillary: 105 mg/dL — ABNORMAL HIGH (ref 70–99)
Glucose-Capillary: 116 mg/dL — ABNORMAL HIGH (ref 70–99)

## 2013-03-27 LAB — URINE MICROSCOPIC-ADD ON

## 2013-03-27 LAB — LIPID PANEL
Cholesterol: 234 mg/dL — ABNORMAL HIGH (ref 0–200)
HDL: 50 mg/dL (ref >39)
LDL Cholesterol: 129 mg/dL — ABNORMAL HIGH (ref 0–99)
Triglycerides: 275 mg/dL — ABNORMAL HIGH (ref <150)
VLDL: 55 mg/dL — ABNORMAL HIGH (ref 0–40)

## 2013-03-27 LAB — VITAMIN B12: Vitamin B-12: 768 pg/mL (ref 211–911)

## 2013-03-27 LAB — TROPONIN I: Troponin I: <0.3 ng/mL (ref <0.30)

## 2013-03-27 LAB — CORTISOL: Cortisol, Plasma: 7.2 ug/dL

## 2013-03-27 MED ORDER — ASPIRIN EC 325 MG PO TBEC
325.0000 mg | DELAYED_RELEASE_TABLET | Freq: Every day | ORAL | Status: DC
  Administered 2013-03-27 – 2013-03-30 (×4): 325 mg via ORAL
  Filled 2013-03-27 (×4): qty 1

## 2013-03-27 MED ORDER — CARVEDILOL 25 MG PO TABS
25.0000 mg | ORAL_TABLET | Freq: Two times a day (BID) | ORAL | Status: DC
  Administered 2013-03-27 (×2): 25 mg via ORAL
  Filled 2013-03-27 (×5): qty 1

## 2013-03-27 MED ORDER — TRAMADOL HCL 50 MG PO TABS
50.0000 mg | ORAL_TABLET | Freq: Four times a day (QID) | ORAL | Status: DC | PRN
  Administered 2013-03-27 – 2013-03-29 (×7): 50 mg via ORAL
  Filled 2013-03-27 (×7): qty 1

## 2013-03-27 MED ORDER — SODIUM CHLORIDE 0.9 % IJ SOLN
3.0000 mL | Freq: Two times a day (BID) | INTRAMUSCULAR | Status: DC
  Administered 2013-03-27 – 2013-03-29 (×6): 3 mL via INTRAVENOUS

## 2013-03-27 MED ORDER — CIPROFLOXACIN HCL 250 MG PO TABS
250.0000 mg | ORAL_TABLET | Freq: Two times a day (BID) | ORAL | Status: DC
  Administered 2013-03-27 – 2013-03-29 (×5): 250 mg via ORAL
  Filled 2013-03-27 (×7): qty 1

## 2013-03-27 MED ORDER — HEPARIN SODIUM (PORCINE) 5000 UNIT/ML IJ SOLN
5000.0000 [IU] | Freq: Three times a day (TID) | INTRAMUSCULAR | Status: DC
  Administered 2013-03-27 – 2013-03-30 (×9): 5000 [IU] via SUBCUTANEOUS
  Filled 2013-03-27 (×13): qty 1

## 2013-03-27 MED ORDER — ONDANSETRON HCL 4 MG/2ML IJ SOLN: 4.0000 mg | Freq: Three times a day (TID) | INTRAMUSCULAR | Status: AC | PRN

## 2013-03-27 MED ORDER — LEVOTHYROXINE SODIUM 50 MCG PO TABS
50.0000 ug | ORAL_TABLET | Freq: Every day | ORAL | Status: DC
  Administered 2013-03-27 – 2013-03-30 (×4): 50 ug via ORAL
  Filled 2013-03-27 (×6): qty 1

## 2013-03-27 NOTE — ED Notes (Signed)
PER EMS: pt from home, pt reports she woke up to feed her cat, felt dizzy and fell on her bottom, went back to bedroom and called her son who called EMS. Pt states she hit her head on the dishwasher, no LOC. Pt A&Ox4, no neuro deficits noted. Pt has pacemaker and son reports that last time she had a syncopal episode a pacemaker wire was loose. Pt denies pain. VS: BP-169/80, HR-93, RR-18, O2-99% RA, CBG-116, 20g RAC.

## 2013-03-27 NOTE — Progress Notes (Addendum)
Patient ID: Tiffany [name]  female  ZOX:[id number]    DOB: [birth year]    DOA: 03/27/2013  PCP: Oliver Barre, MD  Assessment/Plan: Principal Problem:   Near syncope recurrent episodes of dizziness in the last few weeks, worse in the last 3 days, pacemaker dependent. No syncopal episode,  has biventricular pacemaker, due to complete heart block. AICD in 2004, revision in 2007  -Troponins negative , called EP/cardiology consult, her pacemaker needs to be interrogated as well as further recommendations per cardiology   Active Problems:   Essential hypertension, benign - Currently stable  Mild acute kidney injury possibly due to UTI, meds - Holding Micardis for now, treat UTI    Hypothyroidism - Continue Synthroid    Nonischemic cardiomyopathy, last 2-D echo in the chart is from 2004 with EF of 20-25%, severe diffuse left ventricle hypokinesis, per Dr. Lubertha Basque note, EF 40-50% in 2009, history of chronic systolic CHF - Currently on aspirin, beta blocker  UTI - Follow urine culture, continue ciprofloxacin    DVT Prophylaxis: heparin subcutaneous   Code Status:  Disposition:    Subjective:  at this time no dizziness lightheadedness chest pain or shortness of breath.  Objective: Weight change:   Intake/Output Summary (Last 24 hours) at 03/27/13 1203 Last data filed at 03/27/13 0900  Gross per 24 hour  Intake    240 ml  Output      0 ml  Net    240 ml   Blood pressure 140/55, pulse 69, temperature 98.1 F (36.7 C), temperature source Oral, resp. rate 18, height 5' 4.5" (1.638 m), weight 75.705 kg (166 lb 14.4 oz), SpO2 96.00%.  Physical Exam: General: Alert and awake, oriented x3, not in any acute distress. CVS: S1-S2 clear, no murmur rubs or gallops Chest: clear to auscultation bilaterally, no wheezing, rales or rhonchi Abdomen: soft nontender, nondistended, normal bowel sounds  Extremities: no cyanosis, clubbing or edema noted bilaterally Neuro: Cranial nerves II-XII intact,  no focal neurological deficits  Lab Results: Basic Metabolic Panel:  Recent Labs Lab 03/27/13 0250  NA 138  K 3.7  CL 102  CO2 26  GLUCOSE 108*  BUN 27*  CREATININE 1.30*  CALCIUM 10.2   Liver Function Tests:  Recent Labs Lab 03/27/13 0250  AST 22  ALT 14  ALKPHOS 66  BILITOT 0.4  PROT 6.5  ALBUMIN 3.2*   No results found for this basename: LIPASE, AMYLASE,  in the last 168 hours No results found for this basename: AMMONIA,  in the last 168 hours CBC:  Recent Labs Lab 03/27/13 0250  WBC 7.4  NEUTROABS 3.1  HGB 13.2  HCT 36.8  MCV 98.7  PLT 156   Cardiac Enzymes:  Recent Labs Lab 03/27/13 0825  TROPONINI <0.30   BNP: No components found with this basename: POCBNP,  CBG:  Recent Labs Lab 03/27/13 0809  GLUCAP 102*     Micro Results: No results found for this or any previous visit (from the past 240 hour(s)).  Studies/Results: Ct Head Wo Contrast  03/27/2013   CLINICAL DATA:  Dizziness, syncope  EXAM: CT HEAD WITHOUT CONTRAST  TECHNIQUE: Contiguous axial images were obtained from the base of the skull through the vertex without intravenous contrast.  COMPARISON:  Prior CT from 09/22/2003  FINDINGS: There is no acute intracranial hemorrhage or large vessel territory infarct. The CSF containing spaces are within normal limits. No extra-axial fluid collection. No mass lesion or midline shift. Gray-white matter differentiation is preserved. Hypoattenuation within the  periventricular white matter is consistent with chronic microvascular ischemic changes, very mild for patient age.  Orbits are normal. Paranasal sinuses and mastoid air cells are clear  IMPRESSION: 1. No acute intracranial process. 2. Mild chronic microvascular ischemic disease.   Electronically Signed   By: [name] M.D.   On: 03/27/2013 03:37    Medications: Scheduled Meds: . aspirin  325 mg Oral Daily  . carvedilol  25 mg Oral BID WC  . ciprofloxacin  250 mg Oral BID  .  heparin  5,000 Units Subcutaneous Q8H  . levothyroxine  50 mcg Oral QAC breakfast  . sodium chloride  3 mL Intravenous Q12H      LOS: 0 days   RAI,RIPUDEEP M.D. [facility] 03/27/2013, 12:03 PM Pager: [phone]  If 7PM-7AM, please contact night-coverage [email or web address] Password [email or web address]

## 2013-03-27 NOTE — H&P (Signed)
[facility] History and Physical  Patient: Tiffany [name]  ZOX:[id number]  DOB: 15-Aug-1922  DOA: 03/27/2013  Referring physician: Dr Hyacinth Meeker PCP: Oliver Barre, MD  Consults:     Chief Complaint: fall  HPI: Tiffany [name] is a 79 y.o. female with Past medical history of nonischemic cardiomyopathy, complete heart block, history of ICD placement and recently CRT pacemaker placement, hypothyroidism, hypertension. She presented today with the complaint of dizziness and lightheadedness that has been ongoing on and off since last few weeks. She mentions that she feels lightheaded and that she is going to pass out. For the most severe episode was today when she was in the kitchen and was bending, she felt lightheadedness and fell on the ground. She back off for her head.  She denies any complaint of fever chills chest pain cough shortness of breath diarrhea nausea vomiting abdominal pain burning urination. There is no change in her medications. The last time she had similar episode was when she had pacemaker wire malfunction. She complains of headache but denies any focal neurological deficit.  Review of Systems: as mentioned in the history of present illness.  A Comprehensive review of the other systems is negative.  Past Medical History  Diagnosis Date  . Status post biventricular pacemaker     with AICD; 2004; revision 2007  . Complete heart block   . Nonischemic cardiomyopathy     EF 45-50% in 2009  . Systolic heart failure   . Macular degeneration   . Ventricular tachycardia   . Arthritis   . Glaucoma   . Hyperlipidemia   . Essential hypertension, benign   . Hypothyroidism   . Osteoporosis   . Allergy   . Impaired glucose tolerance 02/02/2011  . Vitamin D deficiency 02/02/2011  . Hearing loss 02/02/2011   Past Surgical History  Procedure Laterality Date  . Cardiac defibrillator placement    . Cholecystectomy    . Appendectomy    . Tonsillectomy    . Cataract surgery      Social History:  reports that she has never smoked. She has never used smokeless tobacco. She reports that she does not drink alcohol or use illicit drugs. Patient is coming from home. Independent for most of her  ADL.  No Known Allergies  Family History  Problem Relation Age of Onset  . Hypertension Mother   . Coronary artery disease Mother   . Arthritis Other   . Cancer Other     lung cancer  . Heart disease Other   . Hypertension Other     Prior to Admission medications   Medication Sig Start Date End Date Taking? Authorizing Provider  aspirin 325 MG EC tablet Take 325 mg by mouth daily.    Yes Historical Provider, MD  carvedilol (COREG) 25 MG tablet Take 1 tablet (25 mg total) by mouth 2 (two) times daily with a meal. 03/19/12  Yes Corwin Levins, MD  levothyroxine (SYNTHROID, LEVOTHROID) 50 MCG tablet Take 50 mcg by mouth daily before breakfast.   Yes Historical Provider, MD  telmisartan (MICARDIS) 80 MG tablet Take 80 mg by mouth daily.   Yes Historical Provider, MD  traMADol (ULTRAM) 50 MG tablet Take 50 mg by mouth every 6 (six) hours as needed for moderate pain.   Yes Historical Provider, MD  travoprost, benzalkonium, (TRAVATAN) 0.004 % ophthalmic solution Place 1 drop into both eyes at bedtime.     Yes Historical Provider, MD  Vitamin D, Ergocalciferol, (DRISDOL) 50000 UNITS  CAPS capsule Take 50,000 Units by mouth every 7 (seven) days. On Monday   Yes Historical Provider, MD    Physical Exam: Filed Vitals:   03/27/13 0252 03/27/13 0315 03/27/13 0551  BP: 179/92 162/83 154/74  Pulse: 71 67 79  Temp: 98.3 F (36.8 C)  98.9 F (37.2 C)  TempSrc: Oral  Oral  Resp: 22 18   Height:   5' 4.5" (1.638 m)  Weight:   75.705 kg (166 lb 14.4 oz)  SpO2: 97% 99% 99%    General: Alert, Awake and Oriented to Time, Place and Person. Appear in mild distress Eyes: PERRL ENT: Oral Mucosa clear dry. Neck: no JVD, no Carotid Bruits  Cardiovascular: S1 and S2 Present, no Murmur,  Peripheral Pulses Present Respiratory: Bilateral Air entry equal and Decreased, Clear to Auscultation,  no Crackles,no wheezes Abdomen: Bowel Sound Present, Soft and Non tender Skin: no Rash Extremities: no Pedal edema, no calf tenderness Neurologic: Grossly Unremarkable.  Labs on Admission:  CBC:  Recent Labs Lab 03/27/13 0250  WBC 7.4  NEUTROABS 3.1  HGB 13.2  HCT 36.8  MCV 98.7  PLT 156    CMP     Component Value Date/Time   NA 138 03/27/2013 0250   K 3.7 03/27/2013 0250   CL 102 03/27/2013 0250   CO2 26 03/27/2013 0250   GLUCOSE 108* 03/27/2013 0250   BUN 27* 03/27/2013 0250   CREATININE 1.30* 03/27/2013 0250   CALCIUM 10.2 03/27/2013 0250   PROT 6.5 03/27/2013 0250   ALBUMIN 3.2* 03/27/2013 0250   AST 22 03/27/2013 0250   ALT 14 03/27/2013 0250   ALKPHOS 66 03/27/2013 0250   BILITOT 0.4 03/27/2013 0250   GFRNONAA 36* 03/27/2013 0250   GFRAA 41* 03/27/2013 0250    No results found for this basename: LIPASE, AMYLASE,  in the last 168 hours No results found for this basename: AMMONIA,  in the last 168 hours  Cardiac Enzymes: No results found for this basename: CKTOTAL, CKMB, CKMBINDEX, TROPONINI,  in the last 168 hours  BNP (last 3 results) No results found for this basename: PROBNP,  in the last 8760 hours  Radiological Exams on Admission: Ct Head Wo Contrast  03/27/2013   CLINICAL DATA:  Dizziness, syncope  EXAM: CT HEAD WITHOUT CONTRAST  TECHNIQUE: Contiguous axial images were obtained from the base of the skull through the vertex without intravenous contrast.  COMPARISON:  Prior CT from 09/22/2003  FINDINGS: There is no acute intracranial hemorrhage or large vessel territory infarct. The CSF containing spaces are within normal limits. No extra-axial fluid collection. No mass lesion or midline shift. Gray-white matter differentiation is preserved. Hypoattenuation within the periventricular white matter is consistent with chronic microvascular ischemic changes, very mild for  patient age.  Orbits are normal. Paranasal sinuses and mastoid air cells are clear  IMPRESSION: 1. No acute intracranial process. 2. Mild chronic microvascular ischemic disease.   Electronically Signed   By: [name] M.D.   On: 03/27/2013 03:37    EKG: Independently reviewed. normal sinus rhythm, nonspecific ST and T waves changes.  Assessment/Plan Principal Problem:   Fall Active Problems:   Essential hypertension, benign   Hypothyroidism   Nonischemic cardiomyopathy   Biventricular cardiac pacemaker in situ   1. Fall The patient is presenting with fall with symptoms ongoing since last few weeks primarily dizziness and lightheadedness without vertigo. Her CT scan of the head, EKG, lab work does not show any significant abnormality. Her examination is also  unremarkable. At present we will monitor her on telemetry, consult physical therapy   for gait training. We should also discuss the case with cardiology in the morning as her pacemaker devices closing its expiration date. There was an attempted to get the pacemaker device downstairs in the ED but I do not see any report. Other possibility is dehydration as she has mild acute kidney injury. Recommended oral fluid intake at present.  2. Hypothyroidism Continue Synthroid  3. acute kidney injury Holding ARB at present  4. biventricular CRT Will need pacemaker interrogation results  DVT Prophylaxis: subcutaneous Heparin Nutrition:  Cardiac diet  Code Status:  DO NOT RESUSCITATE as per the discussion with the patient and son  Family Communication:  Son was present at bedside, opportunity was given to the family to ask question and all questions were answered satisfactorily at the time of interview. Disposition: Admitted to observation in telemetry.  Author: Lynden Oxford, MD [facility] Pager: [phone] 03/27/2013, 6:43 AM    If 7PM-7AM, please contact night-coverage [email or web address] Password [email or web address]

## 2013-03-27 NOTE — ED Notes (Signed)
PACEMAKER SERIAL NUMBER: [id number]  MODEL NUMBER: [id number]

## 2013-03-27 NOTE — ED Notes (Signed)
Thayer Ohm, RN at bedside interrogating pts pacemaker.

## 2013-03-27 NOTE — Evaluation (Signed)
Physical Therapy Evaluation Patient Details Name: Tiffany [name] MRN: [id number] DOB: November 06, 1922 Today's Date: 03/27/2013 Time: 2956-2130 PT Time Calculation (min): 15 min  PT Assessment / Plan / Recommendation History of Present Illness  Pateint is an 79 y/o female admitted with Past medical history of nonischemic cardiomyopathy, complete heart block, history of ICD placement and recently CRT pacemaker placement, hypothyroidism, hypertension.  She presented today with the complaint of dizziness and lightheadedness that has been ongoing on and off since last few weeks.  Clinical Impression  Patient presents with decreased independence with ambulation due to imbalance and will benefit from skilled PT in the acute setting to allow return home to independent.      PT Assessment  Patient needs continued PT services    Follow Up Recommendations  No PT follow up    Does the patient have the potential to tolerate intense rehabilitation    N/A  Barriers to Discharge  None      Equipment Recommendations  Other (comment) (TBA cane vs. none)    Recommendations for Other Services   None  Frequency Min 3X/week    Precautions / Restrictions Precautions Precautions: Fall   Pertinent Vitals/Pain No pain complaints      Mobility  Bed Mobility Bed Mobility: Supine to Sit;Sit to Supine Supine to Sit: 6: Modified independent (Device/Increase time) Sit to Supine: 6: Modified independent (Device/Increase time) Transfers Transfers: Sit to Stand;Stand to Sit Sit to Stand: 5: Supervision;From bed Stand to Sit: 5: Supervision;To bed Details for Transfer Assistance: for safety Ambulation/Gait Ambulation/Gait Assistance: 5: Supervision Ambulation Distance (Feet): 200 Feet Assistive device: None Ambulation/Gait Assistance Details: occasionally staggers to side due to c/o feeling "wobbly" due to sleepy and just woke up Gait Pattern: Step-through pattern Stairs: Yes Stairs Assistance: 5:  Supervision Stairs Assistance Details (indicate cue type and reason): for safety Stair Management Technique: One rail Right;Alternating pattern;Step to pattern Number of Stairs: 4        PT Diagnosis: Abnormality of gait  PT Problem List: Decreased balance PT Treatment Interventions: Balance training;Gait training;Stair training;Functional mobility training;Patient/family education;Therapeutic exercise;Therapeutic activities     PT Goals(Current goals can be found in the care plan section) Acute Rehab PT Goals Patient Stated Goal: To return home to independent PT Goal Formulation: With patient Time For Goal Achievement: 04/10/13 Potential to Achieve Goals: Good  Visit Information  Last PT Received On: 03/27/13 Assistance Needed: +1 History of Present Illness: Pateint is an 79 y/o female admitted with Past medical history of nonischemic cardiomyopathy, complete heart block, history of ICD placement and recently CRT pacemaker placement, hypothyroidism, hypertension.  She presented today with the complaint of dizziness and lightheadedness that has been ongoing on and off since last few weeks.       Prior Functioning  Home Living Family/patient expects to be discharged to:: Private residence Living Arrangements: Alone Type of Home: House Home Access: Stairs to enter [employer] of Steps: 1 in back door Entrance Stairs-Rails: None Home Layout: Multi-level Alternate Level Stairs-Number of Steps: 3 up to bedrooms and 7-9 down to lower level Alternate Level Stairs-Rails: Right Home Equipment: None Prior Function Level of Independence: Independent Communication Communication: No difficulties Dominant Hand: Right    Cognition  Cognition Arousal/Alertness: Awake/alert Behavior During Therapy: WFL for tasks assessed/performed Overall Cognitive Status: Within Functional Limits for tasks assessed    Extremity/Trunk Assessment Lower Extremity Assessment Lower Extremity  Assessment: Overall WFL for tasks assessed   Balance    End of Session PT -  End of Session Equipment Utilized During Treatment: Gait belt Activity Tolerance: Patient tolerated treatment well Patient left: in bed;with call bell/phone within reach  GP Functional Assessment Tool Used: Clinical Judgement Functional Limitation: Mobility: Walking and moving around Mobility: Walking and Moving Around Current Status (E4540): At least 1 percent but less than 20 percent impaired, limited or restricted Mobility: Walking and Moving Around Goal Status [phone]): 0 percent impaired, limited or restricted   [facility] 03/27/2013, 3:53 PM Sheran Lawless, PT [phone] 03/27/2013

## 2013-03-27 NOTE — ED Notes (Signed)
Pt has pacemaker/defibrillator.

## 2013-03-27 NOTE — ED Notes (Signed)
Dr. Miller at bedside to assess patient.

## 2013-03-27 NOTE — Consult Note (Signed)
ELECTROPHYSIOLOGY CONSULT NOTE    Patient ID: Tiffany [name] MRN: [id number], DOB/AGE: 1922/11/08 79 y.o.  Admit date: 03/27/2013 Date of Consult: 03-27-2013  Primary Physician: Oliver Barre, MD Primary Cardiologist: Lewayne Bunting, MD  Reason for Consultation: dizziness  HPI:  Tiffany [name] is a 79 year old female with a past medical history of complete heart block s/p CRTD in 2004 with revision in 2007 and downgrade to CRTP in 2012.  She is pacemaker dependent and is very sensitive to her rate lowering.  She has had progressive symptoms of dizziness over the last three days. These are sudden in onset and are relatively short lived.  She has not had frank syncope.  Interrogation of her device today demonstrates fracture of her 6949 lead (in LV port).  She has normal LV (4193) lead function. Ventricular sensitivity is set at 4mV.  No noise was reproducible today with isometrics.    Telemetry has demonstrated sinus rhythm with ventricular pacing.  CXR is pending.   EP has been asked to evaluate for treatment options.   Past Medical History  Diagnosis Date  . Status post biventricular pacemaker     with AICD; 2004; revision 2007  . Complete heart block   . Nonischemic cardiomyopathy     EF 45-50% in 2009  . Systolic heart failure   . Macular degeneration   . Ventricular tachycardia   . Arthritis   . Glaucoma   . Hyperlipidemia   . Essential hypertension, benign   . Hypothyroidism   . Osteoporosis   . Allergy   . Impaired glucose tolerance 02/02/2011  . Vitamin D deficiency 02/02/2011  . Hearing loss 02/02/2011     Surgical History:  Past Surgical History  Procedure Laterality Date  . Cardiac defibrillator placement    . Cholecystectomy    . Appendectomy    . Tonsillectomy    . Cataract surgery       Prescriptions prior to admission  Medication Sig Dispense Refill  . aspirin 325 MG EC tablet Take 325 mg by mouth daily.       . carvedilol (COREG) 25 MG tablet Take 1 tablet (25  mg total) by mouth 2 (two) times daily with a meal.  60 tablet  11  . levothyroxine (SYNTHROID, LEVOTHROID) 50 MCG tablet Take 50 mcg by mouth daily before breakfast.      . telmisartan (MICARDIS) 80 MG tablet Take 80 mg by mouth daily.      . traMADol (ULTRAM) 50 MG tablet Take 50 mg by mouth every 6 (six) hours as needed for moderate pain.      Marland Kitchen travoprost, benzalkonium, (TRAVATAN) 0.004 % ophthalmic solution Place 1 drop into both eyes at bedtime.        . Vitamin D, Ergocalciferol, (DRISDOL) 50000 UNITS CAPS capsule Take 50,000 Units by mouth every 7 (seven) days. On Monday        Inpatient Medications:  . aspirin  325 mg Oral Daily  . carvedilol  25 mg Oral BID WC  . ciprofloxacin  250 mg Oral BID  . heparin  5,000 Units Subcutaneous Q8H  . levothyroxine  50 mcg Oral QAC breakfast  . sodium chloride  3 mL Intravenous Q12H    Allergies: No Known Allergies  History   Social History  . Marital Status: Widowed    Spouse Name: N/A    Number of Children: N/A  . Years of Education: N/A   Occupational History  . retired  Social History Main Topics  . Smoking status: Never Smoker   . Smokeless tobacco: Never Used  . Alcohol Use: No  . Drug Use: No  . Sexual Activity: Not on file   Other Topics Concern  . Not on file   Social History Narrative  . No narrative on file     Family History  Problem Relation Age of Onset  . Hypertension Mother   . Coronary artery disease Mother   . Arthritis Other   . Cancer Other     lung cancer  . Heart disease Other   . Hypertension Other       Labs:   Lab Results  Component Value Date   WBC 7.4 03/27/2013   HGB 13.2 03/27/2013   HCT 36.8 03/27/2013   MCV 98.7 03/27/2013   PLT 156 03/27/2013    Recent Labs Lab 03/27/13 0250  NA 138  K 3.7  CL 102  CO2 26  BUN 27*  CREATININE 1.30*  CALCIUM 10.2  PROT 6.5  BILITOT 0.4  ALKPHOS 66  ALT 14  AST 22  GLUCOSE 108*   Lab Results  Component Value Date   TROPONINI  <0.30 03/27/2013    Radiology/Studies: Ct Head Wo Contrast  03/27/2013   CLINICAL DATA:  Dizziness, syncope  EXAM: CT HEAD WITHOUT CONTRAST  TECHNIQUE: Contiguous axial images were obtained from the base of the skull through the vertex without intravenous contrast.  COMPARISON:  Prior CT from 09/22/2003  FINDINGS: There is no acute intracranial hemorrhage or large vessel territory infarct. The CSF containing spaces are within normal limits. No extra-axial fluid collection. No mass lesion or midline shift. Gray-white matter differentiation is preserved. Hypoattenuation within the periventricular white matter is consistent with chronic microvascular ischemic changes, very mild for patient age.  Orbits are normal. Paranasal sinuses and mastoid air cells are clear  IMPRESSION: 1. No acute intracranial process. 2. Mild chronic microvascular ischemic disease.   Electronically Signed   By: [name] M.D.   On: 03/27/2013 03:37    ONG:EXBMW rhythm with ventricular pacing  TELEMETRY: sinus rhythm with ventricular pacing     A/P 1. Unexplained dizziness 2. CHB 3. S/P BiV PPM pacing LV only (RV lead is a 6949 and does not work) Rec: etiology of her spells is unclear. I suspect autonomic dysfunction. No evidence for any ventricular arrhythmias and she has underlying CHB. Would allow her blood pressure to run on the high side. Her pacing outputs are elevated and will need to be followed carefully. Transient elevation of her threshold and loss of capture could explain her spells. We have reprogrammed her device outputs up.  Tiffany Reeves.D.

## 2013-03-27 NOTE — Progress Notes (Signed)
Echocardiogram 2D Echocardiogram has been performed.  Tiffany [name] 03/27/2013, 1:01 PM

## 2013-03-27 NOTE — ED Provider Notes (Signed)
CSN: [id number]     Arrival date & time 03/27/13  0234 History   First MD Initiated Contact with Patient 03/27/13 0243     Chief Complaint  Patient presents with  . Near Syncope  . Dizziness   (Consider location/radiation/quality/duration/timing/severity/associated sxs/prior Treatment) HPI Comments: 79 year old female with a history of pacemaker, a nonischemic cardiomyopathy and a heart block. She presents because of a complaint of near syncope. She states this has happened several times over the last couple of weeks and states that it happens when she is standing, sometimes when she is sitting and sometimes when she is laying down. This is nonexertional or positional. This evening it happened when he bent over, felt as though she was going to blackout, fell to the ground and hit her head against an appliance. There was no loss of consciousness, no significant headache and no other complaints including chest pain shortness of breath weakness, numbness, visual changes, dysuria, diarrhea, rashes or swelling. This has happened several times, she has identical symptoms each time, she has not had these prior to the last 2 weeks. She states that her pacemaker was interrogated at her cardiologist's office in the last month and she was told it was working normally  The history is provided by the patient.    Past Medical History  Diagnosis Date  . Status post biventricular pacemaker     with AICD; 2004; revision 2007  . Complete heart block   . Nonischemic cardiomyopathy     EF 45-50% in 2009  . Systolic heart failure   . Macular degeneration   . Ventricular tachycardia   . Arthritis   . Glaucoma   . Hyperlipidemia   . Essential hypertension, benign   . Hypothyroidism   . Osteoporosis   . Allergy   . Impaired glucose tolerance 02/02/2011  . Vitamin D deficiency 02/02/2011  . Hearing loss 02/02/2011   Past Surgical History  Procedure Laterality Date  . Cardiac defibrillator placement    .  Cholecystectomy    . Appendectomy    . Tonsillectomy    . Cataract surgery     Family History  Problem Relation Age of Onset  . Hypertension Mother   . Coronary artery disease Mother   . Arthritis Other   . Cancer Other     lung cancer  . Heart disease Other   . Hypertension Other    History  Substance Use Topics  . Smoking status: Never Smoker   . Smokeless tobacco: Never Used  . Alcohol Use: No   OB History   Grav Para Term Preterm Abortions TAB SAB Ect Mult Living                 Review of Systems  All other systems reviewed and are negative.    Allergies  Review of patient's allergies indicates no known allergies.  Home Medications   Current Outpatient Rx  Name  Route  Sig  Dispense  Refill  . aspirin 325 MG EC tablet   Oral   Take 325 mg by mouth daily.          . carvedilol (COREG) 25 MG tablet   Oral   Take 1 tablet (25 mg total) by mouth 2 (two) times daily with a meal.   60 tablet   11   . levothyroxine (SYNTHROID, LEVOTHROID) 50 MCG tablet   Oral   Take 50 mcg by mouth daily before breakfast.         .  traMADol (ULTRAM) 50 MG tablet   Oral   Take 50 mg by mouth every 6 (six) hours as needed for moderate pain.         Marland Kitchen travoprost, benzalkonium, (TRAVATAN) 0.004 % ophthalmic solution   Both Eyes   Place 1 drop into both eyes at bedtime.           . Vitamin D, Ergocalciferol, (DRISDOL) 50000 UNITS CAPS capsule   Oral   Take 50,000 Units by mouth every 7 (seven) days. On Monday          BP 162/83  Pulse 67  Temp(Src) 98.3 F (36.8 C) (Oral)  Resp 18  SpO2 99% Physical Exam  Nursing note and vitals reviewed. Constitutional: She appears well-developed and well-nourished. No distress.  HENT:  Head: Normocephalic and atraumatic.  Mouth/Throat: Oropharynx is clear and moist. No oropharyngeal exudate.  Eyes: Conjunctivae and EOM are normal. Pupils are equal, round, and reactive to light. Right eye exhibits no discharge. Left eye  exhibits no discharge. No scleral icterus.  Neck: Normal range of motion. Neck supple. No JVD present. No thyromegaly present.  Cardiovascular: Normal rate, regular rhythm, normal heart sounds and intact distal pulses.  Exam reveals no gallop and no friction rub.   No murmur heard. Pulmonary/Chest: Effort normal and breath sounds normal. No respiratory distress. She has no wheezes. She has no rales.  Abdominal: Soft. Bowel sounds are normal. She exhibits no distension and no mass. There is no tenderness.  Musculoskeletal: Normal range of motion. She exhibits no edema and no tenderness.  Lymphadenopathy:    She has no cervical adenopathy.  Neurological: She is alert. Coordination normal.  Normal speech, normal coordination, normal strength in all 4 extremities and normal cranial nerves 3 through 12  Skin: Skin is warm and dry. No rash noted. No erythema.  Psychiatric: She has a normal mood and affect. Her behavior is normal.    ED Course  Procedures (including critical care time) Labs Review Labs Reviewed  CBC WITH DIFFERENTIAL - Abnormal; Notable for the following:    RBC 3.73 (*)    MCH 35.4 (*)    Neutrophils Relative % 41 (*)    Lymphocytes Relative 47 (*)    All other components within normal limits  COMPREHENSIVE METABOLIC PANEL - Abnormal; Notable for the following:    Glucose, Bld 108 (*)    BUN 27 (*)    Creatinine, Ser 1.30 (*)    Albumin 3.2 (*)    GFR calc non Af Amer 36 (*)    GFR calc Af Amer 41 (*)    All other components within normal limits  URINALYSIS, ROUTINE W REFLEX MICROSCOPIC  POCT I-STAT TROPONIN I   Imaging Review Ct Head Wo Contrast  03/27/2013   CLINICAL DATA:  Dizziness, syncope  EXAM: CT HEAD WITHOUT CONTRAST  TECHNIQUE: Contiguous axial images were obtained from the base of the skull through the vertex without intravenous contrast.  COMPARISON:  Prior CT from 09/22/2003  FINDINGS: There is no acute intracranial hemorrhage or large vessel territory  infarct. The CSF containing spaces are within normal limits. No extra-axial fluid collection. No mass lesion or midline shift. Gray-white matter differentiation is preserved. Hypoattenuation within the periventricular white matter is consistent with chronic microvascular ischemic changes, very mild for patient age.  Orbits are normal. Paranasal sinuses and mastoid air cells are clear  IMPRESSION: 1. No acute intracranial process. 2. Mild chronic microvascular ischemic disease.   Electronically Signed   By:  [name] M.D.   On: 03/27/2013 03:37    EKG Interpretation     Ventricular Rate:  72 PR Interval:  114 QRS Duration: 169 QT Interval:  480 QTC Calculation: 525 R Axis:   141 Text Interpretation:  Sinus rhythm Borderline short PR interval Consider left ventricular hypertrophy Prolonged QT interval paced rhythm No significant change since last tracing            MDM   1. Near syncope    The patient will need workup for this near syncope, she does have a mild headache at this time but states this did not happen before her episodes and has not been present for the last 2 weeks. She has no focal neurologic abnormalities, no auscultative cardiac abnormalities.  Pt with no acute finding on CT, trop or labs - she will need admission for frequent near syncope events that are not positional-   D/w the pacer interrogation folks - no signs of arrhythmia  D/w Dr. Allena Katz - will admit.  Vida Roller, MD 03/27/13 [phone]

## 2013-03-27 NOTE — Progress Notes (Signed)
UR COMPLETED  

## 2013-03-28 DIAGNOSIS — Z950 Presence of cardiac pacemaker: Secondary | ICD-10-CM

## 2013-03-28 DIAGNOSIS — R55 Syncope and collapse: Secondary | ICD-10-CM

## 2013-03-28 LAB — COMPREHENSIVE METABOLIC PANEL
AST: 22 U/L (ref 0–37)
Albumin: 3 g/dL — ABNORMAL LOW (ref 3.5–5.2)
BUN: 25 mg/dL — ABNORMAL HIGH (ref 6–23)
CO2: 24 mEq/L (ref 19–32)
Calcium: 9.3 mg/dL (ref 8.4–10.5)
Creatinine, Ser: 1.12 mg/dL — ABNORMAL HIGH (ref 0.50–1.10)
GFR calc non Af Amer: 43 mL/min — ABNORMAL LOW (ref >90)
Total Bilirubin: 0.6 mg/dL (ref 0.3–1.2)

## 2013-03-28 LAB — URINE CULTURE

## 2013-03-28 LAB — GLUCOSE, CAPILLARY

## 2013-03-28 LAB — CBC
Hemoglobin: 12.6 g/dL (ref 12.0–15.0)
MCH: 34.2 pg — ABNORMAL HIGH (ref 26.0–34.0)
MCV: 99.2 fL (ref 78.0–100.0)
Platelets: 149 10*3/uL — ABNORMAL LOW (ref 150–400)
RBC: 3.68 MIL/uL — ABNORMAL LOW (ref 3.87–5.11)
RDW: 12.8 % (ref 11.5–15.5)
WBC: 8.5 10*3/uL (ref 4.0–10.5)

## 2013-03-28 MED ORDER — SODIUM CHLORIDE 0.9 % IV BOLUS (SEPSIS)
500.0000 mL | Freq: Once | INTRAVENOUS | Status: AC
  Administered 2013-03-28: 500 mL via INTRAVENOUS

## 2013-03-28 NOTE — Progress Notes (Signed)
Physical Therapy Treatment Patient Details Name: Tiffany [name] MRN: [id number] DOB: [birth year] Today's Date: 03/28/2013 Time: 4098-1191 PT Time Calculation (min): 15 min  PT Assessment / Plan / Recommendation  History of Present Illness Pateint is an 79 y/o female admitted with Past medical history of nonischemic cardiomyopathy, complete heart block, history of ICD placement and recently CRT pacemaker placement, hypothyroidism, hypertension.  She presented today with the complaint of dizziness and lightheadedness that has been ongoing on and off since last few weeks.   PT Comments   I awoke pt from sleeping and re-took her orthostatic vitals which were again positive, despite the fact that pt felt asymptomatic (see readings below).  She continues to have a staggering gait pattern and would likely be safer with an assistive device (cane at least), but she is resistant to this idea stating that she doesn't go out much and she is ok at home and when she does go out she leans on the cart at the store.  She was too tired to do any balance testing today (we will try again next session), but I believe it would show that she should have an assistive device when she walks.  She would benefit from some home therapy for balance and gait training, especially if she is going to refuse to use an assistive device during gait.    Follow Up Recommendations  Home health PT     Does the patient have the potential to tolerate intense rehabilitation    NA  Barriers to Discharge   None      Equipment Recommendations  Other (comment);Cane (cane would be beneficial, but pt hesitant to use AD)    Recommendations for Other Services   None  Frequency Min 3X/week   Progress towards PT Goals Progress towards PT goals: Progressing toward goals  Plan Current plan remains appropriate    Precautions / Restrictions Precautions Precautions: Fall Precaution Comments: pt with staggering gait pattern   Pertinent Vitals/Pain  Orthostatic BP: Supine 119/46  Sitting 112/47  Standing 79/42      Mobility  Bed Mobility Bed Mobility: Supine to Sit;Sit to Supine;Sitting - Scoot to Edge of Bed Supine to Sit: 6: Modified independent (Device/Increase time);With rails;HOB elevated Sitting - Scoot to Edge of Bed: 6: Modified independent (Device/Increase time);With rail Sit to Supine: 6: Modified independent (Device/Increase time);With rail;HOB elevated Details for Bed Mobility Assistance: pt using bed rail to get to sitting.  Transfers Transfers: Sit to Stand;Stand to Sit Sit to Stand: 5: Supervision;With upper extremity assist;From bed Stand to Sit: 5: Supervision;With upper extremity assist;To bed Details for Transfer Assistance: Supervision for safety Ambulation/Gait Ambulation/Gait Assistance: 4: Min guard Ambulation Distance (Feet): 150 Feet Assistive device: None Ambulation/Gait Assistance Details: min guard assist for safety for occational LOB during gait, usually when turning corners, or turning her head while walking.   Gait Pattern: Step-through pattern (staggering) Gait velocity: decreased Stairs: Yes Stairs Assistance: 5: Supervision Stairs Assistance Details (indicate cue type and reason): supervision for safety, especially when going down the stairs she tends to lean her trunk a little far anteriorly.   Stair Management Technique: One rail Right;Step to pattern;Alternating pattern;Forwards (alternating up, step to down) Number of Stairs: 4      PT Goals (current goals can now be found in the care plan section) Acute Rehab PT Goals Patient Stated Goal: To return home to independent.  She does not want to use a cane or a RW.  PT Goal Formulation: With patient  Visit Information  Last PT Received On: 03/28/13 Assistance Needed: +1 History of Present Illness: Pateint is an 79 y/o female admitted with Past medical history of nonischemic cardiomyopathy, complete heart block, history of ICD placement  and recently CRT pacemaker placement, hypothyroidism, hypertension.  She presented today with the complaint of dizziness and lightheadedness that has been ongoing on and off since last few weeks.    Subjective Data  Subjective: Pt reports no symptoms when going from supine to sitting to standing.  Patient Stated Goal: To return home to independent.  She does not want to use a cane or a RW.    Cognition  Cognition Arousal/Alertness: Awake/alert Behavior During Therapy: WFL for tasks assessed/performed Overall Cognitive Status: Within Functional Limits for tasks assessed    Balance  Balance Balance Assessed: Yes Static Sitting Balance Static Sitting - Balance Support: No upper extremity supported;Feet supported Static Sitting - Level of Assistance: 7: Independent Static Standing Balance Static Standing - Balance Support: No upper extremity supported Static Standing - Level of Assistance: 5: Stand by assistance Dynamic Standing Balance Dynamic Standing - Balance Support: No upper extremity supported Dynamic Standing - Level of Assistance: 4: Min assist  End of Session PT - End of Session Activity Tolerance: Patient limited by fatigue Patient left: in bed;with call bell/phone within reach     [place] B. Kaamil Morefield, PT, DPT [phone]   03/28/2013, 3:06 PM

## 2013-03-28 NOTE — Progress Notes (Signed)
    Primary cardiologist: Dr. Lewayne Bunting  Subjective:    Patient with some weakness this morning, hypotensive at the time.  Objective:   Temp:  [97.7 F (36.5 C)-98.8 F (37.1 C)] 97.7 F (36.5 C) (11/08 0749) Pulse Rate:  [66-127] 110 (11/08 0759) Resp:  [16-18] 16 (11/08 0749) BP: (82-140)/(40-71) 82/46 mmHg (11/08 0759) SpO2:  [92 %-98 %] 98 % (11/08 0759) Weight:  [166 lb 12.8 oz (75.66 kg)] 166 lb 12.8 oz (75.66 kg) (11/08 0356) Last BM Date: 03/26/13  Filed Weights   03/27/13 0551 03/28/13 0356  Weight: 166 lb 14.4 oz (75.705 kg) 166 lb 12.8 oz (75.66 kg)    Intake/Output Summary (Last 24 hours) at 03/28/13 0957 Last data filed at 03/28/13 0456  Gross per 24 hour  Intake    600 ml  Output      0 ml  Net    600 ml    Telemetry: Paced rhythm.  Exam:  General: No distress.  Lungs: Clear, nonlabored.  Cardiac: Regular with ectopy.  Extremities: No pitting.   Lab Results:  Basic Metabolic Panel:  Recent Labs Lab 03/27/13 0250 03/28/13 0445  NA 138 139  K 3.7 4.3  CL 102 103  CO2 26 24  GLUCOSE 108* 94  BUN 27* 25*  CREATININE 1.30* 1.12*  CALCIUM 10.2 9.3    Liver Function Tests:  Recent Labs Lab 03/27/13 0250 03/28/13 0445  AST 22 22  ALT 14 11  ALKPHOS 66 61  BILITOT 0.4 0.6  PROT 6.5 6.1  ALBUMIN 3.2* 3.0*    CBC:  Recent Labs Lab 03/27/13 0250 03/28/13 0445  WBC 7.4 8.5  HGB 13.2 12.6  HCT 36.8 36.5  MCV 98.7 99.2  PLT 156 149*    Cardiac Enzymes:  Recent Labs Lab 03/27/13 0825 03/27/13 1430 03/27/13 2038  TROPONINI <0.30 <0.30 <0.30    Medications:   Scheduled Medications: . aspirin  325 mg Oral Daily  . ciprofloxacin  250 mg Oral BID  . heparin  5,000 Units Subcutaneous Q8H  . levothyroxine  50 mcg Oral QAC breakfast  . sodium chloride  500 mL Intravenous Once  . sodium chloride  3 mL Intravenous Q12H      PRN Medications:  traMADol   Assessment:   Dizziness, recent documentation of  hypotension. Patient has been seen by Dr. Ladona Ridgel and there is concern for possible autonomic dysfunction as an etiology. She has had no ventricular arrhythmias and has stable device function. She has underlying heart block status post BiV-PPM using LV pacing only (RV lead nonfunctional). Pacing has been stable. Coreg held this morning.   Plan/Discussion:    Agree with stopping Coreg for now. Patient getting fluid bolus. May want to consider eventually using a volume expander such as Florinef if this remains a recurring problem. Continue to follow telemetry.   Jonelle Sidle, M.D., F.A.C.C.

## 2013-03-28 NOTE — Progress Notes (Signed)
Patient ID: Tiffany [name]  female  EXB:[id number]    DOB: Sep 25, 1922    DOA: 03/27/2013  PCP: Oliver Barre, MD  Assessment/Plan: Principal Problem:   Near syncope/ recurrent episodes of dizziness in the last few weeks, worse in the last 3 days, pacemaker dependent. No syncopal episode,  has biventricular pacemaker, due to complete heart block. AICD in 2004, revision in 2007, also possibly due to autonomic dysfunction  -Troponins negative  - 2-D echo showed EF of 25-30%, grade 1 diastolic dysfunction,hypokinesis of the basal anterior and anteroseptal walls, akinesis of the basal anterolateral, basal and mid anterior walls and severe hypokinesis of the all the apical segments - Cardiology following, pacemaker Interrogation on 11/ 7 demonstrated fracture of her 6949 lead (in LV port). She has normal LV (4193) lead function. Per Dr. Gayla Doss elevation of her threshold and loss of capture could explain her spells. We have reprogrammed her device outputs up. - Patient still continued to have some orthostasis this morning, give fluid bolus, Coreg discontinued, per cardiology allow her blood pressure to run on higher side. Will recheck orthostatic vitals today and if she continues to have orthostasis, will add volume expander.   Active Problems: History of  Essential hypertension, benign: Currently BP mhypotensive, discontinued Coreg   Mild acute kidney injury possibly due to UTI, meds - Holding Micardis for now, treat UTI    Hypothyroidism - Continue Synthroid  UTI - urine culture only shows 20,000 colonies, on ciprofloxacin, day 2, will discontinue tomorrow   DVT Prophylaxis: heparin subcutaneous   Code Status:  Disposition:Hopefully tomorrow if improving    Subjective:  had a dizzy spell this morning, no chest pain or shortness of breath, fevers chills any nausea vomiting, tolerating oral diet Orthostatics positive   Objective: Weight change: -0.045 kg (-1.6 oz)  Intake/Output  Summary (Last 24 hours) at 03/28/13 1137 Last data filed at 03/28/13 0456  Gross per 24 hour  Intake    600 ml  Output      0 ml  Net    600 ml   Blood pressure 82/46, pulse 110, temperature 97.7 F (36.5 C), temperature source Oral, resp. rate 16, height 5' 4.5" (1.638 m), weight 75.66 kg (166 lb 12.8 oz), SpO2 98.00%.  Physical Exam: General: Ax O x3 not in any acute distress. CVS: S1-S2 clear Chest: CTAB Abdomen: soft, NT, ND, NBS  Extremities: no c/c/e bilaterally   Lab Results: Basic Metabolic Panel:  Recent Labs Lab 03/27/13 0250 03/28/13 0445  NA 138 139  K 3.7 4.3  CL 102 103  CO2 26 24  GLUCOSE 108* 94  BUN 27* 25*  CREATININE 1.30* 1.12*  CALCIUM 10.2 9.3   Liver Function Tests:  Recent Labs Lab 03/27/13 0250 03/28/13 0445  AST 22 22  ALT 14 11  ALKPHOS 66 61  BILITOT 0.4 0.6  PROT 6.5 6.1  ALBUMIN 3.2* 3.0*   No results found for this basename: LIPASE, AMYLASE,  in the last 168 hours No results found for this basename: AMMONIA,  in the last 168 hours CBC:  Recent Labs Lab 03/27/13 0250 03/28/13 0445  WBC 7.4 8.5  NEUTROABS 3.1  --   HGB 13.2 12.6  HCT 36.8 36.5  MCV 98.7 99.2  PLT 156 149*   Cardiac Enzymes:  Recent Labs Lab 03/27/13 0825 03/27/13 1430 03/27/13 2038  TROPONINI <0.30 <0.30 <0.30   BNP: No components found with this basename: POCBNP,  CBG:  Recent Labs Lab 03/27/13 0809 03/27/13 1216  03/27/13 1653 03/27/13 1949 03/28/13 0754  GLUCAP 102* 106* 105* 116* 88     Micro Results: Recent Results (from the past 240 hour(s))  URINE CULTURE     Status: None   Collection Time    03/27/13 10:49 AM      Result Value Range Status   Specimen Description URINE, CLEAN CATCH   Final   Special Requests NONE   Final   Culture  Setup Time     Final   Value: 03/27/2013 11:58     Performed at [facility] Count     Final   Value: 20,OOO COLONIES/ML     Performed at [facility]   Culture      Final   Value: Multiple bacterial morphotypes present, none predominant. Suggest appropriate recollection if clinically indicated.     Performed at [facility]   Report Status 03/28/2013 FINAL   Final    Studies/Results: Ct Head Wo Contrast  03/27/2013   CLINICAL DATA:  Dizziness, syncope  EXAM: CT HEAD WITHOUT CONTRAST  TECHNIQUE: Contiguous axial images were obtained from the base of the skull through the vertex without intravenous contrast.  COMPARISON:  Prior CT from 09/22/2003  FINDINGS: There is no acute intracranial hemorrhage or large vessel territory infarct. The CSF containing spaces are within normal limits. No extra-axial fluid collection. No mass lesion or midline shift. Gray-white matter differentiation is preserved. Hypoattenuation within the periventricular white matter is consistent with chronic microvascular ischemic changes, very mild for patient age.  Orbits are normal. Paranasal sinuses and mastoid air cells are clear  IMPRESSION: 1. No acute intracranial process. 2. Mild chronic microvascular ischemic disease.   Electronically Signed   By: [name] M.D.   On: 03/27/2013 03:37    Medications: Scheduled Meds: . aspirin  325 mg Oral Daily  . ciprofloxacin  250 mg Oral BID  . heparin  5,000 Units Subcutaneous Q8H  . levothyroxine  50 mcg Oral QAC breakfast  . sodium chloride  3 mL Intravenous Q12H      LOS: 1 day   [name] M.D. [facility] 03/28/2013, 11:37 AM Pager: [phone]  If 7PM-7AM, please contact night-coverage [email or web address] Password [email or web address]

## 2013-03-29 LAB — GLUCOSE, CAPILLARY: Glucose-Capillary: 140 mg/dL — ABNORMAL HIGH (ref 70–99)

## 2013-03-29 MED ORDER — ACETAMINOPHEN 325 MG PO TABS
650.0000 mg | ORAL_TABLET | ORAL | Status: DC | PRN
  Administered 2013-03-29 – 2013-03-30 (×2): 650 mg via ORAL
  Filled 2013-03-29 (×2): qty 2

## 2013-03-29 MED ORDER — TRAMADOL HCL 50 MG PO TABS
50.0000 mg | ORAL_TABLET | Freq: Four times a day (QID) | ORAL | Status: DC | PRN
  Administered 2013-03-29 – 2013-03-30 (×3): 100 mg via ORAL
  Filled 2013-03-29 (×3): qty 2

## 2013-03-29 MED ORDER — FLUDROCORTISONE ACETATE 0.1 MG PO TABS
0.1000 mg | ORAL_TABLET | Freq: Every day | ORAL | Status: DC
  Administered 2013-03-29 – 2013-03-30 (×2): 0.1 mg via ORAL
  Filled 2013-03-29 (×2): qty 1

## 2013-03-29 MED ORDER — FLUDROCORTISONE ACETATE 0.1 MG PO TABS
0.1000 mg | ORAL_TABLET | Freq: Every day | ORAL | Status: DC
  Filled 2013-03-29: qty 1

## 2013-03-29 NOTE — Progress Notes (Signed)
Utilization Review completed.  

## 2013-03-29 NOTE — Progress Notes (Signed)
    Primary cardiologist: Dr. Lewayne Bunting  Subjective:    Patient with some weakness this morning, hypotensive at the time.  Objective:   Temp:  [97.3 F (36.3 C)-98.1 F (36.7 C)] 98 F (36.7 C) (11/09 0821) Pulse Rate:  [60-97] 97 (11/09 0824) Resp:  [16-18] 18 (11/09 0530) BP: (79-149)/(42-60) 88/52 mmHg (11/09 0824) SpO2:  [94 %-97 %] 97 % (11/09 0821) Last BM Date: 03/27/13  Filed Weights   03/27/13 0551 03/28/13 0356  Weight: 166 lb 14.4 oz (75.705 kg) 166 lb 12.8 oz (75.66 kg)    Intake/Output Summary (Last 24 hours) at 03/29/13 0920 Last data filed at 03/29/13 0827  Gross per 24 hour  Intake    720 ml  Output      0 ml  Net    720 ml    Telemetry: Paced rhythm.  Exam:  General: No distress.  Lungs: Clear, nonlabored.  Cardiac: Regular with ectopy.  Extremities: No pitting.   Lab Results:  Basic Metabolic Panel:  Recent Labs Lab 03/27/13 0250 03/28/13 0445  NA 138 139  K 3.7 4.3  CL 102 103  CO2 26 24  GLUCOSE 108* 94  BUN 27* 25*  CREATININE 1.30* 1.12*  CALCIUM 10.2 9.3    Liver Function Tests:  Recent Labs Lab 03/27/13 0250 03/28/13 0445  AST 22 22  ALT 14 11  ALKPHOS 66 61  BILITOT 0.4 0.6  PROT 6.5 6.1  ALBUMIN 3.2* 3.0*    CBC:  Recent Labs Lab 03/27/13 0250 03/28/13 0445  WBC 7.4 8.5  HGB 13.2 12.6  HCT 36.8 36.5  MCV 98.7 99.2  PLT 156 149*    Cardiac Enzymes:  Recent Labs Lab 03/27/13 0825 03/27/13 1430 03/27/13 2038  TROPONINI <0.30 <0.30 <0.30    Medications:   Scheduled Medications: . aspirin  325 mg Oral Daily  . ciprofloxacin  250 mg Oral BID  . fludrocortisone  0.1 mg Oral Daily  . heparin  5,000 Units Subcutaneous Q8H  . levothyroxine  50 mcg Oral QAC breakfast  . sodium chloride  3 mL Intravenous Q12H     PRN Medications: acetaminophen, traMADol   Assessment:   Dizziness, recent documentation of hypotension. Patient has been seen by Dr. Ladona Ridgel and there is concern for possible  autonomic dysfunction as an etiology. She has had no ventricular arrhythmias and has stable device function. She has underlying heart block status post BiV-PPM using LV pacing only (RV lead nonfunctional). Pacing has been stable.   Coreg and Micardis have been held Florinef has been ordered this am.  Discussed with Dr. Isidoro Donning.  The plan is for her to be discharged tomorrow and follow up with [place] in the next week or so.   Vesta Mixer, Montez Hageman., MD, [facility] 03/29/2013, 9:28 AM Office - [phone] Pager [phone]

## 2013-03-29 NOTE — Progress Notes (Signed)
Patient ID: Tiffany [name]  female  ZOX:[id number]    DOB: 02-Aug-1922    DOA: 03/27/2013  PCP: Oliver Barre, MD  Assessment/Plan: Principal Problem:  Orthostatic hypotension/ Near syncope: recurrent episodes of dizziness in the last few weeks, worse in the last 3 days prior to admission, pacemaker dependent. No syncopal episode,  has biventricular pacemaker, due to complete heart block. AICD in 2004, revision in 2007, also possibly due to autonomic dysfunction  -Troponins remained negative  - 2-D echo showed EF of 25-30%, grade 1 diastolic dysfunction,hypokinesis of the basal anterior and anteroseptal walls, akinesis of the basal anterolateral, basal and mid anterior walls and severe hypokinesis of the all the apical segments - Cardiology following, pacemaker Interrogation on 11/ 7 demonstrated fracture of her 6949 lead (in LV port). She has normal LV (4193) lead function. Per Dr. Gayla Doss elevation of her threshold and loss of capture could explain her spells. We have reprogrammed her device outputs up. - Patient still continues to be orthostatic this morning, Florinef added, continue to hold antihypertensives  Active Problems: History of  Essential hypertension, benign - Orthostatic, hold beta blocker and Micardis   Mild acute kidney injury possibly due to UTI, meds - Holding Micardis for now, treat UTI    Hypothyroidism - Continue Synthroid  UTI - urine culture only shows 20,000 colonies, on ciprofloxacin, day 3, DC today   DVT Prophylaxis: heparin subcutaneous   Code Status:  Disposition:Hopefully tomorrow if improving, we'll arrange home health PT OT    Subjective: Orthostatic positive, BP in 80s on standing up  Objective: Weight change:   Intake/Output Summary (Last 24 hours) at 03/29/13 1106 Last data filed at 03/29/13 0827  Gross per 24 hour  Intake    720 ml  Output      0 ml  Net    720 ml   Blood pressure 88/52, pulse 97, temperature 98 F (36.7 C),  temperature source Oral, resp. rate 18, height 5' 4.5" (1.638 m), weight 75.66 kg (166 lb 12.8 oz), SpO2 97.00%.  Physical Exam: General: Ax O x3 not in any acute distress. CVS: S1-S2 clear Chest: CTAB Abdomen: soft, NT, ND, NBS  Extremities: no c/c/e bilaterally   Lab Results: Basic Metabolic Panel:  Recent Labs Lab 03/27/13 0250 03/28/13 0445  NA 138 139  K 3.7 4.3  CL 102 103  CO2 26 24  GLUCOSE 108* 94  BUN 27* 25*  CREATININE 1.30* 1.12*  CALCIUM 10.2 9.3   Liver Function Tests:  Recent Labs Lab 03/27/13 0250 03/28/13 0445  AST 22 22  ALT 14 11  ALKPHOS 66 61  BILITOT 0.4 0.6  PROT 6.5 6.1  ALBUMIN 3.2* 3.0*   No results found for this basename: LIPASE, AMYLASE,  in the last 168 hours No results found for this basename: AMMONIA,  in the last 168 hours CBC:  Recent Labs Lab 03/27/13 0250 03/28/13 0445  WBC 7.4 8.5  NEUTROABS 3.1  --   HGB 13.2 12.6  HCT 36.8 36.5  MCV 98.7 99.2  PLT 156 149*   Cardiac Enzymes:  Recent Labs Lab 03/27/13 0825 03/27/13 1430 03/27/13 2038  TROPONINI <0.30 <0.30 <0.30   BNP: No components found with this basename: POCBNP,  CBG:  Recent Labs Lab 03/27/13 1216 03/27/13 1653 03/27/13 1949 03/28/13 0754 03/28/13 1637  GLUCAP 106* 105* 116* 88 90     Micro Results: Recent Results (from the past 240 hour(s))  URINE CULTURE     Status: None  Collection Time    03/27/13 10:49 AM      Result Value Range Status   Specimen Description URINE, CLEAN CATCH   Final   Special Requests NONE   Final   Culture  Setup Time     Final   Value: 03/27/2013 11:58     Performed at [facility] Count     Final   Value: 20,OOO COLONIES/ML     Performed at [facility]   Culture     Final   Value: Multiple bacterial morphotypes present, none predominant. Suggest appropriate recollection if clinically indicated.     Performed at [facility]   Report Status 03/28/2013 FINAL   Final     Studies/Results: Ct Head Wo Contrast  03/27/2013   CLINICAL DATA:  Dizziness, syncope  EXAM: CT HEAD WITHOUT CONTRAST  TECHNIQUE: Contiguous axial images were obtained from the base of the skull through the vertex without intravenous contrast.  COMPARISON:  Prior CT from 09/22/2003  FINDINGS: There is no acute intracranial hemorrhage or large vessel territory infarct. The CSF containing spaces are within normal limits. No extra-axial fluid collection. No mass lesion or midline shift. Gray-white matter differentiation is preserved. Hypoattenuation within the periventricular white matter is consistent with chronic microvascular ischemic changes, very mild for patient age.  Orbits are normal. Paranasal sinuses and mastoid air cells are clear  IMPRESSION: 1. No acute intracranial process. 2. Mild chronic microvascular ischemic disease.   Electronically Signed   By: [name] M.D.   On: 03/27/2013 03:37    Medications: Scheduled Meds: . aspirin  325 mg Oral Daily  . ciprofloxacin  250 mg Oral BID  . fludrocortisone  0.1 mg Oral Daily  . heparin  5,000 Units Subcutaneous Q8H  . levothyroxine  50 mcg Oral QAC breakfast  . sodium chloride  3 mL Intravenous Q12H      LOS: 2 days   RAI,RIPUDEEP M.D. [facility] 03/29/2013, 11:06 AM Pager: [phone]  If 7PM-7AM, please contact night-coverage [email or web address] Password [email or web address]

## 2013-03-30 ENCOUNTER — Telehealth: Payer: Self-pay | Admitting: Internal Medicine

## 2013-03-30 ENCOUNTER — Encounter (HOSPITAL_COMMUNITY): Payer: Self-pay | Admitting: *Deleted

## 2013-03-30 DIAGNOSIS — W19XXXA Unspecified fall, initial encounter: Secondary | ICD-10-CM

## 2013-03-30 DIAGNOSIS — M129 Arthropathy, unspecified: Secondary | ICD-10-CM

## 2013-03-30 LAB — GLUCOSE, CAPILLARY: Glucose-Capillary: 88 mg/dL (ref 70–99)

## 2013-03-30 MED ORDER — FLUDROCORTISONE ACETATE 0.1 MG PO TABS: 0.1000 mg | ORAL_TABLET | Freq: Every day | ORAL | Status: DC

## 2013-03-30 NOTE — Progress Notes (Signed)
    Primary cardiologist: Dr. Lewayne Bunting  Subjective:    Patient continues to improve.  We have added Florinef and BP meds have been held.    Objective:   Temp:  [97.5 F (36.4 C)-98.5 F (36.9 C)] 98.5 F (36.9 C) (11/10 0511) Pulse Rate:  [69-97] 78 (11/10 0511) Resp:  [15-18] 15 (11/10 0511) BP: (88-136)/(43-60) 136/60 mmHg (11/10 0511) SpO2:  [95 %-97 %] 95 % (11/10 0511) Weight:  [168 lb (76.204 kg)] 168 lb (76.204 kg) (11/09 2020) Last BM Date: 03/26/13  Filed Weights   03/27/13 0551 03/28/13 0356 03/29/13 2020  Weight: 166 lb 14.4 oz (75.705 kg) 166 lb 12.8 oz (75.66 kg) 168 lb (76.204 kg)    Intake/Output Summary (Last 24 hours) at 03/30/13 0749 Last data filed at 03/29/13 1300  Gross per 24 hour  Intake    240 ml  Output      0 ml  Net    240 ml    Telemetry: Paced rhythm.  Exam:  General: No distress.  Lungs: Clear, nonlabored.  Cardiac: Regular with ectopy.  Extremities: No pitting.   Lab Results:  Basic Metabolic Panel:  Recent Labs Lab 03/27/13 0250 03/28/13 0445  NA 138 139  K 3.7 4.3  CL 102 103  CO2 26 24  GLUCOSE 108* 94  BUN 27* 25*  CREATININE 1.30* 1.12*  CALCIUM 10.2 9.3    Liver Function Tests:  Recent Labs Lab 03/27/13 0250 03/28/13 0445  AST 22 22  ALT 14 11  ALKPHOS 66 61  BILITOT 0.4 0.6  PROT 6.5 6.1  ALBUMIN 3.2* 3.0*    CBC:  Recent Labs Lab 03/27/13 0250 03/28/13 0445  WBC 7.4 8.5  HGB 13.2 12.6  HCT 36.8 36.5  MCV 98.7 99.2  PLT 156 149*    Cardiac Enzymes:  Recent Labs Lab 03/27/13 0825 03/27/13 1430 03/27/13 2038  TROPONINI <0.30 <0.30 <0.30    Medications:   Scheduled Medications: . aspirin  325 mg Oral Daily  . fludrocortisone  0.1 mg Oral Daily  . heparin  5,000 Units Subcutaneous Q8H  . levothyroxine  50 mcg Oral QAC breakfast  . sodium chloride  3 mL Intravenous Q12H     PRN Medications: acetaminophen, traMADol   Assessment:   Dizziness, recent documentation of  hypotension. Patient has been seen by Dr. Ladona Ridgel and there is concern for possible autonomic dysfunction as an etiology. She has had no ventricular arrhythmias and has stable device function. She has underlying heart block status post BiV-PPM using LV pacing only (RV lead nonfunctional). Pacing has been stable.   Coreg and Micardis have been held Florinef has been added yesterday  Discussed with Dr. Isidoro Donning. OK for DC.  We will see her in about a week  Alvia Grove., MD, [facility] 03/30/2013, 7:49 AM Office - [phone] Pager [phone]

## 2013-03-30 NOTE — Progress Notes (Signed)
Patient discharged home in stable and good  condition to self care. Transferred out via wheelchair; alert, oriented and conversant.

## 2013-03-30 NOTE — Discharge Summary (Signed)
Physician Discharge Summary  Patient ID: Tiffany [name] MRN: [id number] DOB/AGE: March 10, 1923 79 y.o.  Admit date: 03/27/2013 Discharge date: 03/30/2013  Primary Care Physician:  Oliver Barre, MD  Final Discharge Diagnoses:   Near-syncope likely secondary to Autonomic dysfunction   Secondary  Discharge diagnosis . Essential hypertension, benign . Nonischemic cardiomyopathy . Biventricular cardiac pacemaker in situ . Hypothyroidism . Near syncope . UTI (urinary tract infection)  Consults:  Neurology, Dr. Elease Hashimoto  Recommendations for Outpatient Follow-up:  Please note Coreg and Micardis has been held and patient was started on Flagyl. She has been tolerating it so far, please follow BP  Allergies:  No Known Allergies   Discharge Medications:   Medication List    STOP taking these medications       carvedilol 25 MG tablet  Commonly known as:  COREG     telmisartan 80 MG tablet  Commonly known as:  MICARDIS      TAKE these medications       aspirin 325 MG EC tablet  Take 325 mg by mouth daily.     fludrocortisone 0.1 MG tablet  Commonly known as:  FLORINEF  Take 1 tablet (0.1 mg total) by mouth daily.     levothyroxine 50 MCG tablet  Commonly known as:  SYNTHROID, LEVOTHROID  Take 50 mcg by mouth daily before breakfast.     traMADol 50 MG tablet  Commonly known as:  ULTRAM  Take 50 mg by mouth every 6 (six) hours as needed for moderate pain.     travoprost (benzalkonium) 0.004 % ophthalmic solution  Commonly known as:  TRAVATAN  Place 1 drop into both eyes at bedtime.     Vitamin D (Ergocalciferol) 50000 UNITS Caps capsule  Commonly known as:  DRISDOL  Take 50,000 Units by mouth every 7 (seven) days. On Monday         Brief H and P: For complete details please refer to admission H and P, but in brief patient is 79 year old female with history of nonischemic myopathy, complete heart block, history of ICD placement and recently CRT pacemaker placement,  hypothyroidism, hypertension.  She presented with the complaint of dizziness and lightheadedness that has been ongoing on and off since last few weeks. She mentioned that she felt lightheaded and that she is going to pass out. Most evere episode was today when she was in the kitchen and was bending, she felt lightheadedness and fell on the ground. She hit back of for her head.  She denied any complaint of fever chills chest pain cough shortness of breath diarrhea nausea vomiting abdominal pain burning urination. No reported change of medication. The last time she had similar episode was when she had pacemaker wire malfunction. She complained of headache but denies any focal neurological deficit.   Hospital Course:   Orthostatic hypotension/ Near syncope: recurrent episodes of dizziness in the last few weeks, worse in the last 3 days prior to admission, pacemaker dependent. No syncopal episode, has biventricular pacemaker, due to complete heart block. AICD in 2004, revision in 2007, also possibly due to autonomic dysfunction. Patient was admitted to telemetry floor, serial cardiac enzymes were obtained which remained negative. The patient underwent 2-D echocardiogram which showed EF of 25-30%, grade 1 diastolic dysfunction,hypokinesis of the basal anterior and anteroseptal walls, akinesis of the basal anterolateral, basal and mid anterior walls and severe hypokinesis of the all the apical segments Pacemaker interrogation was done on 03/27/13 which demonstrated fracture of her 6949 lead (in LV  port). She has normal LV (4193) lead function. Per Dr. Gayla Doss elevation of her threshold and loss of capture could explain her spells. We have reprogrammed her device outputs up.  However after reprogramming the pacemaker, patient still continued to be dizzy, orthostatic vitals were positive, patient was briefly given IV fluids with no significant improvement with orthostatic vitals, Florinef was added per  cardiology to medications. Coreg and Micardis were discontinued, she has been tolerating Florinef.   Mild acute kidney injury possibly due to UTI, meds: Micardis has been discontinued now.  UTI UA was slightly positive however urine culture only shows 20,000 colonies, patient received 3 days of ciprofloxacin after which it was discontinued.   Hypothyroidism - Continue Synthroid  Day of Discharge BP 136/60  Pulse 78  Temp(Src) 98.5 F (36.9 C) (Oral)  Resp 15  Ht 5\' 4"  (1.626 m)  Wt 76.204 kg (168 lb)  BMI 28.82 kg/m2  SpO2 95%  Physical Exam:  General: Ax O x3 not in any acute distress.  CVS: S1-S2 clear  Chest: CTAB  Abdomen: soft, NT, ND, NBS  Extremities: no c/c/e bilaterally   The results of significant diagnostics from this hospitalization (including imaging, microbiology, ancillary and laboratory) are listed below for reference.    LAB RESULTS: Basic Metabolic Panel:  Recent Labs Lab 03/27/13 0250 03/28/13 0445  NA 138 139  K 3.7 4.3  CL 102 103  CO2 26 24  GLUCOSE 108* 94  BUN 27* 25*  CREATININE 1.30* 1.12*  CALCIUM 10.2 9.3   Liver Function Tests:  Recent Labs Lab 03/27/13 0250 03/28/13 0445  AST 22 22  ALT 14 11  ALKPHOS 66 61  BILITOT 0.4 0.6  PROT 6.5 6.1  ALBUMIN 3.2* 3.0*   No results found for this basename: LIPASE, AMYLASE,  in the last 168 hours No results found for this basename: AMMONIA,  in the last 168 hours CBC:  Recent Labs Lab 03/27/13 0250 03/28/13 0445  WBC 7.4 8.5  NEUTROABS 3.1  --   HGB 13.2 12.6  HCT 36.8 36.5  MCV 98.7 99.2  PLT 156 149*   Cardiac Enzymes:  Recent Labs Lab 03/27/13 1430 03/27/13 2038  TROPONINI <0.30 <0.30   BNP: No components found with this basename: POCBNP,  CBG:  Recent Labs Lab 03/29/13 1934 03/30/13 0739  GLUCAP 140* 88    Significant Diagnostic Studies:  Ct Head Wo Contrast  03/27/2013   CLINICAL DATA:  Dizziness, syncope  EXAM: CT HEAD WITHOUT CONTRAST  TECHNIQUE:  Contiguous axial images were obtained from the base of the skull through the vertex without intravenous contrast.  COMPARISON:  Prior CT from 09/22/2003  FINDINGS: There is no acute intracranial hemorrhage or large vessel territory infarct. The CSF containing spaces are within normal limits. No extra-axial fluid collection. No mass lesion or midline shift. Gray-white matter differentiation is preserved. Hypoattenuation within the periventricular white matter is consistent with chronic microvascular ischemic changes, very mild for patient age.  Orbits are normal. Paranasal sinuses and mastoid air cells are clear  IMPRESSION: 1. No acute intracranial process. 2. Mild chronic microvascular ischemic disease.   Electronically Signed   By: [name] M.D.   On: 03/27/2013 03:37   Dg Chest Port 1 View  03/27/2013   CLINICAL DATA:  Pacemaker a she has.  EXAM: PORTABLE CHEST - 1 VIEW  COMPARISON:  Multiple priors  FINDINGS: AICD is in satisfactory position. The cardiomediastinal silhouette is unchanged. No edema. Left basilar opacity is noted. No  pleural effusion or pneumothorax. No acute osseous abnormality.  IMPRESSION: 1. Unchanged enlargement of the cardiac silhouette. 2. Left basilar opacity, likely atelectasis.   Electronically Signed   By: Jerene Dilling M.D.   On: 03/27/2013 13:02      Disposition and Follow-up:     Discharge Orders   Future Appointments Provider Department Dept Phone   06/05/2013 10:30 AM [facility] [phone]   Future Orders Complete By Expires   Diet - low sodium heart healthy  As directed    Increase activity slowly  As directed        DISPOSITION: home  DIET:  heart healthy diet    DISCHARGE FOLLOW-UP Follow-up Information   Follow up with Oliver Barre, MD. Schedule an appointment as soon as possible for a visit in 10 days.   Specialties:  Internal Medicine, Radiology   Contact information:   [address]  [place]omas[place] Bl[place] [address] [phone]       Follow up with Lewayne Bunting, MD. Schedule an appointment as soon as possible for a visit in 10 weeks. (please make appt for follow-up in 7-10days )    Specialty:  Cardiology   Contact information:   [address] [place] [address] [phone]       Time spent on Discharge: 35 minutes  Signed:   Sahirah Rudell M.D. [facility] 03/30/2013, 8:47 AM Pager: [phone]

## 2013-03-30 NOTE — Telephone Encounter (Addendum)
New Problem  Pt states that she was recommended to Dr. Oliver Barre by Dr. Ladona Ridgel and she is confused as to where she is supposed to go// Please assist.

## 2013-03-30 NOTE — Telephone Encounter (Signed)
She is not taking Synthroid and has not been on it for at least 2 years.  I have let her know she needs to see her PCP in follow up

## 2013-03-30 NOTE — Progress Notes (Signed)
Physical Therapy Treatment Patient Details Name: Tiffany [name] MRN: [id number] DOB: 06-09-1922 Today's Date: 03/30/2013 Time: 0900-0920 PT Time Calculation (min): 20 min  PT Assessment / Plan / Recommendation  History of Present Illness Pateint is an 79 y/o female admitted with Past medical history of nonischemic cardiomyopathy, complete heart block, history of ICD placement and recently CRT pacemaker placement, hypothyroidism, hypertension.  She presented today with the complaint of dizziness and lightheadedness that has been ongoing on and off since last few weeks.   PT Comments   Pt scored 35/56 on BERG balance test, indicating high risk for falls and that she would benefit from an AD to decrease fall risk at home.  Pt/PT discussed this and pt continues to state she will refuse to use AD.  Recommend HHPT to improve balance and decrease fall risk.  Follow Up Recommendations  Home health PT     Does the patient have the potential to tolerate intense rehabilitation     Barriers to Discharge        Equipment Recommendations  Other (comment);Cane    Recommendations for Other Services    Frequency Min 3X/week   Progress towards PT Goals Progress towards PT goals: Progressing toward goals  Plan Current plan remains appropriate    Precautions / Restrictions Precautions Precautions: Fall Restrictions Weight Bearing Restrictions: No   Pertinent Vitals/Pain No c/o pain    Mobility  Transfers Sit to Stand: 5: Supervision Stand to Sit: 5: Supervision Ambulation/Gait Ambulation/Gait Assistance: 5: Supervision Ambulation Distance (Feet): 50 Feet Assistive device: None Ambulation/Gait Assistance Details: pt with staggering gait, delayed balance reactions, holds onto furniture in room when walking    Exercises     PT Diagnosis:    PT Problem List:   PT Treatment Interventions:     PT Goals (current goals can now be found in the care plan section)    Visit Information  Last PT  Received On: 03/30/13 Assistance Needed: +1 History of Present Illness: Pateint is an 79 y/o female admitted with Past medical history of nonischemic cardiomyopathy, complete heart block, history of ICD placement and recently CRT pacemaker placement, hypothyroidism, hypertension.  She presented today with the complaint of dizziness and lightheadedness that has been ongoing on and off since last few weeks.    Subjective Data      Cognition  Cognition Arousal/Alertness: Awake/alert Behavior During Therapy: WFL for tasks assessed/performed Overall Cognitive Status: Within Functional Limits for tasks assessed    Balance  Standardized Balance Assessment Standardized Balance Assessment: Berg Balance Test Berg Balance Test Sit to Stand: Able to stand  independently using hands Standing Unsupported: Able to stand safely 2 minutes Sitting with Back Unsupported but Feet Supported on Floor or Stool: Able to sit safely and securely 2 minutes Stand to Sit: Controls descent by using hands Transfers: Able to transfer safely, definite need of hands Standing Unsupported with Eyes Closed: Able to stand 10 seconds with supervision Standing Ubsupported with Feet Together: Able to place feet together independently and stand for 1 minute with supervision From Standing, Reach Forward with Outstretched Arm: Can reach forward >12 cm safely (5") From Standing Position, Pick up Object from Floor: Able to pick up shoe, needs supervision From Standing Position, Turn to Look Behind Over each Shoulder: Turn sideways only but maintains balance Turn 360 Degrees: Able to turn 360 degrees safely but slowly Standing Unsupported, Alternately Place Feet on Step/Stool: Needs assistance to keep from falling or unable to try Standing Unsupported, One Foot in  Front: Able to take small step independently and hold 30 seconds Standing on One Leg: Unable to try or needs assist to prevent fall Total Score: 35  End of Session PT -  End of Session Equipment Utilized During Treatment: Gait belt Activity Tolerance: Patient limited by fatigue Patient left: in chair;with call bell/phone within reach   GP     [facility] 03/30/2013, 9:22 AM

## 2013-03-31 NOTE — Care Management (Signed)
1154 03-31-13 Case Manager had tried to contact pt on the day of d/c and was unable to do so. Pt had been d/c before orders for [facility] services came through [facility]. CM was able to contact pt today and she is at home alone. Cm expressed to pt that she may benefit from [facility] PT services due to balance issues. Pt states she really does not need any therapy. Pt is refusing HH services at this time- stating that she will be fine. No further needs from CM at this time. Gala Lewandowsky, RN,BSN [phone]

## 2013-04-01 ENCOUNTER — Telehealth: Payer: Self-pay

## 2013-04-01 NOTE — Telephone Encounter (Signed)
Received message from Dr.Nahser patient needs appointment with Norma Fredrickson NP or Dr.Jordan in 1 to 2 weeks.Patient already has post hospital appointment scheduled with Adella Hare PA 04/10/13.

## 2013-04-06 ENCOUNTER — Telehealth: Payer: Self-pay | Admitting: Internal Medicine

## 2013-04-06 NOTE — Telephone Encounter (Signed)
New message     Have hosp follow up appt with Tiffany [name] but now pt want Dr Ladona Ridgel only because her son is coming and have questions..  Dr Owens Shark next appt is dec and she do not want to wait until then----can you work her in sooner?   I did not cancel the appt with [facility] yet.

## 2013-04-06 NOTE — Telephone Encounter (Signed)
Took message back to Lupita Leash and she is going to call patient back and schedule with Dr Ladona Ridgel for the 19th as he has an opening

## 2013-04-08 ENCOUNTER — Other Ambulatory Visit: Payer: Self-pay | Admitting: Internal Medicine

## 2013-04-08 ENCOUNTER — Ambulatory Visit: Payer: Medicare Other | Admitting: Internal Medicine

## 2013-04-08 ENCOUNTER — Encounter: Payer: Medicare Other | Admitting: Internal Medicine

## 2013-04-10 ENCOUNTER — Encounter: Payer: Medicare Other | Admitting: Cardiology

## 2013-04-14 ENCOUNTER — Ambulatory Visit: Payer: Medicare Other | Admitting: Internal Medicine

## 2013-04-20 ENCOUNTER — Ambulatory Visit (INDEPENDENT_AMBULATORY_CARE_PROVIDER_SITE_OTHER): Payer: Medicare Other | Admitting: Internal Medicine

## 2013-04-20 ENCOUNTER — Encounter: Payer: Self-pay | Admitting: Internal Medicine

## 2013-04-20 VITALS — BP 180/86 | HR 66 | Ht 63.0 in | Wt 162.8 lb

## 2013-04-20 DIAGNOSIS — I10 Essential (primary) hypertension: Secondary | ICD-10-CM

## 2013-04-20 DIAGNOSIS — R42 Dizziness and giddiness: Secondary | ICD-10-CM

## 2013-04-20 DIAGNOSIS — R55 Syncope and collapse: Secondary | ICD-10-CM

## 2013-04-20 DIAGNOSIS — Z950 Presence of cardiac pacemaker: Secondary | ICD-10-CM

## 2013-04-20 NOTE — Progress Notes (Signed)
HPI Tiffany [name] returns today for followup. She is a very pleasant 80 year old woman with a history of a nonischemic cardiomyopathy, complete heart block, status post biventricular pacemaker insertion. Most recently, her right ventricular pacing portion of her defibrillator lead was not working, and she was pacing off of her left ventricular lead only. The patient is approximately one year from elective replacement. She gets quite symptomatic with pacemaker interrogation, and refused to allow [place] to interrogate her device today, stating that she had not taken her benzodiazepines and that it was too upsetting to her to undergo pacemaker interrogation. She had a dizzy episode several weeks ago but has had none since. This sounded like orthostasis. No Known Allergies   Current Outpatient Prescriptions  Medication Sig Dispense Refill  . aspirin 325 MG EC tablet Take 325 mg by mouth daily.       Marland Kitchen COLCRYS 0.6 MG tablet take 1 tablet by mouth every 12 hours if needed (NEED APPT FOR MORE REFILLS)  30 tablet  0  . fludrocortisone (FLORINEF) 0.1 MG tablet Take 1 tablet (0.1 mg total) by mouth daily.  30 tablet  3  . traMADol (ULTRAM) 50 MG tablet Take 50 mg by mouth every 6 (six) hours as needed for moderate pain.      Marland Kitchen travoprost, benzalkonium, (TRAVATAN) 0.004 % ophthalmic solution Place 1 drop into both eyes at bedtime.        . Vitamin D, Ergocalciferol, (DRISDOL) 50000 UNITS CAPS capsule Take 50,000 Units by mouth every 7 (seven) days. On Monday       No current facility-administered medications for this visit.     Past Medical History  Diagnosis Date  . Status post biventricular pacemaker     with AICD; 2004; revision 2007  . Complete heart block   . Nonischemic cardiomyopathy     EF 45-50% in 2009  . Systolic heart failure   . Macular degeneration   . Ventricular tachycardia   . Arthritis   . Glaucoma   . Hyperlipidemia   . Essential hypertension, benign   . Hypothyroidism   .  Osteoporosis   . Allergy   . Impaired glucose tolerance 02/02/2011  . Vitamin D deficiency 02/02/2011  . Hearing loss 02/02/2011    ROS:   All systems reviewed and negative except as noted in the HPI.   Past Surgical History  Procedure Laterality Date  . Cardiac defibrillator placement    . Cholecystectomy    . Appendectomy    . Tonsillectomy    . Cataract surgery       Family History  Problem Relation Age of Onset  . Hypertension Mother   . Coronary artery disease Mother   . Arthritis Other   . Cancer Other     lung cancer  . Heart disease Other   . Hypertension Other      History   Social History  . Marital Status: Widowed    Spouse Name: N/A    Number of Children: N/A  . Years of Education: N/A   Occupational History  . retired    Social History Main Topics  . Smoking status: Never Smoker   . Smokeless tobacco: Never Used  . Alcohol Use: No  . Drug Use: No  . Sexual Activity: Not on file   Other Topics Concern  . Not on file   Social History Narrative  . No narrative on file     BP 180/86  Pulse 66  Ht 5\' 3"  (1.6 m)  Wt 162 lb 12.8 oz (73.846 kg)  BMI 28.85 kg/m2  Physical Exam:  Chronically ill appearing NAD HEENT: Unremarkable Neck:  No JVD, no thyromegally Back:  No CVA tenderness Lungs:  Clear with no wheezes, rales, or rhonchi. HEART:  Regular rate rhythm, no murmurs, no rubs, no clicks Abd:  soft, positive bowel sounds, no organomegally, no rebound, no guarding Ext:  2 plus pulses, no edema, no cyanosis, no clubbing Skin:  No rashes no nodules Neuro:  CN II through XII intact, motor grossly intact   DEVICE  - patient refused interrogation  Assess/Plan:

## 2013-04-20 NOTE — Assessment & Plan Note (Signed)
Her blood pressure has been quite labile. Recently it was low, and she was placed on Florinef. Her antihypertensive medications were discontinued. Today her blood pressure is elevated. She states that she has been nervous about coming to the doctor's office. She states that at home her blood pressure is much better. For this reason I will make no adjustment in her medications today.

## 2013-04-20 NOTE — Patient Instructions (Addendum)
Your physician recommends that you continue on your current medications as directed. Please refer to the Current Medication list given to you today.  Your physician recommends that you schedule a follow-up appointment in: February with Dr. Ladona Ridgel.   Remote monitoring is used to monitor your Pacemaker of ICD from home. This monitoring reduces the number of office visits required to check your device to one time per year. It allows [place] to keep an eye on the functioning of your device to ensure it is working properly. You are scheduled for a device check from home on 07/22/2013. You may send your transmission at any time that day. If you have a wireless device, the transmission will be sent automatically. After your physician reviews your transmission, you will receive a postcard with your next transmission date.

## 2013-04-20 NOTE — Assessment & Plan Note (Signed)
The patient appears to be stable, although she did not allow [place] to interrogate her device today. I will see her back in approximately 3 months. Based on the results of her interrogation at that time, we will make additional plans for device revision.

## 2013-04-22 ENCOUNTER — Telehealth: Payer: Self-pay | Admitting: Internal Medicine

## 2013-04-22 NOTE — Telephone Encounter (Signed)
New Problem:  Pt is calling in with concerns about her pacemaker. Pt states she was told at her last appt that her pacemaker needs to be replaced and she was scheduled for a Febuary appt. Pt is stating she thinks Rayetta Pigg is too far out for her pacemaker to be replaced. Pt would like the nurse to call her back.

## 2013-04-22 NOTE — Telephone Encounter (Signed)
Patient was concerned that she was only pacing with "one wire" since her hospital device check. I informed her that per Dr.Taylor's note her device lost RV capture and had only been pacing from the LV. I told her that her output was increased and should now be working appropriately. I advised patient to call the office if she feels any of the symptoms that she felt when she went to the ED last month. Patient voiced understanding and was fine with the information given.

## 2013-05-01 ENCOUNTER — Telehealth: Payer: Self-pay

## 2013-05-01 MED ORDER — CIPROFLOXACIN HCL 250 MG PO TABS: 250.0000 mg | ORAL_TABLET | Freq: Two times a day (BID) | ORAL | Status: DC

## 2013-05-01 NOTE — Telephone Encounter (Signed)
Patient called with symptoms of a bladder infection.  She is taking OTC but not helping, urgency, burning and pressure are her symptoms.She did inform she was diagnosed with a bladder infection in the hospital and given nothing for it, told to followup with PCP, but she has waited too long.  She is scheduled to see Dr. Jonny Ruiz 05/08/13  Advise as she would like something called in.

## 2013-05-01 NOTE — Telephone Encounter (Signed)
Ok this time only for antibx given her age and clinical situation; consider Sat clinic tomorrow if having worsening symtpoms

## 2013-05-01 NOTE — Telephone Encounter (Signed)
Patient informed. 

## 2013-05-08 ENCOUNTER — Other Ambulatory Visit: Payer: Self-pay | Admitting: Internal Medicine

## 2013-05-08 ENCOUNTER — Ambulatory Visit: Payer: Medicare Other | Admitting: Internal Medicine

## 2013-05-15 ENCOUNTER — Ambulatory Visit (INDEPENDENT_AMBULATORY_CARE_PROVIDER_SITE_OTHER): Payer: Medicare Other | Admitting: Internal Medicine

## 2013-05-15 ENCOUNTER — Encounter: Payer: Self-pay | Admitting: Internal Medicine

## 2013-05-15 VITALS — BP 162/90 | HR 101 | Temp 97.4°F | Ht 64.5 in | Wt 163.5 lb

## 2013-05-15 DIAGNOSIS — R7302 Impaired glucose tolerance (oral): Secondary | ICD-10-CM

## 2013-05-15 DIAGNOSIS — I10 Essential (primary) hypertension: Secondary | ICD-10-CM

## 2013-05-15 DIAGNOSIS — E039 Hypothyroidism, unspecified: Secondary | ICD-10-CM

## 2013-05-15 DIAGNOSIS — E785 Hyperlipidemia, unspecified: Secondary | ICD-10-CM

## 2013-05-15 DIAGNOSIS — R7309 Other abnormal glucose: Secondary | ICD-10-CM

## 2013-05-15 NOTE — Assessment & Plan Note (Signed)
stable overall by history and exam, recent data reviewed with pt, and pt to continue medical treatment as before,  to f/u any worsening symptoms or concerns Lab Results  Component Value Date   LDLCALC 129* 03/27/2013

## 2013-05-15 NOTE — Assessment & Plan Note (Signed)
stable overall by history and exam, recent data reviewed with pt, and pt to continue medical treatment as before,  to f/u any worsening symptoms or concerns Lab Results  Component Value Date   TSH 6.432* 03/27/2013

## 2013-05-15 NOTE — Progress Notes (Signed)
Pre-visit discussion using our clinic review tool. No additional management support is needed unless otherwise documented below in the visit note.  

## 2013-05-15 NOTE — Assessment & Plan Note (Signed)
BP now elevated off previous BP meds and on florinef, asympt, andstable overall by history and exam, recent data reviewed with pt, and pt declines change in tx at this time,  to f/u any worsening symptoms or concerns, plans to f/u with cardiology as planned BP Readings from Last 3 Encounters:  05/15/13 162/90  04/20/13 180/86  03/30/13 136/60

## 2013-05-15 NOTE — Patient Instructions (Signed)
Please continue all other medications as before, and refills have been done if requested. Please have the pharmacy call with any other refills you may need.  Please keep your appointments with your specialists as you have planned  Please remember to sign up for My Chart if you have not done so, as this will be important to you in the future with finding out test results, communicating by private email, and scheduling acute appointments online when needed.

## 2013-05-15 NOTE — Assessment & Plan Note (Signed)
stable overall by history and exam, recent data reviewed with pt, and pt to continue medical treatment as before,  to f/u any worsening symptoms or concerns Lab Results  Component Value Date   HGBA1C 6.1 03/19/2012

## 2013-05-15 NOTE — Progress Notes (Signed)
Subjective:    Patient ID: Tiffany [name], female    DOB: December 01, 1922, 79 y.o.   MRN: [id number]  HPI  Here to f/u with son, severe HOH, but overall doing ok. Pt denies chest pain, increased sob or doe, wheezing, orthopnea, PND, increased LE swelling, palpitations, dizziness or syncope.  Pt denies new neurological symptoms such as new headache, or facial or extremity weakness or numbness  No further "spells" after recent hospn.   Pt denies polydipsia, polyuria.   Pt denies fever, wt loss, night sweats, loss of appetite, or other constitutional symptoms  Denies urinary symptoms such as dysuria, frequency, urgency, flank pain, hematuria or n/v, fever, chills.  No other acute complaints Past Medical History  Diagnosis Date  . Status post biventricular pacemaker     with AICD; 2004; revision 2007  . Complete heart block   . Nonischemic cardiomyopathy     EF 45-50% in 2009  . Systolic heart failure   . Macular degeneration   . Ventricular tachycardia   . Arthritis   . Glaucoma   . Hyperlipidemia   . Essential hypertension, benign   . Hypothyroidism   . Osteoporosis   . Allergy   . Impaired glucose tolerance 02/02/2011  . Vitamin D deficiency 02/02/2011  . Hearing loss 02/02/2011   Past Surgical History  Procedure Laterality Date  . Cardiac defibrillator placement    . Cholecystectomy    . Appendectomy    . Tonsillectomy    . Cataract surgery      reports that she has never smoked. She has never used smokeless tobacco. She reports that she does not drink alcohol or use illicit drugs. family history includes Arthritis in her other; Cancer in her other; Coronary artery disease in her mother; Heart disease in her other; Hypertension in her mother and other. No Known Allergies Current Outpatient Prescriptions on File Prior to Visit  Medication Sig Dispense Refill  . aspirin 325 MG EC tablet Take 325 mg by mouth daily.       Marland Kitchen COLCRYS 0.6 MG tablet take 1 tablet by mouth every 12 hours if  needed (NEED APPT FOR MORE REFILLS)  30 tablet  0  . fludrocortisone (FLORINEF) 0.1 MG tablet Take 1 tablet (0.1 mg total) by mouth daily.  30 tablet  3  . traMADol (ULTRAM) 50 MG tablet Take 50 mg by mouth every 6 (six) hours as needed for moderate pain.      Marland Kitchen travoprost, benzalkonium, (TRAVATAN) 0.004 % ophthalmic solution Place 1 drop into both eyes at bedtime.        . Vitamin D, Ergocalciferol, (DRISDOL) 50000 UNITS CAPS capsule Take 50,000 Units by mouth every 7 (seven) days. On Monday       No current facility-administered medications on file prior to visit.   Review of Systems  Constitutional: Negative for unexpected weight change, or unusual diaphoresis  HENT: Negative for tinnitus.   Eyes: Negative for photophobia and visual disturbance.  Respiratory: Negative for choking and stridor.   Gastrointestinal: Negative for vomiting and blood in stool.  Genitourinary: Negative for hematuria and decreased urine volume.  Musculoskeletal: Negative for acute joint swelling Skin: Negative for color change and wound.  Neurological: Negative for tremors and numbness other than noted  Psychiatric/Behavioral: Negative for decreased concentration or  hyperactivity.       Objective:   Physical Exam BP 162/90  Pulse 101  Temp(Src) 97.4 F (36.3 C) (Oral)  Ht 5' 4.5" (1.638 m)  Wt  163 lb 8 oz (74.163 kg)  BMI 27.64 kg/m2  SpO2 94% VS noted,  Constitutional: Pt appears well-developed and well-nourished.  HENT: Head: NCAT.  Right Ear: External ear normal.  Left Ear: External ear normal.  Eyes: Conjunctivae and EOM are normal. Pupils are equal, round, and reactive to light.  Neck: Normal range of motion. Neck supple.  Cardiovascular: Normal rate and regular rhythm.   Pulmonary/Chest: Effort normal and breath sounds normal.  Abd:  Soft, NT, non-distended, + BS Neurological: Pt is alert. Not confused  Skin: Skin is warm. No erythema.  Psychiatric: Pt behavior is normal. Thought content  normal.         Assessment & Plan:

## 2013-05-18 ENCOUNTER — Other Ambulatory Visit: Payer: Self-pay | Admitting: Internal Medicine

## 2013-05-18 NOTE — Telephone Encounter (Signed)
Ok to refill? Last OV 12.26.14

## 2013-05-18 NOTE — Telephone Encounter (Signed)
Done hardcopy to robin  

## 2013-05-19 NOTE — Telephone Encounter (Signed)
Faxed hardcopy to [facility] [address] [place] 

## 2013-05-20 ENCOUNTER — Other Ambulatory Visit: Payer: Self-pay

## 2013-05-20 MED ORDER — VITAMIN D (ERGOCALCIFEROL) 1.25 MG (50000 UNIT) PO CAPS: 50000.0000 [IU] | ORAL_CAPSULE | ORAL | Status: DC

## 2013-05-25 ENCOUNTER — Telehealth: Payer: Self-pay | Admitting: Internal Medicine

## 2013-05-25 NOTE — Telephone Encounter (Signed)
New message    Is she supposed to be taking synthroid?  It was mentioned on her discharge papers from a couple of months ago.

## 2013-05-25 NOTE — Telephone Encounter (Signed)
Spopke with patient and let her know the Synthroid was not on her medication list at her last offive visit with Dr Ladona Ridgel 03/05/13 but it was on her H&P from her hospital admission 03/27/13.   She says she was taking at one time but can not remember if and why she stopped.  Dr Jonny Ruiz follows her for her thyroid and she says it was not mentioned at her most recent office visit.  I have asked her to call his office and ask if he feels she should be still taking the medication.  She did have a TSH drawn in the hospital --it was 6.432.  She is going to contact Dr Raphael Gibney office

## 2013-05-27 ENCOUNTER — Telehealth: Payer: Self-pay

## 2013-05-27 NOTE — Telephone Encounter (Signed)
The patient is hoping to get her no show fee removed from missing an apt in November.  She states the apt was rescheduled to a later date.  She did come in for the hospital fu apt in December.    (let me know if I need to forward this to another person)   Thanks!

## 2013-06-01 ENCOUNTER — Telehealth: Payer: Self-pay | Admitting: *Deleted

## 2013-06-01 NOTE — Telephone Encounter (Signed)
Pt states that she thought/isn't clear if she needs to be taking thyroid medication.  According to [facility], it is not on her med list.  Please advise if left off erroneously.   CB# [phone]

## 2013-06-01 NOTE — Telephone Encounter (Signed)
No, ok to hold off for now, will plan to re-check next visit

## 2013-06-01 NOTE — Telephone Encounter (Signed)
Emailed Psychologist, occupational, no-show fee removed

## 2013-06-02 NOTE — Telephone Encounter (Signed)
Notified patient of MD response.  She verbalized understanding & appreciation. 

## 2013-06-05 ENCOUNTER — Ambulatory Visit (INDEPENDENT_AMBULATORY_CARE_PROVIDER_SITE_OTHER): Payer: Medicare Other | Admitting: *Deleted

## 2013-06-05 DIAGNOSIS — I5020 Unspecified systolic (congestive) heart failure: Secondary | ICD-10-CM

## 2013-06-05 DIAGNOSIS — I472 Ventricular tachycardia, unspecified: Secondary | ICD-10-CM

## 2013-06-05 DIAGNOSIS — I4729 Other ventricular tachycardia: Secondary | ICD-10-CM

## 2013-06-05 DIAGNOSIS — I442 Atrioventricular block, complete: Secondary | ICD-10-CM

## 2013-06-05 DIAGNOSIS — I428 Other cardiomyopathies: Secondary | ICD-10-CM

## 2013-06-05 LAB — MDC_IDC_ENUM_SESS_TYPE_REMOTE
Brady Statistic AP VP Percent: 34.03 %
Brady Statistic AP VS Percent: 0.01 %
Brady Statistic AS VP Percent: 65.88 %
Brady Statistic AS VS Percent: 0.09 %
Brady Statistic RA Percent Paced: 34.04 %
Brady Statistic RV Percent Paced: 99.91 %
Date Time Interrogation Session: 20150116213612
Lead Channel Impedance Value: 361 Ohm
Lead Channel Impedance Value: 4047 Ohm
Lead Channel Impedance Value: 4047 Ohm
Lead Channel Impedance Value: 4047 Ohm
Lead Channel Impedance Value: 4047 Ohm
Lead Channel Impedance Value: 4047 Ohm
Lead Channel Impedance Value: 456 Ohm
Lead Channel Impedance Value: 532 Ohm
Lead Channel Impedance Value: 532 Ohm
Lead Channel Pacing Threshold Amplitude: 4.25 V
Lead Channel Sensing Intrinsic Amplitude: 11.625 mV
Lead Channel Setting Pacing Amplitude: 3 V
Lead Channel Setting Pacing Amplitude: 6 V
Lead Channel Setting Pacing Amplitude: 6 V
Lead Channel Setting Pacing Pulse Width: 0.8 ms
Lead Channel Setting Pacing Pulse Width: 0.8 ms
MDC IDC MSMT BATTERY REMAINING LONGEVITY: 9 mo
MDC IDC MSMT BATTERY VOLTAGE: 2.88 V
MDC IDC MSMT LEADCHNL LV PACING THRESHOLD PULSEWIDTH: 0.8 ms
MDC IDC MSMT LEADCHNL RA SENSING INTR AMPL: 0.375 mV
MDC IDC MSMT LEADCHNL RA SENSING INTR AMPL: 0.375 mV
MDC IDC MSMT LEADCHNL RV SENSING INTR AMPL: 11.625 mV
MDC IDC SET LEADCHNL RV SENSING SENSITIVITY: 4 mV
Zone Setting Detection Interval: 350 ms
Zone Setting Detection Interval: 400 ms

## 2013-06-16 ENCOUNTER — Encounter: Payer: Self-pay | Admitting: *Deleted

## 2013-06-23 ENCOUNTER — Encounter: Payer: Self-pay | Admitting: Internal Medicine

## 2013-06-23 ENCOUNTER — Ambulatory Visit (INDEPENDENT_AMBULATORY_CARE_PROVIDER_SITE_OTHER): Payer: Medicare Other | Admitting: Internal Medicine

## 2013-06-23 VITALS — BP 140/82 | Ht 64.5 in | Wt 157.0 lb

## 2013-06-23 DIAGNOSIS — Z950 Presence of cardiac pacemaker: Secondary | ICD-10-CM

## 2013-06-23 DIAGNOSIS — I442 Atrioventricular block, complete: Secondary | ICD-10-CM

## 2013-06-23 DIAGNOSIS — I428 Other cardiomyopathies: Secondary | ICD-10-CM

## 2013-06-23 DIAGNOSIS — I5020 Unspecified systolic (congestive) heart failure: Secondary | ICD-10-CM

## 2013-06-23 LAB — MDC_IDC_ENUM_SESS_TYPE_INCLINIC
Battery Remaining Longevity: 8 mo
Battery Voltage: 2.87 V
Brady Statistic AP VS Percent: 0.01 %
Brady Statistic AS VP Percent: 66.79 %
Brady Statistic RA Percent Paced: 33.1 %
Brady Statistic RV Percent Paced: 99.88 %
Lead Channel Impedance Value: 380 Ohm
Lead Channel Impedance Value: 399 Ohm
Lead Channel Impedance Value: 4047 Ohm
Lead Channel Impedance Value: 4047 Ohm
Lead Channel Impedance Value: 4047 Ohm
Lead Channel Impedance Value: 4047 Ohm
Lead Channel Impedance Value: 475 Ohm
Lead Channel Pacing Threshold Amplitude: 1.5 V
Lead Channel Pacing Threshold Amplitude: 4.25 V
Lead Channel Pacing Threshold Pulse Width: 0.8 ms
Lead Channel Pacing Threshold Pulse Width: 0.8 ms
Lead Channel Sensing Intrinsic Amplitude: 0.5 mV
Lead Channel Sensing Intrinsic Amplitude: 7.5 mV
Lead Channel Setting Pacing Amplitude: 3 V
Lead Channel Setting Pacing Amplitude: 6 V
Lead Channel Setting Pacing Amplitude: 6 V
Lead Channel Setting Pacing Pulse Width: 0.8 ms
Lead Channel Setting Pacing Pulse Width: 0.8 ms
Lead Channel Setting Sensing Sensitivity: 4 mV
MDC IDC MSMT LEADCHNL LV IMPEDANCE VALUE: 4047 Ohm
MDC IDC MSMT LEADCHNL RA SENSING INTR AMPL: 0.5 mV
MDC IDC MSMT LEADCHNL RV IMPEDANCE VALUE: 494 Ohm
MDC IDC MSMT LEADCHNL RV SENSING INTR AMPL: 6.875 mV
MDC IDC SESS DTM: 20150203163652
MDC IDC SET ZONE DETECTION INTERVAL: 350 ms
MDC IDC STAT BRADY AP VP PERCENT: 33.09 %
MDC IDC STAT BRADY AS VS PERCENT: 0.11 %
Zone Setting Detection Interval: 400 ms

## 2013-06-23 NOTE — Assessment & Plan Note (Signed)
She has a very difficult constellation of problems. I have recommended RV lead revision, possible atrial lead revision and possible RV lead extraction. I discussed the risks involved to her son. Will plan to discuss with the patient when she comes in for her procedure. She is feeling poorly.

## 2013-06-23 NOTE — Progress Notes (Signed)
HPI Mrs. Sandin returns today for followup. She is a very pleasant 80 year old woman with a history of a nonischemic cardiomyopathy, complete heart block, status post biventricular pacemaker insertion. Most recently, her right ventricular pacing portion of her defibrillator lead was not working, and she was pacing off of her left ventricular lead only. The patient is approximately nine months from elective replacement. She gets quite symptomatic with pacemaker interrogation, and refused to allow us to interrogate her device in the past but took her benzo's today. She has felt poorly. Her atrial lead is not capturing well. Her energy level is low. No Known Allergies   Current Outpatient Prescriptions  Medication Sig Dispense Refill  . aspirin 325 MG EC tablet Take 325 mg by mouth as needed.       . colchicine (COLCRYS) 0.6 MG tablet take 1 tablet by mouth every 12 hours if needed      . fludrocortisone (FLORINEF) 0.1 MG tablet Take 1 tablet (0.1 mg total) by mouth daily.  30 tablet  3  . traMADol (ULTRAM) 50 MG tablet Take 1 tablet (50 mg total) by mouth every 6 (six) hours as needed.  120 tablet  2  . travoprost, benzalkonium, (TRAVATAN) 0.004 % ophthalmic solution Place 1 drop into both eyes at bedtime.        Marland Kitchen. VITAMIN D-VITAMIN K PO Take 10,000 Units by mouth every other day.       No current facility-administered medications for this visit.     Past Medical History  Diagnosis Date  . Status post biventricular pacemaker     with AICD; 2004; revision 2007  . Complete heart block   . Nonischemic cardiomyopathy     EF 45-50% in 2009  . Systolic heart failure   . Macular degeneration   . Ventricular tachycardia   . Arthritis   . Glaucoma   . Hyperlipidemia   . Essential hypertension, benign   . Hypothyroidism   . Osteoporosis   . Allergy   . Impaired glucose tolerance 02/02/2011  . Vitamin D deficiency 02/02/2011  . Hearing loss 02/02/2011    ROS:   All systems reviewed  and negative except as noted in the HPI.   Past Surgical History  Procedure Laterality Date  . Cardiac defibrillator placement    . Cholecystectomy    . Appendectomy    . Tonsillectomy    . Cataract surgery       Family History  Problem Relation Age of Onset  . Hypertension Mother   . Coronary artery disease Mother   . Arthritis Other   . Cancer Other     lung cancer  . Heart disease Other   . Hypertension Other      History   Social History  . Marital Status: Widowed    Spouse Name: N/A    Number of Children: N/A  . Years of Education: N/A   Occupational History  . retired    Social History Main Topics  . Smoking status: Never Smoker   . Smokeless tobacco: Never Used  . Alcohol Use: No  . Drug Use: No  . Sexual Activity: Not on file   Other Topics Concern  . Not on file   Social History Narrative  . No narrative on file     BP 140/82  Ht 5' 4.5" (1.638 m)  Wt 157 lb (71.215 kg)  BMI 26.54 kg/m2  Physical Exam:  Chronically ill appearing NAD HEENT: Unremarkable Neck:  No JVD, no thyromegally Back:  No CVA tenderness Lungs:  Clear with no wheezes, rales, or rhonchi. HEART:  Regular rate rhythm, no murmurs, no rubs, no clicks Abd:  soft, positive bowel sounds, no organomegally, no rebound, no guarding Ext:  2 plus pulses, no edema, no cyanosis, no clubbing Skin:  No rashes no nodules Neuro:  CN II through XII intact, motor grossly intact   DEVICE  - No RV capture (1324 lead), no atrial capture, LV capture is appropriate  Assess/Plan:

## 2013-06-23 NOTE — Patient Instructions (Signed)
Will call you back after the surgeon's office calls me back tomorrow

## 2013-06-24 ENCOUNTER — Telehealth: Payer: Self-pay | Admitting: Internal Medicine

## 2013-06-24 NOTE — Telephone Encounter (Signed)
New message    Pt states that her son is now at her home.  Want to talk to [facility] regarding a procedure she is supposed to have.  He will be at her home for several hours.

## 2013-06-24 NOTE — Telephone Encounter (Signed)
Will call her son on his home number.  Called and spoke with son.  They are going to wait until March.  After March 9th.  Understands the risk if she waits and something happens.

## 2013-06-24 NOTE — Telephone Encounter (Signed)
New message    Patient / son has decide to wait until march  To have procedure.

## 2013-06-24 NOTE — Telephone Encounter (Signed)
Spoke with patient and let her know Dr Ladona Ridgel recommends she proceed as planned with lead revision and generator change.  She says she does not really have any recollection of what was dicussed yesterday due to her taking 2 Valium prior to coming in for office visit.  Dr Ladona Ridgel explained the procedure to her son who was with her at the visit.  I have recommended to proceed as planned but they(son) are wanting to postpone until March.  I let her know that if she starts to have any problems with dizziness or she passes out she will need to go to the ER.  She requested I speak to her son.  I have tried to call him at his residence at [phone] but got his voicemail.  She states he is on his way to her house and I can call back at her number to call to him.  It takes him approximately 15 min to get to her house

## 2013-06-25 NOTE — Telephone Encounter (Signed)
I spoke with Tiffany [name] and we will schedule for later in March at patient's request

## 2013-07-02 ENCOUNTER — Telehealth: Payer: Self-pay | Admitting: *Deleted

## 2013-07-02 MED ORDER — COLCHICINE 0.6 MG PO TABS: ORAL_TABLET | ORAL | Status: DC

## 2013-07-02 NOTE — Telephone Encounter (Signed)
Patient informed. 

## 2013-07-02 NOTE — Telephone Encounter (Signed)
Patient phoned requesting refill on colchicine.  Last OV with PCP 05/15/13.  Please advise.  CB# [phone]

## 2013-07-02 NOTE — Telephone Encounter (Signed)
Done erx 

## 2013-07-06 ENCOUNTER — Encounter: Payer: Self-pay | Admitting: Internal Medicine

## 2013-07-08 NOTE — Telephone Encounter (Signed)
New Message  Pt called states that there was discussion about surgery on her heart and she would rather wait for may to schedule// requests a call back to discuss.

## 2013-07-09 NOTE — Telephone Encounter (Signed)
Discussed with Dr Ladona Ridgel and he does not feel the patient should wait until May.  Will call her to discuss

## 2013-07-09 NOTE — Telephone Encounter (Signed)
I spoke with the patient and made her aware that Dr. Ladona Ridgel does not feel the patient should wait until May to have her procedure done. This is due mainly because she has the possibility of her lead breaking, which could lead to death. We discussed that there are real risks involved if she decides to proceed now, or decides to wait. We discussed the reasoning for switching the device from the right to the left side. I advised the patient that the ultimate decision is up to her. She is very concerned that her chances of surviving the procedure are not good. I have recommended she discuss this further with her family. She asks that I contact her son Tiffany [name] and speak with him. I did call Tiffany [name] and he states that the patient was convinced she wasn't going to do anything at all. He did convince her at least to do it in May. He has a good understanding of the risks involved and of Dr. Lubertha Basque discussion with the patient at her office visit. I advised that he speak with her again and if she decides to go ahead and proceed in March, we can just schedule this to be done. If they decide to wait until May, I have asked that they call back as soon as they can so we can schedule her to see Dr. Ladona Ridgel in April. The patient's son voices understanding.

## 2013-07-17 ENCOUNTER — Telehealth: Payer: Self-pay | Admitting: Internal Medicine

## 2013-07-17 NOTE — Telephone Encounter (Signed)
New message    Has some questions regarding upcoming surgery.

## 2013-07-17 NOTE — Telephone Encounter (Signed)
Spoke with patient and she has lots of unanswered question despite multiple phone conversations.  She had take 2 Valium at her last appointment and does not remember what Dr Ladona Ridgel said only remembers "1-3 chance of dying on the table. She says that's not good odds."  I have done all i know to do to reassure and move forward with scheduling the procedure  However she wants to talk to him with a "clear" head. She is going o come Mon at 4:30 to discuss

## 2013-07-20 ENCOUNTER — Encounter: Payer: Medicare Other | Admitting: Internal Medicine

## 2013-08-18 ENCOUNTER — Telehealth: Payer: Self-pay | Admitting: Internal Medicine

## 2013-08-18 MED ORDER — CITALOPRAM HYDROBROMIDE 10 MG PO TABS: 10.0000 mg | ORAL_TABLET | Freq: Every day | ORAL | Status: DC

## 2013-08-18 NOTE — Telephone Encounter (Signed)
Patient is calling to request that Dr. Jonny Ruiz send her "a few" anti-depressant pills to [facility] on [address] to see if they do her any good. Please advise.

## 2013-08-18 NOTE — Telephone Encounter (Signed)
Ok for citalopram 10 qd - done erx

## 2013-08-19 NOTE — Telephone Encounter (Signed)
Patient informed rx sent in. 

## 2013-09-28 ENCOUNTER — Telehealth: Payer: Self-pay | Admitting: Internal Medicine

## 2013-09-28 NOTE — Telephone Encounter (Signed)
New message          Pt wants to come in to discuss possible surgery. Pt would like a late afternoon. Can you work her in?

## 2013-09-29 NOTE — Telephone Encounter (Signed)
Efraim Kaufmann is going to call ans schedule patient to come in

## 2013-09-30 ENCOUNTER — Telehealth: Payer: Self-pay | Admitting: Internal Medicine

## 2013-09-30 NOTE — Telephone Encounter (Signed)
Spoke with patient and let her know Tiffany [name] will be calling her to arrange a follow up visit with Dr Ladona Ridgel to discuss

## 2013-09-30 NOTE — Telephone Encounter (Signed)
New message          Pt wants kelly to call her back about her procedure date and time. Pt would like a call at 4pm.

## 2013-10-06 ENCOUNTER — Encounter: Payer: Self-pay | Admitting: Internal Medicine

## 2013-10-06 ENCOUNTER — Ambulatory Visit (INDEPENDENT_AMBULATORY_CARE_PROVIDER_SITE_OTHER): Payer: Medicare Other | Admitting: Internal Medicine

## 2013-10-06 VITALS — BP 187/101 | HR 76 | Ht 64.5 in | Wt 155.0 lb

## 2013-10-06 DIAGNOSIS — Z950 Presence of cardiac pacemaker: Secondary | ICD-10-CM

## 2013-10-06 MED ORDER — CARVEDILOL 12.5 MG PO TABS: 12.5000 mg | ORAL_TABLET | Freq: Two times a day (BID) | ORAL | Status: DC

## 2013-10-06 NOTE — Assessment & Plan Note (Signed)
Her LV lead is working though nothing else. I have encouraged the patient to undergo lead revision. There is clear risk involved both from perforation as well as infection after the procedure. She is very close to ERI and she has class 2-3 heart failure, made worse by failure of her RV and RA leads. There is a possibility that her old PPM lead in the RV is still working. I will check that at the time of her revision. The patient understands her risk and is quite anxious. Appropriately so as she has a very bad problem. We will schedule device revision in the coming weeks.

## 2013-10-06 NOTE — Progress Notes (Signed)
HPI Tiffany [name] returns today for followup. She is a very pleasant [age] year old woman with a history of a nonischemic cardiomyopathy, complete heart block, status post biventricular pacemaker (previously ICD) insertion. Most recently, her right ventricular pacing portion of her defibrillator lead was not working, and she was pacing off of her left ventricular lead only. The patient is approximately nine months from elective replacement. She gets quite symptomatic with pacemaker interrogation. She has felt poorly. Her atrial lead is not capturing well. Her energy level is low. We have discussed lead extraction and insertion of a new RV lead in the past. Her initial pacing system dates back to 1996. No Known Allergies   Current Outpatient Prescriptions  Medication Sig Dispense Refill  . aspirin 325 MG EC tablet Take 325 mg by mouth as needed.       . traMADol (ULTRAM) 50 MG tablet Take 1 tablet (50 mg total) by mouth every 6 (six) hours as needed.  120 tablet  2  . carvedilol (COREG) 12.5 MG tablet Take 1 tablet (12.5 mg total) by mouth 2 (two) times daily.  180 tablet  3   No current facility-administered medications for this visit.     Past Medical History  Diagnosis Date  . Status post biventricular pacemaker     with AICD; 2004; revision 2007  . Complete heart block   . Nonischemic cardiomyopathy     EF 45-50% in 2009  . Systolic heart failure   . Macular degeneration   . Ventricular tachycardia   . Arthritis   . Glaucoma   . Hyperlipidemia   . Essential hypertension, benign   . Hypothyroidism   . Osteoporosis   . Allergy   . Impaired glucose tolerance 02/02/2011  . Vitamin D deficiency 02/02/2011  . Hearing loss 02/02/2011    ROS:   All systems reviewed and negative except as noted in the HPI.   Past Surgical History  Procedure Laterality Date  . Cardiac defibrillator placement    . Cholecystectomy    . Appendectomy    . Tonsillectomy    . Cataract surgery        Family History  Problem Relation Age of Onset  . Hypertension Mother   . Coronary artery disease Mother   . Arthritis Other   . Cancer Other     lung cancer  . Heart disease Other   . Hypertension Other      History   Social History  . Marital Status: Widowed    Spouse Name: N/A    Number of Children: N/A  . Years of Education: N/A   Occupational History  . retired    Social History Main Topics  . Smoking status: Never Smoker   . Smokeless tobacco: Never Used  . Alcohol Use: No  . Drug Use: No  . Sexual Activity: Not on file   Other Topics Concern  . Not on file   Social History Narrative  . No narrative on file     BP 187/101  Pulse 76  Ht 5' 4.5" (1.638 m)  Wt 155 lb (70.308 kg)  BMI 26.20 kg/m2  Physical Exam:  Chronically ill appearing NAD HEENT: Unremarkable Neck:  No JVD, no thyromegally Back:  No CVA tenderness Lungs:  Clear with no wheezes, rales, or rhonchi. HEART:  Regular rate rhythm, no murmurs, no rubs, no clicks Abd:  soft, positive bowel sounds, no organomegally, no rebound, no guarding Ext:  2 plus pulses, no edema,  no cyanosis, no clubbing Skin:  No rashes no nodules Neuro:  CN II through XII intact, motor grossly intact   DEVICE  - No RV capture (6949 lead), no atrial capture, LV capture is appropriate  Assess/Plan:

## 2013-10-06 NOTE — Patient Instructions (Signed)
Will call me back if she wishes to proceed

## 2013-11-17 ENCOUNTER — Other Ambulatory Visit: Payer: Self-pay | Admitting: Internal Medicine

## 2013-11-17 NOTE — Telephone Encounter (Signed)
Faxed hardcopy to [facility] [address] [place] 

## 2013-11-17 NOTE — Telephone Encounter (Signed)
Done hardcopy to robin  

## 2013-12-28 ENCOUNTER — Telehealth: Payer: Self-pay | Admitting: Internal Medicine

## 2013-12-28 NOTE — Telephone Encounter (Signed)
Pt aware of appt on 01-14-2014 at 2:00 PM.

## 2013-12-28 NOTE — Telephone Encounter (Signed)
New problem:     Per pt her battery is about to give out and needs surg she would like to speak to Tresa Endo, I informed her Tresa Endo is not in.

## 2013-12-31 ENCOUNTER — Encounter: Payer: Medicare Other | Admitting: Internal Medicine

## 2014-01-14 ENCOUNTER — Encounter: Payer: Self-pay | Admitting: Internal Medicine

## 2014-01-14 ENCOUNTER — Ambulatory Visit (INDEPENDENT_AMBULATORY_CARE_PROVIDER_SITE_OTHER): Payer: Medicare Other | Admitting: Internal Medicine

## 2014-01-14 VITALS — BP 150/98 | HR 81 | Ht 64.0 in | Wt 156.2 lb

## 2014-01-14 DIAGNOSIS — I4729 Other ventricular tachycardia: Secondary | ICD-10-CM

## 2014-01-14 DIAGNOSIS — I442 Atrioventricular block, complete: Secondary | ICD-10-CM

## 2014-01-14 DIAGNOSIS — I472 Ventricular tachycardia, unspecified: Secondary | ICD-10-CM

## 2014-01-14 DIAGNOSIS — Z950 Presence of cardiac pacemaker: Secondary | ICD-10-CM

## 2014-01-14 DIAGNOSIS — I5022 Chronic systolic (congestive) heart failure: Secondary | ICD-10-CM

## 2014-01-14 LAB — MDC_IDC_ENUM_SESS_TYPE_INCLINIC
Battery Remaining Longevity: 3 mo
Brady Statistic AP VP Percent: 34.83 %
Brady Statistic AP VS Percent: 0.01 %
Brady Statistic AS VS Percent: 0.12 %
Brady Statistic RV Percent Paced: 99.87 %
Lead Channel Impedance Value: 399 Ohm
Lead Channel Impedance Value: 4047 Ohm
Lead Channel Impedance Value: 4047 Ohm
Lead Channel Impedance Value: 4047 Ohm
Lead Channel Impedance Value: 437 Ohm
Lead Channel Impedance Value: 589 Ohm
Lead Channel Pacing Threshold Amplitude: 4.25 V
Lead Channel Pacing Threshold Amplitude: 4.5 V
Lead Channel Pacing Threshold Pulse Width: 0.8 ms
Lead Channel Sensing Intrinsic Amplitude: 0.625 mV
Lead Channel Setting Pacing Amplitude: 3 V
Lead Channel Setting Pacing Amplitude: 6 V
Lead Channel Setting Sensing Sensitivity: 4 mV
MDC IDC MSMT BATTERY VOLTAGE: 2.81 V
MDC IDC MSMT LEADCHNL LV IMPEDANCE VALUE: 4047 Ohm
MDC IDC MSMT LEADCHNL LV IMPEDANCE VALUE: 4047 Ohm
MDC IDC MSMT LEADCHNL RA IMPEDANCE VALUE: 513 Ohm
MDC IDC MSMT LEADCHNL RA SENSING INTR AMPL: 0.625 mV
MDC IDC MSMT LEADCHNL RV PACING THRESHOLD PULSEWIDTH: 0.8 ms
MDC IDC SESS DTM: 20150827143445
MDC IDC SET LEADCHNL LV PACING PULSEWIDTH: 0.8 ms
MDC IDC SET LEADCHNL RV PACING AMPLITUDE: 6 V
MDC IDC SET LEADCHNL RV PACING PULSEWIDTH: 0.8 ms
MDC IDC SET ZONE DETECTION INTERVAL: 350 ms
MDC IDC SET ZONE DETECTION INTERVAL: 400 ms
MDC IDC STAT BRADY AS VP PERCENT: 65.04 %
MDC IDC STAT BRADY RA PERCENT PACED: 34.84 %

## 2014-01-14 NOTE — Assessment & Plan Note (Signed)
Her chronic systolic heart failure remains class II. No change in medical therapy.

## 2014-01-14 NOTE — Patient Instructions (Addendum)
Call Tresa Endo back when you are ready to schedule change out [phone]  9/9, 9/14, 9/16, 9/18, 9/23, 9/28

## 2014-01-14 NOTE — Progress Notes (Signed)
HPI Tiffany [name] returns today for followup. She is a pleasant elderly woman with a h/o complete heart block, chronic systolic heart failure and multiple lead issues. Her pacing system was initially placed in 1996, with upgrade to a dual-chamber ICD, followed by a biventricular pacemaker. Her pacing/sense portion of her ICD lead has failed. She has an elevated left ventricular pacing threshold. Her right atrial lead does not pace does sense. With all this, I've recommended pacemaker revision. She now has less than 3 months of battery longevity assuming her device continues to work as it has. Her left ventricular pacing has gradually worsened and required more output. I've explained this to the patient and her son on many occasions , but she has always refused to have anything done. The patient uses chronic benzodiazepines. Today she was not allowed to have any. She has not had syncope.  No Known Allergies   Current Outpatient Prescriptions  Medication Sig Dispense Refill  . aspirin 325 MG EC tablet Take 325 mg by mouth as needed for pain.       . carvedilol (COREG) 12.5 MG tablet Take 1 tablet (12.5 mg total) by mouth 2 (two) times daily.  180 tablet  3  . colchicine 0.6 MG tablet Take 1 tablet by mouth as needed. (for Gout flare-ups)      . traMADol (ULTRAM) 50 MG tablet take 1 tablet by mouth four times a day if needed for severe pain and/or sleep (MUST HAVE OFFICE VISIT FOR MORE REFILLS)      . TRAVATAN Z 0.004 % SOLN ophthalmic solution Place 1 drop into both eyes at bedtime.       No current facility-administered medications for this visit.     Past Medical History  Diagnosis Date  . Status post biventricular pacemaker     with AICD; 2004; revision 2007  . Complete heart block   . Nonischemic cardiomyopathy     EF 45-50% in 2009  . Systolic heart failure   . Macular degeneration   . Ventricular tachycardia   . Arthritis   . Glaucoma   . Hyperlipidemia   . Essential  hypertension, benign   . Hypothyroidism   . Osteoporosis   . Allergy   . Impaired glucose tolerance 02/02/2011  . Vitamin D deficiency 02/02/2011  . Hearing loss 02/02/2011    ROS:   All systems reviewed and negative except as noted in the HPI.   Past Surgical History  Procedure Laterality Date  . Cardiac defibrillator placement    . Cholecystectomy    . Appendectomy    . Tonsillectomy    . Cataract surgery       Family History  Problem Relation Age of Onset  . Hypertension Mother   . Coronary artery disease Mother   . Arthritis Other   . Cancer Other     lung cancer  . Heart disease Other   . Hypertension Other      History   Social History  . Marital Status: Widowed    Spouse Name: N/A    Number of Children: N/A  . Years of Education: N/A   Occupational History  . retired    Social History Main Topics  . Smoking status: Never Smoker   . Smokeless tobacco: Never Used  . Alcohol Use: No  . Drug Use: No  . Sexual Activity: Not on file   Other Topics Concern  . Not on file   Social History Narrative  .  No narrative on file     BP 150/98  Pulse 81  Ht  (1.626 m)  Wt 156 lb 3.2 oz (70.852 kg)  BMI 26.80 kg/m2  Physical Exam:  Chronically ill appearing 80 year old woman, NAD HEENT: Unremarkable Neck:  No JVD, no thyromegally Back:  No CVA tenderness Lungs:  Clear with no wheezes, rales, or rhonchi. HEART:  Regular rate rhythm, no murmurs, no rubs, no clicks Abd:  soft, positive bowel sounds, no organomegally, no rebound, no guarding Ext:  2 plus pulses, no edema, no cyanosis, no clubbing Skin:  No rashes no nodules Neuro:  CN II through XII intact, motor grossly intact  EKG - normal sinus rhythm with ventricular pacing  DEVICE  Normal device function.  See PaceArt for details. Elevated left ventricular pacing threshold is chronic  Assess/Plan:

## 2014-01-14 NOTE — Assessment & Plan Note (Signed)
Her device is at elective replacement. I've recommended removing the old device, attempting to use the old right ventricular pacing lead and atrial lead and left ventricular pacing lead. If her right ventricular pacing lead is nonfunctional, then lead extraction would be required. We'll schedule this in several weeks.

## 2014-01-20 ENCOUNTER — Telehealth: Payer: Self-pay | Admitting: Internal Medicine

## 2014-01-20 DIAGNOSIS — I442 Atrioventricular block, complete: Secondary | ICD-10-CM

## 2014-01-20 DIAGNOSIS — Z45018 Encounter for adjustment and management of other part of cardiac pacemaker: Secondary | ICD-10-CM

## 2014-01-20 DIAGNOSIS — I428 Other cardiomyopathies: Secondary | ICD-10-CM

## 2014-01-20 DIAGNOSIS — T82110A Breakdown (mechanical) of cardiac electrode, initial encounter: Secondary | ICD-10-CM

## 2014-01-20 NOTE — Telephone Encounter (Signed)
New message     Talk to the nurse regarding scheduling a pacemaker inplant for his mother

## 2014-01-20 NOTE — Telephone Encounter (Signed)
Called wanting to schedule pacemaker.  Would like to see when Dr. Ladona Ridgel would be available the 1st or 2nd week in October.  When he knows those dates then he will call back to schedule.  Advised would forward to Anselm Pancoast to let him know what dates Dr. Ladona Ridgel would be available.

## 2014-01-22 NOTE — Telephone Encounter (Signed)
lmom for son with all dates for Oct.

## 2014-01-26 ENCOUNTER — Encounter: Payer: Self-pay | Admitting: Internal Medicine

## 2014-01-28 ENCOUNTER — Telehealth: Payer: Self-pay | Admitting: *Deleted

## 2014-01-28 MED ORDER — BUPROPION HCL ER (XL) 150 MG PO TB24: 150.0000 mg | ORAL_TABLET | Freq: Every day | ORAL | Status: DC

## 2014-01-28 NOTE — Telephone Encounter (Signed)
Pt states she is wanting to see if md can rx her some lose dose Wellbutrin. Been feeling depress and not sleeping at night. Pt states she doesn't have transportation right now to come for appt her son car is in the shop. Pls advise.Marland KitchenRaechel Chute

## 2014-01-28 NOTE — Telephone Encounter (Signed)
Done erx 

## 2014-01-28 NOTE — Telephone Encounter (Signed)
Notified pt with md response. rx has been sent.Tiffany KitchenRaechel Chute

## 2014-02-10 ENCOUNTER — Encounter: Payer: Self-pay | Admitting: Internal Medicine

## 2014-02-10 ENCOUNTER — Telehealth: Payer: Self-pay | Admitting: Internal Medicine

## 2014-02-10 NOTE — Telephone Encounter (Signed)
Please consider OV to make sure this is the case, as our office policy is normally OV required for antibx tx (to avoid overtx with antibx inappropriately and then developing antibx that no longer work)

## 2014-02-10 NOTE — Telephone Encounter (Signed)
Spoke with [name] and let him know I will call him back on Tues.  Will have it scheduled for 03/08/14

## 2014-02-10 NOTE — Telephone Encounter (Signed)
Tiffany Gauze, RN at 02/10/2014 5:57 PM    Status: Signed       Spoke with [name] and let him know I will call him back on Tues. Will have it scheduled for 03/08/14       [name] at 02/10/2014 3:01 PM     Status: Signed        Pt's son would like to speak with you regarding scheduling 03/08/14 for pacemaker replacement. Please call and advise.

## 2014-02-10 NOTE — Telephone Encounter (Signed)
°  Pt's son would like to speak with you regarding scheduling 03/08/14 for pacemaker replacement. Please call and advise.

## 2014-02-10 NOTE — Telephone Encounter (Signed)
Patient states she has a bladder infection.  She is requesting Dr. Jonny Ruiz to call her something in.  Her pharmacy is [facility] on [address].

## 2014-02-10 NOTE — Telephone Encounter (Signed)
This encounter was created in error - please disregard.

## 2014-02-10 NOTE — Telephone Encounter (Signed)
Patient informed of MD instructions.  She did agree to schedule appointment but would need to check with her son as he would need to bring her in, she will call back.

## 2014-02-17 ENCOUNTER — Other Ambulatory Visit: Payer: Self-pay | Admitting: *Deleted

## 2014-02-17 ENCOUNTER — Encounter: Payer: Self-pay | Admitting: *Deleted

## 2014-02-17 DIAGNOSIS — T82110A Breakdown (mechanical) of cardiac electrode, initial encounter: Secondary | ICD-10-CM

## 2014-02-17 DIAGNOSIS — Z45018 Encounter for adjustment and management of other part of cardiac pacemaker: Secondary | ICD-10-CM

## 2014-02-17 DIAGNOSIS — I428 Other cardiomyopathies: Secondary | ICD-10-CM

## 2014-02-17 NOTE — Telephone Encounter (Signed)
Spoke with son and have the procedure scheduled for 11:00 on 03/08/14  Be at the hospital at 9:00am.  NPO after midnight  She will have her labs on 03/01/14.  Son aware of all dates and times

## 2014-02-19 ENCOUNTER — Encounter: Payer: Self-pay | Admitting: Cardiology

## 2014-02-26 ENCOUNTER — Encounter (HOSPITAL_COMMUNITY): Payer: Self-pay | Admitting: Pharmacy Technician

## 2014-03-01 ENCOUNTER — Other Ambulatory Visit (INDEPENDENT_AMBULATORY_CARE_PROVIDER_SITE_OTHER): Payer: Medicare Other | Admitting: *Deleted

## 2014-03-01 DIAGNOSIS — I442 Atrioventricular block, complete: Secondary | ICD-10-CM

## 2014-03-01 DIAGNOSIS — I428 Other cardiomyopathies: Secondary | ICD-10-CM

## 2014-03-01 DIAGNOSIS — I429 Cardiomyopathy, unspecified: Secondary | ICD-10-CM

## 2014-03-01 DIAGNOSIS — Z45018 Encounter for adjustment and management of other part of cardiac pacemaker: Secondary | ICD-10-CM

## 2014-03-01 DIAGNOSIS — T82110A Breakdown (mechanical) of cardiac electrode, initial encounter: Secondary | ICD-10-CM

## 2014-03-02 ENCOUNTER — Other Ambulatory Visit: Payer: Medicare Other

## 2014-03-02 LAB — CBC WITH DIFFERENTIAL/PLATELET
BASOS ABS: 0.1 10*3/uL (ref 0.0–0.1)
BASOS PCT: 0.7 % (ref 0.0–3.0)
EOS ABS: 0.3 10*3/uL (ref 0.0–0.7)
Eosinophils Relative: 3.6 % (ref 0.0–5.0)
HEMATOCRIT: 35.4 % — AB (ref 36.0–46.0)
Hemoglobin: 11.7 g/dL — ABNORMAL LOW (ref 12.0–15.0)
Lymphocytes Relative: 32.1 % (ref 12.0–46.0)
Lymphs Abs: 2.6 10*3/uL (ref 0.7–4.0)
MCHC: 33 g/dL (ref 30.0–36.0)
MCV: 101.8 fl — ABNORMAL HIGH (ref 78.0–100.0)
Monocytes Absolute: 0.5 10*3/uL (ref 0.1–1.0)
Monocytes Relative: 5.6 % (ref 3.0–12.0)
NEUTROS ABS: 4.7 10*3/uL (ref 1.4–7.7)
Neutrophils Relative %: 58 % (ref 43.0–77.0)
Platelets: 159 10*3/uL (ref 150.0–400.0)
RBC: 3.48 Mil/uL — ABNORMAL LOW (ref 3.87–5.11)
RDW: 14.2 % (ref 11.5–15.5)
WBC: 8.1 10*3/uL (ref 4.0–10.5)

## 2014-03-02 LAB — BASIC METABOLIC PANEL
BUN: 25 mg/dL — ABNORMAL HIGH (ref 6–23)
CALCIUM: 9.6 mg/dL (ref 8.4–10.5)
CHLORIDE: 106 meq/L (ref 96–112)
CO2: 22 meq/L (ref 19–32)
Creatinine, Ser: 1.2 mg/dL (ref 0.4–1.2)
GFR: 47.15 mL/min — ABNORMAL LOW (ref >60.00)
GLUCOSE: 102 mg/dL — AB (ref 70–99)
POTASSIUM: 4.1 meq/L (ref 3.5–5.1)
Sodium: 139 mEq/L (ref 135–145)

## 2014-03-07 DIAGNOSIS — I10 Essential (primary) hypertension: Secondary | ICD-10-CM | POA: Diagnosis not present

## 2014-03-07 DIAGNOSIS — I5022 Chronic systolic (congestive) heart failure: Secondary | ICD-10-CM | POA: Diagnosis not present

## 2014-03-07 DIAGNOSIS — Y831 Surgical operation with implant of artificial internal device as the cause of abnormal reaction of the patient, or of later complication, without mention of misadventure at the time of the procedure: Secondary | ICD-10-CM | POA: Diagnosis not present

## 2014-03-07 DIAGNOSIS — Z7982 Long term (current) use of aspirin: Secondary | ICD-10-CM | POA: Diagnosis not present

## 2014-03-07 DIAGNOSIS — I442 Atrioventricular block, complete: Secondary | ICD-10-CM | POA: Diagnosis not present

## 2014-03-07 DIAGNOSIS — E785 Hyperlipidemia, unspecified: Secondary | ICD-10-CM | POA: Diagnosis not present

## 2014-03-07 DIAGNOSIS — I429 Cardiomyopathy, unspecified: Secondary | ICD-10-CM | POA: Diagnosis not present

## 2014-03-07 DIAGNOSIS — T82110A Breakdown (mechanical) of cardiac electrode, initial encounter: Secondary | ICD-10-CM | POA: Diagnosis not present

## 2014-03-07 DIAGNOSIS — E039 Hypothyroidism, unspecified: Secondary | ICD-10-CM | POA: Diagnosis not present

## 2014-03-07 DIAGNOSIS — Z45010 Encounter for checking and testing of cardiac pacemaker pulse generator [battery]: Secondary | ICD-10-CM | POA: Diagnosis not present

## 2014-03-07 MED ORDER — CEFAZOLIN SODIUM-DEXTROSE 2-3 GM-% IV SOLR: 2.0000 g | INTRAVENOUS | Status: DC

## 2014-03-07 MED ORDER — SODIUM CHLORIDE 0.9 % IV SOLN
INTRAVENOUS | Status: DC
  Administered 2014-03-08: 10:00:00 via INTRAVENOUS

## 2014-03-07 MED ORDER — SODIUM CHLORIDE 0.9 % IR SOLN
80.0000 mg | Status: DC
  Filled 2014-03-07 (×2): qty 2

## 2014-03-08 ENCOUNTER — Encounter (HOSPITAL_COMMUNITY)
Admission: RE | Disposition: A | Discharge status: Home / Self Care | Payer: Medicare Other | Source: Ambulatory Visit | Attending: Internal Medicine

## 2014-03-08 ENCOUNTER — Encounter (HOSPITAL_COMMUNITY): Payer: Self-pay | Admitting: *Deleted

## 2014-03-08 ENCOUNTER — Ambulatory Visit (HOSPITAL_COMMUNITY)
Admission: RE | Admit: 2014-03-08 | Discharge: 2014-03-09 | Disposition: A | Discharge status: Home / Self Care | Payer: Medicare Other | Source: Ambulatory Visit | Attending: Internal Medicine | Admitting: Internal Medicine

## 2014-03-08 DIAGNOSIS — Z7982 Long term (current) use of aspirin: Secondary | ICD-10-CM | POA: Insufficient documentation

## 2014-03-08 DIAGNOSIS — I428 Other cardiomyopathies: Secondary | ICD-10-CM

## 2014-03-08 DIAGNOSIS — I5022 Chronic systolic (congestive) heart failure: Secondary | ICD-10-CM | POA: Diagnosis not present

## 2014-03-08 DIAGNOSIS — Z45018 Encounter for adjustment and management of other part of cardiac pacemaker: Secondary | ICD-10-CM

## 2014-03-08 DIAGNOSIS — T82110A Breakdown (mechanical) of cardiac electrode, initial encounter: Secondary | ICD-10-CM

## 2014-03-08 DIAGNOSIS — I442 Atrioventricular block, complete: Secondary | ICD-10-CM

## 2014-03-08 DIAGNOSIS — Y831 Surgical operation with implant of artificial internal device as the cause of abnormal reaction of the patient, or of later complication, without mention of misadventure at the time of the procedure: Secondary | ICD-10-CM | POA: Insufficient documentation

## 2014-03-08 DIAGNOSIS — I10 Essential (primary) hypertension: Secondary | ICD-10-CM | POA: Insufficient documentation

## 2014-03-08 DIAGNOSIS — E039 Hypothyroidism, unspecified: Secondary | ICD-10-CM | POA: Insufficient documentation

## 2014-03-08 DIAGNOSIS — Z45010 Encounter for checking and testing of cardiac pacemaker pulse generator [battery]: Secondary | ICD-10-CM | POA: Insufficient documentation

## 2014-03-08 DIAGNOSIS — I429 Cardiomyopathy, unspecified: Secondary | ICD-10-CM | POA: Insufficient documentation

## 2014-03-08 DIAGNOSIS — E785 Hyperlipidemia, unspecified: Secondary | ICD-10-CM | POA: Insufficient documentation

## 2014-03-08 HISTORY — PX: BI-VENTRICULAR PACEMAKER REVISION (CRT-R): SHX5751

## 2014-03-08 HISTORY — PX: BI-VENTRICULAR PACEMAKER REVISION: SHX5463

## 2014-03-08 LAB — SURGICAL PCR SCREEN
MRSA, PCR: NEGATIVE
STAPHYLOCOCCUS AUREUS: NEGATIVE

## 2014-03-08 SURGERY — BI-VENTRICULAR PACEMAKER REVISION (CRT-R)
Anesthesia: LOCAL

## 2014-03-08 MED ORDER — LIDOCAINE HCL (PF) 1 % IJ SOLN
INTRAMUSCULAR | Status: AC
  Filled 2014-03-08: qty 30

## 2014-03-08 MED ORDER — MUPIROCIN 2 % EX OINT
1.0000 "application " | TOPICAL_OINTMENT | Freq: Once | CUTANEOUS | Status: AC
  Administered 2014-03-08: 1 via TOPICAL
  Filled 2014-03-08: qty 22

## 2014-03-08 MED ORDER — CHLORHEXIDINE GLUCONATE 4 % EX LIQD
60.0000 mL | Freq: Once | CUTANEOUS | Status: DC
  Filled 2014-03-08: qty 60

## 2014-03-08 MED ORDER — ACETAMINOPHEN 325 MG PO TABS
325.0000 mg | ORAL_TABLET | ORAL | Status: DC | PRN
Start: 2014-03-08 — End: 2014-03-09

## 2014-03-08 MED ORDER — FENTANYL CITRATE 0.05 MG/ML IJ SOLN
INTRAMUSCULAR | Status: AC
  Filled 2014-03-08: qty 2

## 2014-03-08 MED ORDER — VITAMIN D3 25 MCG (1000 UNIT) PO TABS
1000.0000 [IU] | ORAL_TABLET | Freq: Every day | ORAL | Status: DC
  Administered 2014-03-08: 1000 [IU] via ORAL
  Filled 2014-03-08 (×4): qty 1

## 2014-03-08 MED ORDER — TRAMADOL HCL 50 MG PO TABS
50.0000 mg | ORAL_TABLET | Freq: Four times a day (QID) | ORAL | Status: DC | PRN
  Administered 2014-03-08 – 2014-03-09 (×2): 50 mg via ORAL
  Filled 2014-03-08 (×4): qty 1

## 2014-03-08 MED ORDER — ONDANSETRON HCL 4 MG/2ML IJ SOLN: 4.0000 mg | Freq: Four times a day (QID) | INTRAMUSCULAR | Status: DC | PRN

## 2014-03-08 MED ORDER — MIDAZOLAM HCL 5 MG/5ML IJ SOLN
INTRAMUSCULAR | Status: AC
  Filled 2014-03-08: qty 5

## 2014-03-08 MED ORDER — CARVEDILOL 12.5 MG PO TABS
12.5000 mg | ORAL_TABLET | Freq: Two times a day (BID) | ORAL | Status: DC
  Administered 2014-03-08 (×2): 12.5 mg via ORAL
  Filled 2014-03-08 (×3): qty 1

## 2014-03-08 MED ORDER — ADULT MULTIVITAMIN W/MINERALS CH
1.0000 | ORAL_TABLET | Freq: Every day | ORAL | Status: DC
  Administered 2014-03-08: 1 via ORAL
  Filled 2014-03-08 (×2): qty 1

## 2014-03-08 MED ORDER — FENTANYL CITRATE 0.05 MG/ML IJ SOLN: 25.0000 ug | INTRAMUSCULAR | Status: DC | PRN

## 2014-03-08 MED ORDER — MUPIROCIN 2 % EX OINT
TOPICAL_OINTMENT | CUTANEOUS | Status: AC
  Filled 2014-03-08: qty 22

## 2014-03-08 MED ORDER — BUPROPION HCL ER (XL) 150 MG PO TB24
150.0000 mg | ORAL_TABLET | Freq: Every day | ORAL | Status: DC
  Administered 2014-03-08: 150 mg via ORAL
  Filled 2014-03-08 (×2): qty 1

## 2014-03-08 MED ORDER — CEFAZOLIN SODIUM 1-5 GM-% IV SOLN
1.0000 g | Freq: Four times a day (QID) | INTRAVENOUS | Status: AC
  Administered 2014-03-08 – 2014-03-09 (×3): 1 g via INTRAVENOUS
  Filled 2014-03-08 (×4): qty 50

## 2014-03-08 NOTE — H&P (Signed)
HPI Tiffany [name] returns today for followup. She is a pleasant elderly woman with a h/o complete heart block, chronic systolic heart failure and multiple lead issues. Her pacing system was initially placed in 1996, with upgrade to a dual-chamber ICD, followed by a biventricular pacemaker. Her pacing/sense portion of her ICD lead has failed. She has an elevated left ventricular pacing threshold. Her right atrial lead does not pace does sense. With all this, I've recommended pacemaker revision. She now has less than 3 months of battery longevity assuming her device continues to work as it has. Her left ventricular pacing has gradually worsened and required more output. I've explained this to the patient and her son on many occasions , but she has always refused to have anything done. The patient uses chronic benzodiazepines. Today she was not allowed to have any. She has not had syncope.   No Known Allergies      Current Outpatient Prescriptions   Medication  Sig  Dispense  Refill   .  aspirin 325 MG EC tablet  Take 325 mg by mouth as needed for pain.          .  carvedilol (COREG) 12.5 MG tablet  Take 1 tablet (12.5 mg total) by mouth 2 (two) times daily.   180 tablet   3   .  colchicine 0.6 MG tablet  Take 1 tablet by mouth as needed. (for Gout flare-ups)         .  traMADol (ULTRAM) 50 MG tablet  take 1 tablet by mouth four times a day if needed for severe pain and/or sleep (MUST HAVE OFFICE VISIT FOR MORE REFILLS)         .  TRAVATAN Z 0.004 % SOLN ophthalmic solution  Place 1 drop into both eyes at bedtime.             No current facility-administered medications for this visit.           Past Medical History   Diagnosis  Date   .  Status post biventricular pacemaker         with AICD; 2004; revision 2007   .  Complete heart block     .  Nonischemic cardiomyopathy         EF 45-50% in 2009   .  Systolic heart failure     .  Macular degeneration     .  Ventricular tachycardia     .   Arthritis     .  Glaucoma     .  Hyperlipidemia     .  Essential hypertension, benign     .  Hypothyroidism     .  Osteoporosis     .  Allergy     .  Impaired glucose tolerance  02/02/2011   .  Vitamin D deficiency  02/02/2011   .  Hearing loss  02/02/2011        ROS:    All systems reviewed and negative except as noted in the HPI.      Past Surgical History   Procedure  Laterality  Date   .  Cardiac defibrillator placement       .  Cholecystectomy       .  Appendectomy       .  Tonsillectomy       .  Cataract surgery               Family History   Problem  Relation  Age of Onset   .  Hypertension  Mother     .  Coronary artery disease  Mother     .  Arthritis  Other     .  Cancer  Other         lung cancer   .  Heart disease  Other     .  Hypertension  Other             History       Social History   .  Marital Status:  Widowed       Spouse Name:  N/A       Number of Children:  N/A   .  Years of Education:  N/A       Occupational History   .  retired         Social History Main Topics   .  Smoking status:  Never Smoker    .  Smokeless tobacco:  Never Used   .  Alcohol Use:  No   .  Drug Use:  No   .  Sexual Activity:  Not on file       Other Topics  Concern   .  Not on file       Social History Narrative   .  No narrative on file          BP 150/98  Pulse 81  Ht 5\' 4"  (1.626 m)  Wt 156 lb 3.2 oz (70.852 kg)  BMI 26.80 kg/m2   Physical Exam:   Chronically ill appearing 80 year old woman, NAD HEENT: Unremarkable Neck:  No JVD, no thyromegally Back:  No CVA tenderness Lungs:  Clear with no wheezes, rales, or rhonchi. HEART:  Regular rate rhythm, no murmurs, no rubs, no clicks Abd:  soft, positive bowel sounds, no organomegally, no rebound, no guarding Ext:  2 plus pulses, no edema, no cyanosis, no clubbing Skin:  No rashes no nodules Neuro:  CN II through XII intact, motor grossly intact   EKG - normal sinus rhythm  with ventricular pacing   DEVICE   Normal device function.  See PaceArt for details. Elevated left ventricular pacing threshold is chronic   Assess/Plan:         Biventricular cardiac pacemaker in situ -  Biventricular cardiac pacemaker in situ    Her device is at elective replacement. I've recommended removing the old device, attempting to use the old right ventricular pacing lead and atrial lead and left ventricular pacing lead. If her right ventricular pacing lead is nonfunctional, then lead extraction may be required. We'll schedule this in several weeks.         Systolic heart failure - Her chronic systolic heart failure remains class II. No change in medical therapy.    Leonia ReevesGregg Taylor,M.D.

## 2014-03-08 NOTE — CV Procedure (Signed)
Electrophysiology procedure note  Procedure performed: Removal of a previously implanted biventricular pacemaker and insertion of a new biventricular pacemaker with utilization of a previously abandoned biventricular lead, and requiring pacemaker pocket revision.  Preprocedure diagnosis: Clear heart block with permanent pacemaker insertion, and malfunctioning right ventricular pacing lead  Post procedure diagnosis: Same as preprocedure diagnosis  Description of the procedure: After informed consent was obtained, the patient was taken to the diagnostic electrophysiology laboratory in the fasting state. After the usual preparation draping, and intravenous Versed and fentanyl were used for sedation. 30 cc of lidocaine was infiltrated into the right infraclavicular region. A 7 cm incision was carried out. Electrocautery with a plasma blade was utilized to dissect down to the pacemaker pocket. Electrocautery was utilized to free up the very dense fibrous adhesions around the leads and the pacemaker generator. This required approximately 45 minutes. The defibrillator lead was not working. Additional electrocautery was utilized to dissect down to the previously abandoned biventricular pacing lead. It was found to be working satisfactorily. The pacing threshold was less than 2 V and pacing impedance was stable. There were no R waves because of the patient's underlying complete heart block. The new Medtronic dual chamber, biventricular pacemaker, was connected to the old right ventricular lead. It was then connected to the old atrial lead and the old left ventricular lead. The old broken ICD lead was capped. Electrocautery and blunt dissection was utilized to create a subpectoral pocket. Electrocautery was utilized to assure hemostasis. An antibiotic pouch was then used to encapsulate the leads and the generator. The entire pouch was placed in the subpectoral pocket. The pocket was irrigated with antibiotic  irrigation. The tissues were re-approximated with 2-0 Vicryl suture and the skin was closed with a combination of 2-0 and 3-0 Vicryl suture. Benzoin and Steri-Strips were painted on the skin. A pressure dressing was applied. The patient was returned to the recovery area in satisfactory condition. Estimated blood loss was less than 10 cc.  Complications: There were no immediate procedure complications  Conclusion: Successful removal of a previously implanted biventricular pacemaker which had reached elective replacement followed by insertion of a new biventricular pacemaker utilizing a previously abandon right ventricular lead for biventricular pacing.  Lewayne Bunting, M.D.

## 2014-03-08 NOTE — Interval H&P Note (Signed)
History and Physical Interval Note: No change in the history, physical exam, assessment and plan. For PPM generator removal, insertion of a new generator with reattachment to the old pacing lead.  03/08/2014 11:00 AM  Tiffany [name]  has presented today for surgery, with the diagnosis of leaad malfunction/eri  The various methods of treatment have been discussed with the patient and family. After consideration of risks, benefits and other options for treatment, the patient has consented to  Procedure(s): BI-VENTRICULAR PACEMAKER REVISION (CRT-R) (N/A) as a surgical intervention .  The patient's history has been reviewed, patient examined, no change in status, stable for surgery.  I have reviewed the patient's chart and labs.  Questions were answered to the patient's satisfaction.     Tiffany [name].D.

## 2014-03-09 ENCOUNTER — Encounter (HOSPITAL_COMMUNITY): Payer: Self-pay | Admitting: *Deleted

## 2014-03-09 ENCOUNTER — Encounter: Payer: Self-pay | Admitting: Internal Medicine

## 2014-03-09 DIAGNOSIS — Z45010 Encounter for checking and testing of cardiac pacemaker pulse generator [battery]: Secondary | ICD-10-CM | POA: Diagnosis not present

## 2014-03-09 MED ORDER — CEPHALEXIN 500 MG PO CAPS: 500.0000 mg | ORAL_CAPSULE | Freq: Two times a day (BID) | ORAL | Status: DC

## 2014-03-09 NOTE — Discharge Instructions (Signed)
Supplemental Discharge Instructions for  Pacemaker Patients  Activity No heavy lifting or vigorous activity with your left/right arm for 6 to 8 weeks.  Do not raise your left/right arm above your head for one week.  Gradually raise your affected arm as drawn below.            03/11/14                        03/12/14                    03/13/14              03/14/14  NO DRIVING unless cleared by your doctor at your follow-up appointment  WOUND CARE   Keep the wound area clean and dry.  Do not get this area wet for one week. No showers for one week; you may shower on  03/16/14  .   The tape/steri-strips on your wound will fall off; do not pull them off.  No bandage is needed on the site.  DO  NOT apply any creams, oils, or ointments to the wound area.   If you notice any drainage or discharge from the wound, any swelling or bruising at the site, or you develop a fever > 101? F after you are discharged home, call the office at once.  Special Instructions   You are still able to use cellular telephones; use the ear opposite the side where you have your pacemaker.  Avoid carrying your cellular phone near your device.   When traveling through airports, show security personnel your identification card to avoid being screened in the metal detectors.  Ask the security personnel to use the hand wand.   Avoid arc welding equipment, MRI testing (magnetic resonance imaging), TENS units (transcutaneous nerve stimulators).  Call the office for questions about other devices.   Avoid electrical appliances that are in poor condition or are not properly grounded.   Microwave ovens are safe to be near or to operate.   Pacemaker Battery Change A pacemaker battery usually lasts 4 to 12 years. Once or twice per year, you will be asked to visit your health care provider to have a full evaluation of your pacemaker. When a battery needs to be replaced, the entire pacemaker is replaced so that you can benefit  from new circuitry and any new features that have been added to pacemakers. Most often, this procedure is very simple because the leads are already in place.  There are many things that affect how long a pacemaker battery will last, including:   The age of the pacemaker.   The number of leads (1, 2, or 3).   The pacemaker work load. If the pacemaker is helping the heart more often, the battery will not last as long as it would if the pacemaker did not need to help the heart.   Power (voltage) settings. LET [facility] CARE PROVIDER KNOW ABOUT:   Any allergies you have.   All medicines you are taking, including vitamins, herbs, eye drops, creams, and over-the-counter medicines.   Previous problems you or members of your family have had with the use of anesthetics.   Any blood disorders you have.   Previous surgeries you have had, especially since your last pacemaker placement.   Medical conditions you have.   Possibility of pregnancy, if this applies.  Symptoms of chest pain, trouble breathing, palpitations, light-headedness, or feelings of  an abnormal or irregular heartbeat. RISKS AND COMPLICATIONS  Generally, this is a safe procedure. However, as with any procedure, problems can occur and include:   Bleeding.   Bruising of the skin around where the incision was made.   Pain at the incision site.   Pulling apart of the skin at the incision site.   Infection.   Allergic reaction to anesthetics or other medicines used during the procedure.  People with diabetes may have a temporary increase in their blood sugar after any surgical procedure.  BEFORE THE PROCEDURE   Wash all of the skin around the area of the chest where the pacemaker is located.   Ask your health care provider for help with any medicine adjustments before the pacemaker is replaced.   Do not eat or drink anything after midnight on the night before the procedure or as directed by your  health care provider.  Ask your health care provider if you can take a sip of water with any approved medicines the morning of the procedure. PROCEDURE   After giving medicine to numb the skin (local anesthetic), your health care provider will make a cut to reopen the pocket holding the pacemaker.   The old pacemaker will be disconnected from its leads.   The leads will be tested.   If needed, the leads will be replaced. If the leads are functioning properly, the new pacemaker may be connected to the existing leads.  A heart monitor and the pacemaker programmer will be used to make sure that the new pacemaker is working properly.  The incision site will then be closed. A dressing will be placed over the pacemaker site. The dressing will be removed 24-48 hours afterward. AFTER THE PROCEDURE   You will be taken to a recovery area after the new pacemaker implant is completed. Your vital signs such as blood pressure, heart rate, breathing, and oxygen levels will be monitored.  Your health care provider will tell you when you will need to next test your pacemaker or when to return to the office for follow-up for removal of stitches. Document Released: 08/15/2006 Document Revised: 09/21/2013 Document Reviewed: 11/19/2012 [facility] Patient Information 2015 [employer], [place]. This information is not intended to replace advice given to you by your health care provider. Make sure you discuss any questions you have with your health care provider.

## 2014-03-09 NOTE — Progress Notes (Signed)
Reviewed discharge instructions with patient and son and they stated their understanding.  Patient request to take daily medications at home.  Discharged home with son via wheelchair.   Filed Vitals:   03/09/14 0700  BP: 132/46  Pulse: 73  Temp: 98.4 F (36.9 C)  Resp: 18    Deshannon Hinchliffe, Neoma Laming

## 2014-03-09 NOTE — Discharge Summary (Signed)
ELECTROPHYSIOLOGY PROCEDURE DISCHARGE SUMMARY    Patient ID: [name],  MRN: [id number], DOB/AGE: 06/04/1922 80 y.o.  Admit date: 03/08/2014 Discharge date: 03/09/2014  Primary Care Physician: Oliver Barre, MD Electrophysiologist: Ladona Ridgel  Primary Discharge Diagnosis:  CRTP at elective replacement indicator with malfunctioning RV lead; status post CRTP generator change with use of previously abandoned RV lead. Patient admitted for pain control.   Secondary Discharge Diagnosis:  1.  Complete heart block 2.  Systolic heart failure 3.  Non ischemic cardiomyopathy 4.  Hypertension 5.  Hyperlipidemia 6.  Hypothyroidism   No Known Allergies   Procedures This Admission:  1.  CRTP generator change with capping of RV ICD lead and using previously abandoned RV pacing lead on 03-08-14 by Dr Ladona Ridgel.  The patient received a MDT CRTP.  See op note for full details.  There were no early apparent complications.   Brief HPI: Tiffany [name] is a 80 y.o. female with a past medical history as outlined above.  She underwent pacemaker implantation originally in 1996 with subsequent upgrade to dual chamber ICD followed up upgrade to CRTP.  Her RV lead has had increasing thresholds over time. With underlying complete heart block, pacemaker system revision was recommended with the plan to use previously abandoned RV pacing lead.  Risks, benefits, and alternatives were reviewed with the patient who wished to proceed.   Hospital Course:  The patient was admitted and underwent CRTP generator change and system revision with details as outlined above. She was monitored on telemetry overnight and kept in the hospital for IV pain medication control. Overnight, she has had sinus rhythm with ventricular pacing.  Her left chest was without hematoma or ecchymosis.  She was seen by Dr Ladona Ridgel and considered stable for discharge to home.  Wound care and restrictions were reviewed with the patient.  Routine follow up  scheduled.   Discharge Vitals: Blood pressure 129/54, pulse 80, temperature 98.4 F (36.9 C), temperature source Oral, resp. rate 18, height 5' 4.5" (1.638 m), weight 156 lb 1.4 oz (70.8 kg), SpO2 95.00%.   Labs:   Lab Results  Component Value Date   WBC 8.1 03/01/2014   HGB 11.7* 03/01/2014   HCT 35.4* 03/01/2014   MCV 101.8 Repeated and verified X2.* 03/01/2014   PLT 159.0 03/01/2014   No results found for this basename: NA, K, CL, CO2, BUN, CREATININE, CALCIUM, LABALBU, PROT, BILITOT, ALKPHOS, ALT, AST, GLUCOSE,  in the last 168 hours   Discharge Medications:    Medication List    ASK your doctor about these medications       aspirin 325 MG EC tablet  Take 325 mg by mouth every 6 (six) hours as needed for pain.     buPROPion 150 MG 24 hr tablet  Commonly known as:  WELLBUTRIN XL  Take 150 mg by mouth daily.     carvedilol 12.5 MG tablet  Commonly known as:  COREG  Take 1 tablet (12.5 mg total) by mouth 2 (two) times daily.     cholecalciferol 1000 UNITS tablet  Commonly known as:  VITAMIN D  Take 1,000 Units by mouth daily.     colchicine 0.6 MG tablet  Take 0.6 mg by mouth daily as needed. (for Gout flare-ups)     multivitamin with minerals Tabs tablet  Take 1 tablet by mouth daily.     traMADol 50 MG tablet  Commonly known as:  ULTRAM  Take 50 mg by mouth every 6 (  six) hours as needed for moderate pain or severe pain.     TRAVATAN Z 0.004 % Soln ophthalmic solution  Generic drug:  Travoprost (BAK Free)  Place 1 drop into both eyes at bedtime.     VITAMIN K2 PO  Take 1 tablet by mouth daily.        Disposition:     Duration of Discharge Encounter: Greater than 30 minutes including physician time.  Signed,  Leonia ReevesGregg Taylor,M.D.

## 2014-03-18 ENCOUNTER — Ambulatory Visit: Payer: Medicare Other

## 2014-03-18 ENCOUNTER — Ambulatory Visit (INDEPENDENT_AMBULATORY_CARE_PROVIDER_SITE_OTHER): Payer: Medicare Other | Admitting: *Deleted

## 2014-03-18 DIAGNOSIS — I442 Atrioventricular block, complete: Secondary | ICD-10-CM

## 2014-03-18 LAB — MDC_IDC_ENUM_SESS_TYPE_INCLINIC
Battery Voltage: 3.07 V
Brady Statistic AP VP Percent: 18.34 %
Brady Statistic AS VP Percent: 81.54 %
Brady Statistic RA Percent Paced: 18.37 %
Brady Statistic RV Percent Paced: 99.88 %
Lead Channel Impedance Value: 323 Ohm
Lead Channel Impedance Value: 342 Ohm
Lead Channel Impedance Value: 4047 Ohm
Lead Channel Impedance Value: 4047 Ohm
Lead Channel Impedance Value: 456 Ohm
Lead Channel Pacing Threshold Amplitude: 1.25 V
Lead Channel Pacing Threshold Amplitude: 1.5 V
Lead Channel Sensing Intrinsic Amplitude: 0.5 mV
Lead Channel Sensing Intrinsic Amplitude: 27.5 mV
Lead Channel Setting Pacing Amplitude: 2.75 V
Lead Channel Setting Pacing Amplitude: 2.75 V
Lead Channel Setting Pacing Pulse Width: 0.4 ms
Lead Channel Setting Sensing Sensitivity: 5.6 mV
MDC IDC MSMT LEADCHNL LV IMPEDANCE VALUE: 4047 Ohm
MDC IDC MSMT LEADCHNL LV IMPEDANCE VALUE: 418 Ohm
MDC IDC MSMT LEADCHNL LV PACING THRESHOLD PULSEWIDTH: 0.8 ms
MDC IDC MSMT LEADCHNL RA IMPEDANCE VALUE: 399 Ohm
MDC IDC MSMT LEADCHNL RA SENSING INTR AMPL: 0.875 mV
MDC IDC MSMT LEADCHNL RV IMPEDANCE VALUE: 399 Ohm
MDC IDC MSMT LEADCHNL RV PACING THRESHOLD PULSEWIDTH: 0.4 ms
MDC IDC SESS DTM: 20151029170703
MDC IDC SET LEADCHNL LV PACING AMPLITUDE: 2.5 V
MDC IDC SET LEADCHNL LV PACING PULSEWIDTH: 0.8 ms
MDC IDC STAT BRADY AP VS PERCENT: 0.03 %
MDC IDC STAT BRADY AS VS PERCENT: 0.09 %
Zone Setting Detection Interval: 350 ms
Zone Setting Detection Interval: 400 ms

## 2014-03-18 NOTE — Progress Notes (Signed)
Wound check appointment. Steri-strips removed. Wound without redness or edema. Incision edges approximated, wound well healed. Normal device function. Thresholds, sensing, and impedances consistent with implant measurements. Device programmed at 3.5V/auto capture programmed on for extra safety margin until 3 month visit. Histogram distribution appropriate for patient and level of activity. No mode switches or high ventricular rates noted. Patient educated about wound care, arm mobility, lifting restrictions. ROV in 3 months with implanting physician.  ROV in January with Dr. Ladona Ridgel.

## 2014-03-22 ENCOUNTER — Telehealth: Payer: Self-pay | Admitting: Internal Medicine

## 2014-03-22 NOTE — Telephone Encounter (Signed)
New message    For Tiffany [name] [id number]---number off [employer] device

## 2014-03-22 NOTE — Telephone Encounter (Signed)
Information sent to Jana Half with [employer]

## 2014-04-01 ENCOUNTER — Telehealth: Payer: Self-pay | Admitting: Internal Medicine

## 2014-04-01 NOTE — Telephone Encounter (Signed)
New message     Pt had a pacemaker put in in oct.  She says she has a clear substance coming out of the area where her pacemaker was put in.  It is not blood.  Please advise

## 2014-04-01 NOTE — Telephone Encounter (Signed)
Pt scheduled for 04-02-14 at 1600 for wound check.

## 2014-04-02 ENCOUNTER — Ambulatory Visit (INDEPENDENT_AMBULATORY_CARE_PROVIDER_SITE_OTHER): Payer: Medicare Other | Admitting: *Deleted

## 2014-04-02 DIAGNOSIS — I442 Atrioventricular block, complete: Secondary | ICD-10-CM

## 2014-04-02 MED ORDER — CEPHALEXIN 500 MG PO CAPS: 500.0000 mg | ORAL_CAPSULE | Freq: Two times a day (BID) | ORAL | Status: DC

## 2014-04-02 NOTE — Patient Instructions (Signed)
Your physician recommends that you schedule a follow-up appointment in: 04/08/14 in device clinic

## 2014-04-06 ENCOUNTER — Ambulatory Visit: Payer: Medicare Other

## 2014-04-06 NOTE — Progress Notes (Signed)
Pt presents to device clinic with "pinhole" over device leaking fluid.  Pocket without normal redness or edema according to patient. Small opening over device with serosanguinous drainage. Pain with palpaion above device.  Dr. Ladona Ridgel in room to see patient- he expressed large amount of serosanguinous fluid from pocket.  Wound covered with gauze and paper tape as directed by GT. Plan for patient to return to be seen in device clinic 04/08/14 at 4:00pm to reassess wound. Pt prescribed keflex 500mg  bid for 7 days.

## 2014-04-08 ENCOUNTER — Ambulatory Visit: Payer: Medicare Other | Admitting: *Deleted

## 2014-04-08 ENCOUNTER — Telehealth: Payer: Self-pay | Admitting: Internal Medicine

## 2014-04-08 DIAGNOSIS — I442 Atrioventricular block, complete: Secondary | ICD-10-CM

## 2014-04-08 MED ORDER — CEPHALEXIN 500 MG PO CAPS: 500.0000 mg | ORAL_CAPSULE | Freq: Two times a day (BID) | ORAL | Status: DC

## 2014-04-08 NOTE — Telephone Encounter (Signed)
New Message        Pt calling stating she

## 2014-04-08 NOTE — Progress Notes (Signed)
Ms. Tiffany [name] presents to device clinic 1 week after device pocket expressed by GT. Pt continues taking Keflex 500mg  po bid. Pocket without redness, edema or drainage, no pain upon palpation.  Dr. Ladona Ridgel recommends continuing keflex 500mg  po bid for 2 more weeks and seeing pt back in device clinic Dec 3 at 4pm. Rx sent to [facility] on [address].

## 2014-04-13 ENCOUNTER — Encounter: Payer: Self-pay | Admitting: Internal Medicine

## 2014-04-29 ENCOUNTER — Encounter (HOSPITAL_COMMUNITY): Payer: Self-pay | Admitting: Internal Medicine

## 2014-05-05 ENCOUNTER — Other Ambulatory Visit: Payer: Self-pay | Admitting: Internal Medicine

## 2014-05-10 ENCOUNTER — Ambulatory Visit (INDEPENDENT_AMBULATORY_CARE_PROVIDER_SITE_OTHER): Payer: Medicare Other | Admitting: *Deleted

## 2014-05-10 DIAGNOSIS — I442 Atrioventricular block, complete: Secondary | ICD-10-CM

## 2014-05-10 NOTE — Progress Notes (Signed)
The patient continues on her Keflex 500mg  bid.  The site continues to be discolored but is not draining at this time.  Per Dr. Johney Frame,  ROV 12/29 with Dr. Ladona Ridgel to evaluate.

## 2014-05-18 ENCOUNTER — Encounter: Payer: Medicare Other | Admitting: Internal Medicine

## 2014-05-25 ENCOUNTER — Other Ambulatory Visit: Payer: Self-pay | Admitting: Internal Medicine

## 2014-05-26 NOTE — Telephone Encounter (Signed)
Patient requesting refill on Cephalexin. Please advise.

## 2014-05-27 ENCOUNTER — Telehealth: Payer: Self-pay | Admitting: Internal Medicine

## 2014-05-27 NOTE — Telephone Encounter (Signed)
Patient informed of PCP instructions. 

## 2014-05-27 NOTE — Telephone Encounter (Signed)
Very sorry, but this medication is outside of my scope of practice, and I would not feel comfortable prescribing it

## 2014-05-27 NOTE — Telephone Encounter (Signed)
Patient is asking for a refill of TRAVATAN Z 0.004 % SOLN ophthalmic solution. Her opthamologist will no longer fill this RX.

## 2014-06-08 ENCOUNTER — Encounter: Payer: Self-pay | Admitting: *Deleted

## 2014-06-08 ENCOUNTER — Ambulatory Visit (INDEPENDENT_AMBULATORY_CARE_PROVIDER_SITE_OTHER): Payer: Medicare Other | Admitting: Internal Medicine

## 2014-06-08 ENCOUNTER — Encounter: Payer: Self-pay | Admitting: Internal Medicine

## 2014-06-08 DIAGNOSIS — I442 Atrioventricular block, complete: Secondary | ICD-10-CM

## 2014-06-08 NOTE — Assessment & Plan Note (Signed)
Her symptoms are actually class 2. She has improved since her surgery.

## 2014-06-08 NOTE — Progress Notes (Signed)
    HPI Tiffany [name] returns today for followup. She is a pleasant elderly woman with a h/o complete heart block, chronic systolic heart failure and multiple lead issues. Her pacing system was initially placed in 1996, with upgrade to a dual-chamber ICD, followed by a biventricular pacemaker. Her pacing/sense portion of her ICD lead has failed. She has an elevated left ventricular pacing threshold. Her right atrial lead does not pace but does sense. With all this she underwent pacemaker revision. She has developed thinning of the skin around the lateral aspect of her incision. She denies fever or chills. No drainage from the device She has not had syncope.  No Known Allergies   Current Outpatient Prescriptions  Medication Sig Dispense Refill  . aspirin 325 MG EC tablet Take 325 mg by mouth every 6 (six) hours as needed for pain.     . carvedilol (COREG) 12.5 MG tablet Take 1 tablet (12.5 mg total) by mouth 2 (two) times daily. 180 tablet 3  . cephALEXin (KEFLEX) 500 MG capsule take 1 capsule by mouth twice a day 28 capsule 0  . cholecalciferol (VITAMIN D) 1000 UNITS tablet Take 1,000 Units by mouth daily.    . colchicine 0.6 MG tablet Take 0.6 mg by mouth daily as needed. (for Gout flare-ups)    . Menaquinone-7 (VITAMIN K2 PO) Take 1 tablet by mouth daily.    . Multiple Vitamin (MULTIVITAMIN WITH MINERALS) TABS tablet Take 1 tablet by mouth daily.    . traMADol (ULTRAM) 50 MG tablet Take 50 mg by mouth every 6 (six) hours as needed for moderate pain or severe pain.    . TRAVATAN Z 0.004 % SOLN ophthalmic solution Place 1 drop into both eyes at bedtime.     No current facility-administered medications for this visit.     Past Medical History  Diagnosis Date  . Status post biventricular pacemaker     with AICD; 2004; revision 2007  . Complete heart block   . Nonischemic cardiomyopathy     EF 45-50% in 2009  . Systolic heart failure   . Macular degeneration   . Ventricular tachycardia     . Arthritis   . Glaucoma   . Hyperlipidemia   . Essential hypertension, benign   . Hypothyroidism   . Osteoporosis   . Allergy   . Impaired glucose tolerance 02/02/2011  . Vitamin D deficiency 02/02/2011  . Hearing loss 02/02/2011    ROS:   All systems reviewed and negative except as noted in the HPI.   Past Surgical History  Procedure Laterality Date  . Bi-ventricular pacemaker revision (crt-r)  03-08-2014    MDT CRTP generator change with use of previously abandoned RV lead due to ICD RV lead failure  . Cholecystectomy    . Appendectomy    . Tonsillectomy    . Cataract surgery    . Bi-ventricular pacemaker revision N/A 03/08/2014    Procedure: BI-VENTRICULAR PACEMAKER REVISION (CRT-R);  Surgeon: Audreyana Huntsberry W Refael Fulop, MD;  Location: [facility] CATH LAB;  Service: Cardiovascular;  Laterality: N/A;     Family History  Problem Relation Age of Onset  . Hypertension Mother   . Coronary artery disease Mother   . Arthritis Other   . Cancer Other     lung cancer  . Heart disease Other   . Hypertension Other      History   Social History  . Marital Status: Widowed    Spouse Name: N/A    Number of   Children: N/A  . Years of Education: N/A   Occupational History  . retired    Social History Main Topics  . Smoking status: Never Smoker   . Smokeless tobacco: Never Used  . Alcohol Use: No  . Drug Use: No  . Sexual Activity: Not on file   Other Topics Concern  . Not on file   Social History Narrative     BP 170/74 mmHg  Pulse 86  Wt 160 lb 9.6 oz (72.848 kg)  Physical Exam:  stable appearing 81-year-old woman, NAD HEENT: Unremarkable Neck:  No JVD, no thyromegally Back:  No CVA tenderness Lungs:  Clear with no wheezes, rales, or rhonchi. PPM pocket with paper thin skin over the lateral aspect of her incision. HEART:  Regular rate rhythm, no murmurs, no rubs, no clicks Abd:  soft, positive bowel sounds, no organomegally, no rebound, no guarding Ext:  2 plus pulses, no  edema, no cyanosis, no clubbing Skin:  No rashes no nodules Neuro:  CN II through XII intact, motor grossly intact   DEVICE  The patient refused device interogation  Assess/Plan: 

## 2014-06-08 NOTE — Patient Instructions (Addendum)
See instruction sheet for procedure  Your physician recommends that you return for lab work today: BMP/CBC  Your physician recommends that you schedule a follow-up appointment in: 7-10 days from 06/16/14 in device clinic for wound check

## 2014-06-08 NOTE — Assessment & Plan Note (Signed)
Her blood pressure is elevated. She is encouraged to maintain a low sodium diet.  

## 2014-06-08 NOTE — Assessment & Plan Note (Signed)
I am concerned about pending pocket erosion and have recommended a salvage procedure to try and save the implant. I plan to attempt to bury the generator and leads under pectoralis major if possible or move the can medially depending on how the pocket looks.  She is a very poor candidate for extraction.

## 2014-06-09 LAB — CBC WITH DIFFERENTIAL/PLATELET
BASOS ABS: 0 10*3/uL (ref 0.0–0.1)
BASOS PCT: 0.6 % (ref 0.0–3.0)
EOS ABS: 0.3 10*3/uL (ref 0.0–0.7)
Eosinophils Relative: 3.1 % (ref 0.0–5.0)
HCT: 38.5 % (ref 36.0–46.0)
HEMOGLOBIN: 12.9 g/dL (ref 12.0–15.0)
Lymphocytes Relative: 35.4 % (ref 12.0–46.0)
Lymphs Abs: 2.9 10*3/uL (ref 0.7–4.0)
MCHC: 33.5 g/dL (ref 30.0–36.0)
MCV: 100.1 fl — ABNORMAL HIGH (ref 78.0–100.0)
Monocytes Absolute: 0.5 10*3/uL (ref 0.1–1.0)
Monocytes Relative: 6.5 % (ref 3.0–12.0)
NEUTROS ABS: 4.5 10*3/uL (ref 1.4–7.7)
NEUTROS PCT: 54.4 % (ref 43.0–77.0)
Platelets: 177 10*3/uL (ref 150.0–400.0)
RBC: 3.85 Mil/uL — ABNORMAL LOW (ref 3.87–5.11)
RDW: 13.5 % (ref 11.5–15.5)
WBC: 8.3 10*3/uL (ref 4.0–10.5)

## 2014-06-09 LAB — BASIC METABOLIC PANEL
BUN: 28 mg/dL — ABNORMAL HIGH (ref 6–23)
CHLORIDE: 104 meq/L (ref 96–112)
CO2: 29 mEq/L (ref 19–32)
Calcium: 9.9 mg/dL (ref 8.4–10.5)
Creatinine, Ser: 1.23 mg/dL — ABNORMAL HIGH (ref 0.40–1.20)
GFR: 43.61 mL/min — ABNORMAL LOW (ref >60.00)
Glucose, Bld: 98 mg/dL (ref 70–99)
Potassium: 4 mEq/L (ref 3.5–5.1)
SODIUM: 138 meq/L (ref 135–145)

## 2014-06-15 DIAGNOSIS — Z45018 Encounter for adjustment and management of other part of cardiac pacemaker: Secondary | ICD-10-CM | POA: Diagnosis not present

## 2014-06-15 DIAGNOSIS — I429 Cardiomyopathy, unspecified: Secondary | ICD-10-CM | POA: Diagnosis not present

## 2014-06-15 DIAGNOSIS — I10 Essential (primary) hypertension: Secondary | ICD-10-CM | POA: Diagnosis not present

## 2014-06-15 DIAGNOSIS — Z7982 Long term (current) use of aspirin: Secondary | ICD-10-CM | POA: Diagnosis not present

## 2014-06-15 DIAGNOSIS — E039 Hypothyroidism, unspecified: Secondary | ICD-10-CM | POA: Diagnosis not present

## 2014-06-15 DIAGNOSIS — I442 Atrioventricular block, complete: Secondary | ICD-10-CM | POA: Diagnosis present

## 2014-06-15 DIAGNOSIS — E785 Hyperlipidemia, unspecified: Secondary | ICD-10-CM | POA: Diagnosis not present

## 2014-06-15 DIAGNOSIS — M810 Age-related osteoporosis without current pathological fracture: Secondary | ICD-10-CM | POA: Diagnosis not present

## 2014-06-15 DIAGNOSIS — I5022 Chronic systolic (congestive) heart failure: Secondary | ICD-10-CM | POA: Diagnosis not present

## 2014-06-15 MED ORDER — CEFAZOLIN SODIUM-DEXTROSE 2-3 GM-% IV SOLR: 2.0000 g | INTRAVENOUS | Status: AC

## 2014-06-15 MED ORDER — SODIUM CHLORIDE 0.9 % IJ SOLN: 3.0000 mL | INTRAMUSCULAR | Status: DC | PRN

## 2014-06-15 MED ORDER — MUPIROCIN 2 % EX OINT
1.0000 "application " | TOPICAL_OINTMENT | Freq: Once | CUTANEOUS | Status: AC
  Administered 2014-06-16: 1 via TOPICAL
  Filled 2014-06-15: qty 22

## 2014-06-15 MED ORDER — SODIUM CHLORIDE 0.9 % IV SOLN: 250.0000 mL | INTRAVENOUS | Status: DC

## 2014-06-15 MED ORDER — SODIUM CHLORIDE 0.9 % IJ SOLN
3.0000 mL | Freq: Two times a day (BID) | INTRAMUSCULAR | Status: DC
  Administered 2014-06-16: 3 mL via INTRAVENOUS

## 2014-06-15 MED ORDER — SODIUM CHLORIDE 0.9 % IV SOLN
INTRAVENOUS | Status: DC
  Administered 2014-06-16: 11:00:00 via INTRAVENOUS

## 2014-06-15 MED ORDER — SODIUM CHLORIDE 0.9 % IR SOLN
80.0000 mg | Status: AC
  Filled 2014-06-15: qty 2

## 2014-06-16 ENCOUNTER — Ambulatory Visit (HOSPITAL_COMMUNITY)
Admission: RE | Admit: 2014-06-16 | Discharge: 2014-06-16 | Disposition: A | Discharge status: Home / Self Care | Payer: Medicare Other | Source: Ambulatory Visit | Attending: Internal Medicine | Admitting: Internal Medicine

## 2014-06-16 ENCOUNTER — Encounter (HOSPITAL_COMMUNITY)
Admission: RE | Disposition: A | Discharge status: Home / Self Care | Payer: Self-pay | Source: Ambulatory Visit | Attending: Internal Medicine

## 2014-06-16 ENCOUNTER — Encounter (HOSPITAL_COMMUNITY): Payer: Self-pay | Admitting: Internal Medicine

## 2014-06-16 DIAGNOSIS — Z7982 Long term (current) use of aspirin: Secondary | ICD-10-CM | POA: Insufficient documentation

## 2014-06-16 DIAGNOSIS — E039 Hypothyroidism, unspecified: Secondary | ICD-10-CM | POA: Insufficient documentation

## 2014-06-16 DIAGNOSIS — I5022 Chronic systolic (congestive) heart failure: Secondary | ICD-10-CM | POA: Insufficient documentation

## 2014-06-16 DIAGNOSIS — E785 Hyperlipidemia, unspecified: Secondary | ICD-10-CM | POA: Insufficient documentation

## 2014-06-16 DIAGNOSIS — I429 Cardiomyopathy, unspecified: Secondary | ICD-10-CM | POA: Insufficient documentation

## 2014-06-16 DIAGNOSIS — Z45018 Encounter for adjustment and management of other part of cardiac pacemaker: Secondary | ICD-10-CM | POA: Insufficient documentation

## 2014-06-16 DIAGNOSIS — I442 Atrioventricular block, complete: Secondary | ICD-10-CM

## 2014-06-16 DIAGNOSIS — I10 Essential (primary) hypertension: Secondary | ICD-10-CM | POA: Insufficient documentation

## 2014-06-16 DIAGNOSIS — M810 Age-related osteoporosis without current pathological fracture: Secondary | ICD-10-CM | POA: Insufficient documentation

## 2014-06-16 HISTORY — PX: BI-VENTRICULAR PACEMAKER REVISION: SHX5463

## 2014-06-16 LAB — SURGICAL PCR SCREEN
MRSA, PCR: NEGATIVE
Staphylococcus aureus: NEGATIVE

## 2014-06-16 LAB — GLUCOSE, CAPILLARY: GLUCOSE-CAPILLARY: 123 mg/dL — AB (ref 70–99)

## 2014-06-16 SURGERY — BI-VENTRICULAR PACEMAKER REVISION (CRT-R)

## 2014-06-16 MED ORDER — FENTANYL CITRATE 0.05 MG/ML IJ SOLN
INTRAMUSCULAR | Status: AC
  Filled 2014-06-16: qty 2

## 2014-06-16 MED ORDER — MUPIROCIN 2 % EX OINT
TOPICAL_OINTMENT | CUTANEOUS | Status: AC
  Administered 2014-06-16: 1 via TOPICAL
  Filled 2014-06-16: qty 22

## 2014-06-16 MED ORDER — FENTANYL CITRATE 0.05 MG/ML IJ SOLN: 25.0000 ug | INTRAMUSCULAR | Status: DC | PRN

## 2014-06-16 MED ORDER — LIDOCAINE HCL (PF) 1 % IJ SOLN
INTRAMUSCULAR | Status: AC
  Filled 2014-06-16: qty 60

## 2014-06-16 MED ORDER — MIDAZOLAM HCL 5 MG/5ML IJ SOLN
INTRAMUSCULAR | Status: AC
  Filled 2014-06-16: qty 5

## 2014-06-16 MED ORDER — ONDANSETRON HCL 4 MG/2ML IJ SOLN: 4.0000 mg | Freq: Four times a day (QID) | INTRAMUSCULAR | Status: DC | PRN

## 2014-06-16 MED ORDER — ACETAMINOPHEN 325 MG PO TABS
325.0000 mg | ORAL_TABLET | ORAL | Status: DC | PRN
Start: 2014-06-16 — End: 2014-06-16

## 2014-06-16 NOTE — Interval H&P Note (Signed)
History and Physical Interval Note:  06/16/2014 1:28 PM  Tiffany [name]  has presented today for surgery, with the diagnosis of hb  The various methods of treatment have been discussed with the patient and family. After consideration of risks, benefits and other options for treatment, the patient has consented to  Procedure(s): BI-VENTRICULAR PACEMAKER REVISION (CRT-R) (N/A) as a surgical intervention .  The patient's history has been reviewed, patient examined, no change in status, stable for surgery.  I have reviewed the patient's chart and labs.  Questions were answered to the patient's satisfaction.     [name].D.

## 2014-06-16 NOTE — Discharge Instructions (Signed)
Pacemaker Battery Change, Care After °Refer to this sheet in the next few weeks. These instructions provide you with information on caring for yourself after your procedure. Your health care provider may also give you more specific instructions. Your treatment has been planned according to current medical practices, but problems sometimes occur. Call your health care provider if you have any problems or questions after your procedure. °WHAT TO EXPECT AFTER THE PROCEDURE °After your procedure, it is typical to have the following sensations: °· Soreness at the pacemaker site. °HOME CARE INSTRUCTIONS  °· Keep the incision clean and dry. °· Unless advised otherwise, you may shower beginning 48 hours after your procedure. °· For the first week after the replacement, avoid stretching motions that pull at the incision site, and avoid heavy exercise with the arm that is on the same side as the incision. °· Take medicines only as directed by your health care provider. °· Keep all follow-up visits as directed by your health care provider. °SEEK MEDICAL CARE IF:  °· You have pain at the incision site that is not relieved by over-the-counter or prescription medicine. °· There is drainage or pus from the incision site. °· There is swelling larger than a lime at the incision site. °· You develop red streaking that extends above or below the incision site. °· You feel brief, intermittent palpitations, light-headedness, or any symptoms that you feel might be related to your heart. °SEEK IMMEDIATE MEDICAL CARE IF:  °· You experience chest pain that is different than the pain at the pacemaker site. °· You experience shortness of breath. °· You have palpitations or irregular heartbeat. °· You have light-headedness that does not go away quickly. °· You faint. °· You have pain that gets worse and is not relieved by medicine. °Document Released: 02/25/2013 Document Revised: 09/21/2013 Document Reviewed: 02/25/2013 °Exit[employer]® Patient  Information ©2015 [employer]. This information is not intended to replace advice given to you by your health care provider. Make sure you discuss any questions you have with your health care provider. ° °

## 2014-06-16 NOTE — H&P (View-Only) (Signed)
HPI Tiffany [name] returns today for followup. She is a pleasant elderly woman with a h/o complete heart block, chronic systolic heart failure and multiple lead issues. Her pacing system was initially placed in 1996, with upgrade to a dual-chamber ICD, followed by a biventricular pacemaker. Her pacing/sense portion of her ICD lead has failed. She has an elevated left ventricular pacing threshold. Her right atrial lead does not pace but does sense. With all this she underwent pacemaker revision. She has developed thinning of the skin around the lateral aspect of her incision. She denies fever or chills. No drainage from the device She has not had syncope.  No Known Allergies   Current Outpatient Prescriptions  Medication Sig Dispense Refill  . aspirin 325 MG EC tablet Take 325 mg by mouth every 6 (six) hours as needed for pain.     . carvedilol (COREG) 12.5 MG tablet Take 1 tablet (12.5 mg total) by mouth 2 (two) times daily. 180 tablet 3  . cephALEXin (KEFLEX) 500 MG capsule take 1 capsule by mouth twice a day 28 capsule 0  . cholecalciferol (VITAMIN D) 1000 UNITS tablet Take 1,000 Units by mouth daily.    . colchicine 0.6 MG tablet Take 0.6 mg by mouth daily as needed. (for Gout flare-ups)    . Menaquinone-7 (VITAMIN K2 PO) Take 1 tablet by mouth daily.    . Multiple Vitamin (MULTIVITAMIN WITH MINERALS) TABS tablet Take 1 tablet by mouth daily.    . traMADol (ULTRAM) 50 MG tablet Take 50 mg by mouth every 6 (six) hours as needed for moderate pain or severe pain.    . TRAVATAN Z 0.004 % SOLN ophthalmic solution Place 1 drop into both eyes at bedtime.     No current facility-administered medications for this visit.     Past Medical History  Diagnosis Date  . Status post biventricular pacemaker     with AICD; 2004; revision 2007  . Complete heart block   . Nonischemic cardiomyopathy     EF 45-50% in 2009  . Systolic heart failure   . Macular degeneration   . Ventricular tachycardia     . Arthritis   . Glaucoma   . Hyperlipidemia   . Essential hypertension, benign   . Hypothyroidism   . Osteoporosis   . Allergy   . Impaired glucose tolerance 02/02/2011  . Vitamin D deficiency 02/02/2011  . Hearing loss 02/02/2011    ROS:   All systems reviewed and negative except as noted in the HPI.   Past Surgical History  Procedure Laterality Date  . Bi-ventricular pacemaker revision (crt-r)  03-08-2014    MDT CRTP generator change with use of previously abandoned RV lead due to ICD RV lead failure  . Cholecystectomy    . Appendectomy    . Tonsillectomy    . Cataract surgery    . Bi-ventricular pacemaker revision N/A 03/08/2014    Procedure: BI-VENTRICULAR PACEMAKER REVISION (CRT-R);  Surgeon: Marinus Maw, MD;  Location: [facility] CATH LAB;  Service: Cardiovascular;  Laterality: N/A;     Family History  Problem Relation Age of Onset  . Hypertension Mother   . Coronary artery disease Mother   . Arthritis Other   . Cancer Other     lung cancer  . Heart disease Other   . Hypertension Other      History   Social History  . Marital Status: Widowed    Spouse Name: N/A    Number of  Children: N/A  . Years of Education: N/A   Occupational History  . retired    Social History Main Topics  . Smoking status: Never Smoker   . Smokeless tobacco: Never Used  . Alcohol Use: No  . Drug Use: No  . Sexual Activity: Not on file   Other Topics Concern  . Not on file   Social History Narrative     BP 170/74 mmHg  Pulse 86  Wt 160 lb 9.6 oz (72.848 kg)  Physical Exam:  stable appearing 81 year old woman, NAD HEENT: Unremarkable Neck:  No JVD, no thyromegally Back:  No CVA tenderness Lungs:  Clear with no wheezes, rales, or rhonchi. PPM pocket with paper thin skin over the lateral aspect of her incision. HEART:  Regular rate rhythm, no murmurs, no rubs, no clicks Abd:  soft, positive bowel sounds, no organomegally, no rebound, no guarding Ext:  2 plus pulses, no  edema, no cyanosis, no clubbing Skin:  No rashes no nodules Neuro:  CN II through XII intact, motor grossly intact   DEVICE  The patient refused device interogation  Assess/Plan:

## 2014-06-16 NOTE — CV Procedure (Signed)
EP Procedure Note  Procedure Performed: PPM pocket revision  Indication: Near pocket erosion  Preoperative Diagnosis: chronic systolic heart failure and complete heart block, s/p PPM with recent revision with pending pocket erosion  Description of the Procedure: after informed consent was obtained, the patient was taken to the EP lab in the fasting state. After the usual preparatio and draping, IV versed and fentanyl were used for sedation. 40 cc of lidocaine was infiltrated into the right infraclavicular region. A 6 cm incision was carried out and the near eroded skin was widely excised. Electrocautery was used to free up the dense fibrous scar tissue. A subcutaneous flap was fashion medially and caudally. The leads were brought up through the submuscular flap and the device was reconnected to the leads in the new pocket. An anti-biotic pouch was used to envelope the leads and generator. The pocket was irrigated and cautery was used to achieve hemostasis. The incision was closed with 3 layers of vicryl suture. Benzoin and steristrips and a pressure dressing was applied and the patient returned to her room in satisfactory condition.   Complications: none immediately  Conclusion: successful PM pocket revision in a patient with a pending pocket erosion.   [name].D.

## 2014-06-22 ENCOUNTER — Other Ambulatory Visit: Payer: Self-pay | Admitting: Internal Medicine

## 2014-06-24 ENCOUNTER — Ambulatory Visit: Payer: Medicare Other | Admitting: *Deleted

## 2014-06-24 ENCOUNTER — Telehealth: Payer: Self-pay | Admitting: Internal Medicine

## 2014-06-24 ENCOUNTER — Encounter: Payer: Medicare Other | Admitting: Internal Medicine

## 2014-06-24 NOTE — Telephone Encounter (Signed)
Pt agreed to appt today at 2:30 PM

## 2014-06-24 NOTE — Telephone Encounter (Signed)
Pt recently had her pacemaker replaced.  . 1. Has your device fired? No  2. Is you device beeping? No  3. Are you experiencing draining or swelling at device site? Yes along with redness and soreness  4. Are you calling to see if we received your device transmission? No  5. Have you passed out? No

## 2014-06-29 ENCOUNTER — Encounter: Payer: Self-pay | Admitting: Internal Medicine

## 2014-06-29 ENCOUNTER — Ambulatory Visit (INDEPENDENT_AMBULATORY_CARE_PROVIDER_SITE_OTHER): Payer: Medicare Other | Admitting: Internal Medicine

## 2014-06-29 VITALS — BP 140/80 | HR 70 | Ht 66.0 in | Wt 156.0 lb

## 2014-06-29 DIAGNOSIS — Z950 Presence of cardiac pacemaker: Secondary | ICD-10-CM

## 2014-06-29 DIAGNOSIS — I10 Essential (primary) hypertension: Secondary | ICD-10-CM

## 2014-06-29 DIAGNOSIS — I472 Ventricular tachycardia, unspecified: Secondary | ICD-10-CM

## 2014-06-29 NOTE — Assessment & Plan Note (Signed)
Her blood pressure is minimally elevated. Will follow. 

## 2014-06-29 NOTE — Assessment & Plan Note (Signed)
She has had no ventricular arrhythmias. No change in meds.

## 2014-06-29 NOTE — Assessment & Plan Note (Signed)
Her pocket hematoma is quite large. I have offered her a pocket hematoma evacuation but she refuses. Hopefully the blood under her skin will resorb itself.

## 2014-06-29 NOTE — Progress Notes (Signed)
HPI Tiffany [name] returns today for followup. She is a pleasant elderly woman with a h/o complete heart block, chronic systolic heart failure and multiple lead issues. Her pacing system was initially placed in 1996, with upgrade to a dual-chamber ICD, followed by a biventricular pacemaker. Her pacing/sense portion of her ICD lead has failed. She has an elevated left ventricular pacing threshold. Her right atrial lead does not pace but does sense. With all this she underwent pacemaker revision but after 2 months developed thinning of the skin and pending erosion and underwent a salvage procedure 2 weeks ago which was complicated by PM hematoma formation for which she has undergone placement of a pressure dressing. She has had no drainage. No fever or chills. She is very sore. No Known Allergies   Current Outpatient Prescriptions  Medication Sig Dispense Refill  . aspirin 325 MG EC tablet Take 325 mg by mouth every 6 (six) hours as needed for pain.     . carvedilol (COREG) 12.5 MG tablet Take 1 tablet (12.5 mg total) by mouth 2 (two) times daily. 180 tablet 3  . cephALEXin (KEFLEX) 500 MG capsule take 1 capsule by mouth twice a day 28 capsule 0  . cholecalciferol (VITAMIN D) 1000 UNITS tablet Take 1,000 Units by mouth daily.    . colchicine 0.6 MG tablet Take 0.6 mg by mouth daily as needed. (for Gout flare-ups)    . Menaquinone-7 (VITAMIN K2 PO) Take 1 tablet by mouth daily.    . Potassium 99 MG TABS Take 99 mg by mouth daily.    . Probiotic Product (NATRUL PROBIOTIC PO) Take 325 mg by mouth 2 (two) times daily.    . traMADol (ULTRAM) 50 MG tablet Take 50 mg by mouth every 6 (six) hours as needed for moderate pain or severe pain.    . TRAVATAN Z 0.004 % SOLN ophthalmic solution Place 1 drop into both eyes at bedtime.     No current facility-administered medications for this visit.     Past Medical History  Diagnosis Date  . Status post biventricular pacemaker     with AICD; 2004;  revision 2007  . Complete heart block   . Nonischemic cardiomyopathy     EF 45-50% in 2009  . Systolic heart failure   . Macular degeneration   . Ventricular tachycardia   . Arthritis   . Glaucoma   . Hyperlipidemia   . Essential hypertension, benign   . Hypothyroidism   . Osteoporosis   . Allergy   . Impaired glucose tolerance 02/02/2011  . Vitamin D deficiency 02/02/2011  . Hearing loss 02/02/2011    ROS:   All systems reviewed and negative except as noted in the HPI.   Past Surgical History  Procedure Laterality Date  . Bi-ventricular pacemaker revision (crt-r)  03-08-2014    MDT CRTP generator change with use of previously abandoned RV lead due to ICD RV lead failure  . Cholecystectomy    . Appendectomy    . Tonsillectomy    . Cataract surgery    . Bi-ventricular pacemaker revision N/A 03/08/2014    Procedure: BI-VENTRICULAR PACEMAKER REVISION (CRT-R);  Surgeon: Marinus Maw, MD;  Location: [facility] CATH LAB;  Service: Cardiovascular;  Laterality: N/A;  . Bi-ventricular pacemaker revision N/A 06/16/2014    Procedure: BI-VENTRICULAR PACEMAKER REVISION (CRT-R);  Surgeon: Marinus Maw, MD;  Location[facility] CATH LAB;  Service: Cardiovascular;  Laterality: N/A;     Family History  Problem Relation  Age of Onset  . Hypertension Mother   . Coronary artery disease Mother   . Arthritis Other   . Cancer Other     lung cancer  . Heart disease Other   . Hypertension Other      History   Social History  . Marital Status: Widowed    Spouse Name: N/A    Number of Children: N/A  . Years of Education: N/A   Occupational History  . retired    Social History Main Topics  . Smoking status: Never Smoker   . Smokeless tobacco: Never Used  . Alcohol Use: No  . Drug Use: No  . Sexual Activity: Not on file   Other Topics Concern  . Not on file   Social History Narrative     BP 140/80 mmHg  Pulse 70  Ht 5\' 6"  (1.676 m)  Wt 156 lb (70.761 kg)  BMI 25.19 kg/m2  SpO2  98%  Physical Exam:  stable appearing 81 year old woman, NAD HEENT: Unremarkable Neck:  No JVD, no thyromegally Back:  No CVA tenderness Lungs:  Clear with no wheezes, rales, or rhonchi. Large pocket hematoma is present with no drainage. HEART:  Regular rate rhythm, no murmurs, no rubs, no clicks Abd:  soft, positive bowel sounds, no organomegally, no rebound, no guarding Ext:  2 plus pulses, no edema, no cyanosis, no clubbing Skin:  No rashes no nodules Neuro:  CN II through XII intact, motor grossly intact   DEVICE  The patient refused device interogation  Assess/Plan:

## 2014-06-29 NOTE — Patient Instructions (Signed)
Follow up in Device Clinic at 4:30pm on 2/18 for Wound Check.   Your physician recommends that you continue on your current medications as directed. Please refer to the Current Medication list given to you today.

## 2014-06-30 ENCOUNTER — Ambulatory Visit: Payer: Medicare Other

## 2014-07-13 ENCOUNTER — Other Ambulatory Visit: Payer: Self-pay | Admitting: Internal Medicine

## 2014-07-14 NOTE — Telephone Encounter (Signed)
Done hardcopy to Cherina  

## 2014-07-14 NOTE — Telephone Encounter (Signed)
Faxed hard copy of tramadol to [facility] [address]

## 2014-07-16 ENCOUNTER — Other Ambulatory Visit: Payer: Self-pay | Admitting: Internal Medicine

## 2014-07-20 ENCOUNTER — Telehealth: Payer: Self-pay | Admitting: Internal Medicine

## 2014-07-20 NOTE — Telephone Encounter (Signed)
Very sorry, but this is out of my scope of practice, I do not normally prescribe this medication, pt should re-establish with new optho

## 2014-07-20 NOTE — Telephone Encounter (Signed)
Patient would like to know if the doctor can call her in some eye drops medication called TRAVATAN, one drop in eyes at night.  Her eye doctor, Dr. Vonna Kotyk dropped her as a patient because she missed two appointments.  She said they didn't take into consideration that she is 81 years old. She said she needs these eye drops.

## 2014-07-22 NOTE — Telephone Encounter (Signed)
Spoke with patient this morning about her eye drops. She is aware of Dr. Raphael Gibney message. She has agreed to find a new eye doctor in order to get her eye drops.

## 2014-08-18 ENCOUNTER — Other Ambulatory Visit: Payer: Self-pay | Admitting: Internal Medicine

## 2014-08-23 ENCOUNTER — Other Ambulatory Visit: Payer: Self-pay | Admitting: Internal Medicine

## 2014-08-24 NOTE — Telephone Encounter (Signed)
Done erx 

## 2014-08-31 ENCOUNTER — Encounter: Payer: Medicare Other | Admitting: Internal Medicine

## 2014-09-30 ENCOUNTER — Encounter: Payer: Medicare Other | Admitting: Internal Medicine

## 2014-10-29 ENCOUNTER — Other Ambulatory Visit: Payer: Self-pay | Admitting: *Deleted

## 2014-10-29 DIAGNOSIS — Z950 Presence of cardiac pacemaker: Secondary | ICD-10-CM

## 2014-10-29 MED ORDER — CARVEDILOL 12.5 MG PO TABS: 12.5000 mg | ORAL_TABLET | Freq: Two times a day (BID) | ORAL | Status: DC

## 2014-11-05 ENCOUNTER — Encounter: Payer: Self-pay | Admitting: *Deleted

## 2015-01-05 ENCOUNTER — Other Ambulatory Visit: Payer: Self-pay | Admitting: Internal Medicine

## 2015-01-07 ENCOUNTER — Other Ambulatory Visit: Payer: Self-pay | Admitting: *Deleted

## 2015-01-07 DIAGNOSIS — Z950 Presence of cardiac pacemaker: Secondary | ICD-10-CM

## 2015-01-07 MED ORDER — CARVEDILOL 12.5 MG PO TABS: 12.5000 mg | ORAL_TABLET | Freq: Two times a day (BID) | ORAL | Status: DC

## 2015-02-07 ENCOUNTER — Other Ambulatory Visit: Payer: Self-pay | Admitting: Internal Medicine

## 2015-03-15 ENCOUNTER — Other Ambulatory Visit: Payer: Self-pay | Admitting: Internal Medicine

## 2015-03-16 ENCOUNTER — Other Ambulatory Visit: Payer: Self-pay | Admitting: Internal Medicine

## 2015-03-21 ENCOUNTER — Telehealth: Payer: Self-pay | Admitting: *Deleted

## 2015-03-21 NOTE — Telephone Encounter (Signed)
Pt left msg on triage stating having some problems wanting md to Rx a mild anti-depressant. Called pt back inform her that she will need to be seen before md can rx something. She has not seen md since 2014. Pt states she will call back if she need appt...[name]

## 2015-04-28 ENCOUNTER — Ambulatory Visit: Payer: Medicare Other | Admitting: Internal Medicine

## 2015-05-10 ENCOUNTER — Ambulatory Visit: Payer: Medicare Other | Admitting: Internal Medicine

## 2015-05-16 ENCOUNTER — Other Ambulatory Visit: Payer: Self-pay | Admitting: Internal Medicine

## 2015-05-19 ENCOUNTER — Encounter: Payer: Self-pay | Admitting: *Deleted

## 2015-06-27 ENCOUNTER — Telehealth: Payer: Self-pay

## 2015-06-27 NOTE — Telephone Encounter (Signed)
Patients pharmacy sent in form for patient to have refill on her tramadol, her refills are about to expire. They need a written prescription for this. Please advise.

## 2015-06-28 NOTE — Telephone Encounter (Signed)
Very sorry,  No further refills as pt last visit with me was dec 2014

## 2015-06-29 ENCOUNTER — Encounter: Payer: Self-pay | Admitting: Internal Medicine

## 2015-06-29 ENCOUNTER — Telehealth: Payer: Self-pay | Admitting: Internal Medicine

## 2015-06-29 NOTE — Telephone Encounter (Addendum)
Spoke with patient and let her know Valium can not be called in.  She needs to get this from her PCP but I will ask Dr Ladona Ridgel tomorrow if he will give rx for her device appointments.  She is aware and wants to cancel her appointment for tomorrow. She is going to keep her follow up appointment with Dr Ladona Ridgel tomorrow as she says the wires are coming through her skin.  I promised her we would not check her device tomorrow as she does not have any Valium

## 2015-06-29 NOTE — Telephone Encounter (Signed)
New Message:  Pt called in stating that she is suppose to come in for an appt tomorrow but she usually takes a tranquilizer. She called in to the pharmacy to get it refilled but she doesn't know the name and the pharmacy does not have a record of a tranquilizer. She would like to know if Dr. Ladona Ridgel could call in one for her.

## 2015-06-29 NOTE — Telephone Encounter (Signed)
Follow Up  Pt called states that when she got off the phone she forgot everything

## 2015-06-29 NOTE — Telephone Encounter (Signed)
This encounter was created in error - please disregard.

## 2015-06-29 NOTE — Telephone Encounter (Signed)
Pt calling stating that she would like for Dr. Ladona Ridgel to prescribe her something to keep her calm, like he has done in the pass, before she comes in tomorrow 06/30/15 for her pacer ck. Please advise

## 2015-06-30 ENCOUNTER — Ambulatory Visit (INDEPENDENT_AMBULATORY_CARE_PROVIDER_SITE_OTHER): Payer: Medicare Other | Admitting: Internal Medicine

## 2015-06-30 ENCOUNTER — Encounter: Payer: Self-pay | Admitting: Internal Medicine

## 2015-06-30 ENCOUNTER — Encounter: Payer: Medicare Other | Admitting: Internal Medicine

## 2015-06-30 VITALS — BP 194/84 | HR 86 | Ht 64.0 in | Wt 160.0 lb

## 2015-06-30 DIAGNOSIS — T827XXA Infection and inflammatory reaction due to other cardiac and vascular devices, implants and grafts, initial encounter: Secondary | ICD-10-CM | POA: Diagnosis not present

## 2015-06-30 MED ORDER — CEPHALEXIN 250 MG PO CAPS: 250.0000 mg | ORAL_CAPSULE | Freq: Four times a day (QID) | ORAL | Status: DC

## 2015-06-30 NOTE — Progress Notes (Signed)
HPI Mrs. Hubbard returns today for followup. She is a pleasant elderly woman with a h/o complete heart block, chronic systolic heart failure and multiple lead issues. Her pacing system was initially placed in 1996, with upgrade to a dual-chamber ICD, followed by a biventricular pacemaker. Her pacing/sense portion of her ICD lead has failed. She has an elevated left ventricular pacing threshold. Her right atrial lead does not pace but does sense. With all this she underwent pacemaker revision but after 2 months developed thinning of the skin and pending erosion and underwent a salvage procedure over a year ago. She had no drainage or problem with her device until 3 days ago when her skin suddenly began to swell and the skin thin and yesterday eroded through the skin. She has not been ill. No fever or chills. She has not been in the hospital.   No Known Allergies   Current Outpatient Prescriptions  Medication Sig Dispense Refill  . aspirin 325 MG EC tablet Take 325 mg by mouth every 6 (six) hours as needed for pain.     . carvedilol (COREG) 12.5 MG tablet take 1 tablet by mouth twice a day 60 tablet 0  . cholecalciferol (VITAMIN D) 1000 UNITS tablet Take 1,000 Units by mouth daily.    . colchicine 0.6 MG tablet take 1 tablet by mouth every 12 hours 60 tablet 3  . Menaquinone-7 (VITAMIN K2 PO) Take 1 tablet by mouth daily.    . Potassium 99 MG TABS Take 99 mg by mouth daily.    . Probiotic Product (NATRUL PROBIOTIC PO) Take 325 mg by mouth 2 (two) times daily.    . traMADol (ULTRAM) 50 MG tablet take 1 tablet by mouth four times a day if needed (MUST HAVE OFFICE VISIT FOR MORE REFILLS) 120 tablet 2  . TRAVATAN Z 0.004 % SOLN ophthalmic solution Place 1 drop into both eyes at bedtime.     No current facility-administered medications for this visit.     Past Medical History  Diagnosis Date  . Status post biventricular pacemaker     with AICD; 2004; revision 2007  . Complete heart block     . Nonischemic cardiomyopathy (HCC)     EF 45-50% in 2009  . Systolic heart failure   . Macular degeneration   . Ventricular tachycardia   . Arthritis   . Glaucoma   . Hyperlipidemia   . Essential hypertension, benign   . Hypothyroidism   . Osteoporosis   . Allergy   . Impaired glucose tolerance 02/02/2011  . Vitamin D deficiency 02/02/2011  . Hearing loss 02/02/2011    ROS:   All systems reviewed and negative except as noted in the HPI.   Past Surgical History  Procedure Laterality Date  . Bi-ventricular pacemaker revision (crt-r)  03-08-2014    MDT CRTP generator change with use of previously abandoned RV lead due to ICD RV lead failure  . Cholecystectomy    . Appendectomy    . Tonsillectomy    . Cataract surgery    . Bi-ventricular pacemaker revision N/A 03/08/2014    Procedure: BI-VENTRICULAR PACEMAKER REVISION (CRT-R);  Surgeon: Marinus Maw, MD;  Location: [facility] CATH LAB;  Service: Cardiovascular;  Laterality: N/A;  . Bi-ventricular pacemaker revision N/A 06/16/2014    Procedure: BI-VENTRICULAR PACEMAKER REVISION (CRT-R);  Surgeon: Marinus Maw, MD;  Location: [facility] CATH LAB;  Service: Cardiovascular;  Laterality: N/A;     Family History  Problem Relation Age  of Onset  . Hypertension Mother   . Coronary artery disease Mother   . Arthritis Other   . Cancer Other     lung cancer  . Heart disease Other   . Hypertension Other      Social History   Social History  . Marital Status: Widowed    Spouse Name: N/A  . Number of Children: N/A  . Years of Education: N/A   Occupational History  . retired    Social History Main Topics  . Smoking status: Never Smoker   . Smokeless tobacco: Never Used  . Alcohol Use: No  . Drug Use: No  . Sexual Activity: Not on file   Other Topics Concern  . Not on file   Social History Narrative     BP 194/84 mmHg  Pulse 86  Ht  (1.626 m)  Wt 160 lb (72.576 kg)  BMI 27.45 kg/m2  SpO2 95%  Physical  Exam:  stable appearing 82 year old woman, NAD HEENT: Unremarkable Neck:  6 cm JVD, no thyromegally Back:  No CVA tenderness Lungs:  Clear with no wheezes, rales, or rhonchi. There is a 2 cm opening in the skin and the pacing lead is visible. HEART:  Regular rate rhythm, no murmurs, no rubs, no clicks Abd:  soft, positive bowel sounds, no organomegally, no rebound, no guarding Ext:  2 plus pulses, no edema, no cyanosis, no clubbing Skin:  No rashes no nodules Neuro:  CN II through XII intact, motor grossly intact  DEVICE  The patient refused device interogation.  Assess/Plan: 1. Pacing system infection - I have discussed the treatment options with the patient and her son. I have explained that there is no medicine that we can give her that will solve her problem. The only way to heal her infection is to remove everything. I expect that this will require a combination approach with the groin being used to extract the majority of her pacing system. I explained that her risk of death is increased. She understands and is willing to proceed. 2. Complete heart block - will place a temp perm system in place after her extraction 3. Chronic systolic heart failure - her symptoms are class 2. Will continue her current meds. 4. HTN - her blood pressure is quite elevated today. Will plan to adjust her meds after her device is removed.  Tiffany [name].D.

## 2015-06-30 NOTE — Patient Instructions (Signed)
Medication Instructions:  Your physician has recommended you make the following change in your medication:  1) Start Keflex 250mg  twice daily   Labwork: None ordered   Testing/Procedures: Will call with the time for your procedure  Looking at 07/06/15  Follow-Up: Your physician recommends that you schedule a follow-up appointment in 10-14 days after extraction in device clinic for wound check.   Any Other Special Instructions Will Be Listed Below (If Applicable).     If you need a refill on your cardiac medications before your next appointment, please call your pharmacy.

## 2015-07-08 ENCOUNTER — Encounter (HOSPITAL_COMMUNITY): Payer: Self-pay | Admitting: *Deleted

## 2015-07-08 NOTE — Progress Notes (Signed)
I called [employer] and asked that a message to be sent to [facility] of patient's surgery date and time.  I asked them to have Feliz Beam to called Pre- Op on Monday for any questions.

## 2015-07-10 MED ORDER — SODIUM CHLORIDE 0.9 % IR SOLN
80.0000 mg | Status: DC
  Filled 2015-07-10: qty 2

## 2015-07-10 MED ORDER — CEFAZOLIN SODIUM-DEXTROSE 2-3 GM-% IV SOLR: 2.0000 g | INTRAVENOUS | Status: DC

## 2015-07-11 ENCOUNTER — Inpatient Hospital Stay (HOSPITAL_COMMUNITY): Payer: Medicare Other | Admitting: Certified Registered Nurse Anesthetist

## 2015-07-11 ENCOUNTER — Other Ambulatory Visit (HOSPITAL_COMMUNITY): Payer: Self-pay

## 2015-07-11 ENCOUNTER — Inpatient Hospital Stay (HOSPITAL_COMMUNITY): Payer: Medicare Other

## 2015-07-11 ENCOUNTER — Inpatient Hospital Stay (HOSPITAL_COMMUNITY)
Admission: RE | Admit: 2015-07-11 | Discharge: 2015-07-22 | DRG: 242 | Disposition: A | Discharge status: Home Health | Payer: Medicare Other | Source: Ambulatory Visit | Attending: Internal Medicine | Admitting: Internal Medicine

## 2015-07-11 ENCOUNTER — Encounter (HOSPITAL_COMMUNITY): Payer: Self-pay | Admitting: *Deleted

## 2015-07-11 ENCOUNTER — Encounter (HOSPITAL_COMMUNITY)
Admission: RE | Disposition: A | Discharge status: Home Health | Payer: Self-pay | Source: Ambulatory Visit | Attending: Internal Medicine

## 2015-07-11 DIAGNOSIS — I428 Other cardiomyopathies: Secondary | ICD-10-CM | POA: Diagnosis present

## 2015-07-11 DIAGNOSIS — N189 Chronic kidney disease, unspecified: Secondary | ICD-10-CM | POA: Diagnosis not present

## 2015-07-11 DIAGNOSIS — N179 Acute kidney failure, unspecified: Secondary | ICD-10-CM | POA: Diagnosis not present

## 2015-07-11 DIAGNOSIS — R531 Weakness: Secondary | ICD-10-CM | POA: Diagnosis not present

## 2015-07-11 DIAGNOSIS — I442 Atrioventricular block, complete: Secondary | ICD-10-CM | POA: Diagnosis present

## 2015-07-11 DIAGNOSIS — Y831 Surgical operation with implant of artificial internal device as the cause of abnormal reaction of the patient, or of later complication, without mention of misadventure at the time of the procedure: Secondary | ICD-10-CM | POA: Diagnosis present

## 2015-07-11 DIAGNOSIS — I5022 Chronic systolic (congestive) heart failure: Secondary | ICD-10-CM | POA: Diagnosis not present

## 2015-07-11 DIAGNOSIS — M109 Gout, unspecified: Secondary | ICD-10-CM | POA: Diagnosis not present

## 2015-07-11 DIAGNOSIS — I5041 Acute combined systolic (congestive) and diastolic (congestive) heart failure: Secondary | ICD-10-CM | POA: Diagnosis present

## 2015-07-11 DIAGNOSIS — T827XXA Infection and inflammatory reaction due to other cardiac and vascular devices, implants and grafts, initial encounter: Secondary | ICD-10-CM | POA: Diagnosis present

## 2015-07-11 DIAGNOSIS — H9190 Unspecified hearing loss, unspecified ear: Secondary | ICD-10-CM | POA: Diagnosis present

## 2015-07-11 DIAGNOSIS — B9689 Other specified bacterial agents as the cause of diseases classified elsewhere: Secondary | ICD-10-CM | POA: Diagnosis not present

## 2015-07-11 DIAGNOSIS — D649 Anemia, unspecified: Secondary | ICD-10-CM | POA: Diagnosis not present

## 2015-07-11 DIAGNOSIS — F0391 Unspecified dementia with behavioral disturbance: Secondary | ICD-10-CM | POA: Diagnosis present

## 2015-07-11 DIAGNOSIS — Y838 Other surgical procedures as the cause of abnormal reaction of the patient, or of later complication, without mention of misadventure at the time of the procedure: Secondary | ICD-10-CM | POA: Diagnosis not present

## 2015-07-11 DIAGNOSIS — I130 Hypertensive heart and chronic kidney disease with heart failure and stage 1 through stage 4 chronic kidney disease, or unspecified chronic kidney disease: Secondary | ICD-10-CM | POA: Diagnosis present

## 2015-07-11 DIAGNOSIS — E039 Hypothyroidism, unspecified: Secondary | ICD-10-CM | POA: Diagnosis present

## 2015-07-11 DIAGNOSIS — T827XXD Infection and inflammatory reaction due to other cardiac and vascular devices, implants and grafts, subsequent encounter: Secondary | ICD-10-CM

## 2015-07-11 DIAGNOSIS — D62 Acute posthemorrhagic anemia: Secondary | ICD-10-CM | POA: Diagnosis not present

## 2015-07-11 DIAGNOSIS — F03918 Unspecified dementia, unspecified severity, with other behavioral disturbance: Secondary | ICD-10-CM | POA: Insufficient documentation

## 2015-07-11 DIAGNOSIS — I10 Essential (primary) hypertension: Secondary | ICD-10-CM | POA: Diagnosis not present

## 2015-07-11 DIAGNOSIS — I429 Cardiomyopathy, unspecified: Secondary | ICD-10-CM | POA: Diagnosis present

## 2015-07-11 DIAGNOSIS — I071 Rheumatic tricuspid insufficiency: Secondary | ICD-10-CM | POA: Diagnosis present

## 2015-07-11 DIAGNOSIS — M1000 Idiopathic gout, unspecified site: Secondary | ICD-10-CM | POA: Diagnosis not present

## 2015-07-11 DIAGNOSIS — Z959 Presence of cardiac and vascular implant and graft, unspecified: Secondary | ICD-10-CM

## 2015-07-11 HISTORY — PX: PACEMAKER LEAD REMOVAL: SHX5064

## 2015-07-11 HISTORY — DX: Unspecified hearing loss, unspecified ear: H91.90

## 2015-07-11 HISTORY — DX: Major depressive disorder, single episode, unspecified: F32.9

## 2015-07-11 HISTORY — DX: Reserved for inherently not codable concepts without codable children: IMO0001

## 2015-07-11 HISTORY — DX: Depression, unspecified: F32.A

## 2015-07-11 LAB — CBC
HCT: 37.2 % (ref 36.0–46.0)
Hemoglobin: 12.7 g/dL (ref 12.0–15.0)
MCH: 33.6 pg (ref 26.0–34.0)
MCHC: 34.1 g/dL (ref 30.0–36.0)
MCV: 98.4 fL (ref 78.0–100.0)
PLATELETS: 133 10*3/uL — AB (ref 150–400)
RBC: 3.78 MIL/uL — ABNORMAL LOW (ref 3.87–5.11)
RDW: 13.5 % (ref 11.5–15.5)
WBC: 8.8 10*3/uL (ref 4.0–10.5)

## 2015-07-11 LAB — PREPARE RBC (CROSSMATCH)

## 2015-07-11 LAB — BASIC METABOLIC PANEL
ANION GAP: 12 (ref 5–15)
BUN: 15 mg/dL (ref 6–20)
CALCIUM: 9.6 mg/dL (ref 8.9–10.3)
CO2: 26 mmol/L (ref 22–32)
CREATININE: 1.08 mg/dL — AB (ref 0.44–1.00)
Chloride: 104 mmol/L (ref 101–111)
GFR calc Af Amer: 50 mL/min — ABNORMAL LOW (ref >60)
GFR, EST NON AFRICAN AMERICAN: 44 mL/min — AB (ref >60)
GLUCOSE: 107 mg/dL — AB (ref 65–99)
Potassium: 3.5 mmol/L (ref 3.5–5.1)
Sodium: 142 mmol/L (ref 135–145)

## 2015-07-11 LAB — APTT: APTT: 24 s (ref 24–37)

## 2015-07-11 LAB — PROTIME-INR
INR: 0.99 (ref 0.00–1.49)
Prothrombin Time: 13.2 seconds (ref 11.6–15.2)

## 2015-07-11 LAB — ABO/RH: ABO/RH(D): B POS

## 2015-07-11 SURGERY — REMOVAL, ELECTRODE LEAD, CARDIAC PACEMAKER, WITHOUT REPLACEMENT
Anesthesia: General | Site: Chest

## 2015-07-11 MED ORDER — PHENYLEPHRINE HCL 10 MG/ML IJ SOLN
10.0000 mg | INTRAMUSCULAR | Status: DC | PRN
  Administered 2015-07-11: 20 ug/min via INTRAVENOUS

## 2015-07-11 MED ORDER — CARVEDILOL 12.5 MG PO TABS
12.5000 mg | ORAL_TABLET | Freq: Once | ORAL | Status: AC
  Administered 2015-07-11: 12.5 mg via ORAL

## 2015-07-11 MED ORDER — FENTANYL CITRATE (PF) 250 MCG/5ML IJ SOLN
INTRAMUSCULAR | Status: AC
  Filled 2015-07-11: qty 5

## 2015-07-11 MED ORDER — DEXAMETHASONE SODIUM PHOSPHATE 10 MG/ML IJ SOLN
INTRAMUSCULAR | Status: AC
  Filled 2015-07-11: qty 1

## 2015-07-11 MED ORDER — HYDROMORPHONE HCL 1 MG/ML IJ SOLN: 0.2500 mg | INTRAMUSCULAR | Status: DC | PRN

## 2015-07-11 MED ORDER — LIDOCAINE HCL (PF) 1 % IJ SOLN
INTRAMUSCULAR | Status: DC | PRN
  Administered 2015-07-11: 30 mL

## 2015-07-11 MED ORDER — FENTANYL CITRATE (PF) 100 MCG/2ML IJ SOLN
INTRAMUSCULAR | Status: DC | PRN
  Administered 2015-07-11 (×3): 50 ug via INTRAVENOUS

## 2015-07-11 MED ORDER — GENTAMICIN SULFATE 40 MG/ML IJ SOLN
INTRAMUSCULAR | Status: DC | PRN
  Administered 2015-07-11: 500 mL

## 2015-07-11 MED ORDER — PHENYLEPHRINE 40 MCG/ML (10ML) SYRINGE FOR IV PUSH (FOR BLOOD PRESSURE SUPPORT)
PREFILLED_SYRINGE | INTRAVENOUS | Status: AC
  Filled 2015-07-11: qty 10

## 2015-07-11 MED ORDER — ONDANSETRON HCL 4 MG/2ML IJ SOLN
INTRAMUSCULAR | Status: AC
  Filled 2015-07-11: qty 2

## 2015-07-11 MED ORDER — HYDROMORPHONE HCL 1 MG/ML IJ SOLN
INTRAMUSCULAR | Status: AC
  Administered 2015-07-11: 0.25 mg via INTRAVENOUS
  Filled 2015-07-11: qty 1

## 2015-07-11 MED ORDER — ROCURONIUM BROMIDE 50 MG/5ML IV SOLN
INTRAVENOUS | Status: AC
  Filled 2015-07-11: qty 1

## 2015-07-11 MED ORDER — HYDROMORPHONE HCL 1 MG/ML IJ SOLN
0.2500 mg | INTRAMUSCULAR | Status: DC | PRN
  Administered 2015-07-11 (×2): 0.25 mg via INTRAVENOUS

## 2015-07-11 MED ORDER — SUCCINYLCHOLINE CHLORIDE 20 MG/ML IJ SOLN
INTRAMUSCULAR | Status: DC | PRN
  Administered 2015-07-11: 100 mg via INTRAVENOUS

## 2015-07-11 MED ORDER — CEFAZOLIN SODIUM-DEXTROSE 2-3 GM-% IV SOLR
INTRAVENOUS | Status: AC
  Administered 2015-07-11: 2 g via INTRAVENOUS
  Filled 2015-07-11: qty 50

## 2015-07-11 MED ORDER — LIDOCAINE HCL (CARDIAC) 20 MG/ML IV SOLN
INTRAVENOUS | Status: DC | PRN
  Administered 2015-07-11: 100 mg via INTRAVENOUS

## 2015-07-11 MED ORDER — IODIXANOL 320 MG/ML IV SOLN
INTRAVENOUS | Status: DC | PRN
  Administered 2015-07-11: 50 mL via INTRAVENOUS

## 2015-07-11 MED ORDER — ONDANSETRON HCL 4 MG/2ML IJ SOLN: 4.0000 mg | Freq: Four times a day (QID) | INTRAMUSCULAR | Status: DC | PRN

## 2015-07-11 MED ORDER — CEFAZOLIN SODIUM 1-5 GM-% IV SOLN
1.0000 g | Freq: Four times a day (QID) | INTRAVENOUS | Status: AC
  Administered 2015-07-12 (×3): 1 g via INTRAVENOUS
  Filled 2015-07-11 (×3): qty 50

## 2015-07-11 MED ORDER — LACTATED RINGERS IV SOLN
INTRAVENOUS | Status: DC
  Administered 2015-07-11 (×3): via INTRAVENOUS

## 2015-07-11 MED ORDER — HEPARIN SODIUM (PORCINE) 5000 UNIT/ML IJ SOLN
5000.0000 [IU] | Freq: Three times a day (TID) | INTRAMUSCULAR | Status: DC
  Administered 2015-07-12 – 2015-07-17 (×17): 5000 [IU] via SUBCUTANEOUS
  Filled 2015-07-11 (×17): qty 1

## 2015-07-11 MED ORDER — CHLORHEXIDINE GLUCONATE 4 % EX LIQD
60.0000 mL | Freq: Once | CUTANEOUS | Status: DC
Start: 2015-07-11 — End: 2015-07-11

## 2015-07-11 MED ORDER — PROPOFOL 10 MG/ML IV BOLUS
INTRAVENOUS | Status: AC
  Filled 2015-07-11: qty 20

## 2015-07-11 MED ORDER — SODIUM CHLORIDE 0.9 % IR SOLN
Freq: Once | Status: DC
  Filled 2015-07-11: qty 2

## 2015-07-11 MED ORDER — CARVEDILOL 12.5 MG PO TABS
ORAL_TABLET | ORAL | Status: AC
  Administered 2015-07-11: 12.5 mg via ORAL
  Filled 2015-07-11: qty 1

## 2015-07-11 MED ORDER — ONDANSETRON HCL 4 MG/2ML IJ SOLN: 4.0000 mg | Freq: Once | INTRAMUSCULAR | Status: DC | PRN

## 2015-07-11 MED ORDER — SODIUM CHLORIDE 0.9 % IV SOLN: Freq: Once | INTRAVENOUS | Status: DC

## 2015-07-11 MED ORDER — SODIUM CHLORIDE 0.9 % IV SOLN
INTRAVENOUS | Status: DC | PRN
  Administered 2015-07-11: 500 mL

## 2015-07-11 MED ORDER — SODIUM CHLORIDE 0.9 % IR SOLN
Status: AC
  Filled 2015-07-11: qty 2

## 2015-07-11 MED ORDER — LIDOCAINE HCL (PF) 1 % IJ SOLN
INTRAMUSCULAR | Status: AC
  Filled 2015-07-11: qty 30

## 2015-07-11 MED ORDER — SUGAMMADEX SODIUM 200 MG/2ML IV SOLN
INTRAVENOUS | Status: DC | PRN
  Administered 2015-07-11: 200 mg via INTRAVENOUS

## 2015-07-11 MED ORDER — SODIUM CHLORIDE 0.9 % IV SOLN: INTRAVENOUS | Status: DC

## 2015-07-11 MED ORDER — SUCCINYLCHOLINE CHLORIDE 20 MG/ML IJ SOLN
INTRAMUSCULAR | Status: AC
  Filled 2015-07-11: qty 1

## 2015-07-11 MED ORDER — LACTATED RINGERS IV SOLN
INTRAVENOUS | Status: DC
  Administered 2015-07-11: 15:00:00 via INTRAVENOUS

## 2015-07-11 MED ORDER — TRAMADOL HCL 50 MG PO TABS
50.0000 mg | ORAL_TABLET | Freq: Four times a day (QID) | ORAL | Status: DC
  Administered 2015-07-12 – 2015-07-15 (×11): 50 mg via ORAL
  Filled 2015-07-11 (×13): qty 1

## 2015-07-11 MED ORDER — DEXAMETHASONE SODIUM PHOSPHATE 10 MG/ML IJ SOLN
INTRAMUSCULAR | Status: DC | PRN
  Administered 2015-07-11: 10 mg via INTRAVENOUS

## 2015-07-11 MED ORDER — LIDOCAINE HCL (CARDIAC) 20 MG/ML IV SOLN
INTRAVENOUS | Status: AC
  Filled 2015-07-11: qty 5

## 2015-07-11 MED ORDER — MEPERIDINE HCL 25 MG/ML IJ SOLN: 6.2500 mg | INTRAMUSCULAR | Status: DC | PRN

## 2015-07-11 MED ORDER — EPHEDRINE SULFATE 50 MG/ML IJ SOLN
INTRAMUSCULAR | Status: AC
  Filled 2015-07-11: qty 1

## 2015-07-11 MED ORDER — ACETAMINOPHEN 325 MG PO TABS
325.0000 mg | ORAL_TABLET | ORAL | Status: DC | PRN
  Administered 2015-07-13: 650 mg via ORAL
  Administered 2015-07-14: 325 mg via ORAL
  Filled 2015-07-11 (×3): qty 2

## 2015-07-11 MED ORDER — PROPOFOL 10 MG/ML IV BOLUS
INTRAVENOUS | Status: DC | PRN
  Administered 2015-07-11: 120 mg via INTRAVENOUS

## 2015-07-11 MED ORDER — ROCURONIUM BROMIDE 100 MG/10ML IV SOLN
INTRAVENOUS | Status: DC | PRN
  Administered 2015-07-11 (×2): 20 mg via INTRAVENOUS

## 2015-07-11 SURGICAL SUPPLY — 62 items
ADAPTER UNIV SWAN GANZ BIP (ADAPTER) ×1 IMPLANT
ADAPTER UNV SWAN GANZ BIP (ADAPTER) ×2
BAG BANDED W/RUBBER/TAPE 36X54 (MISCELLANEOUS) ×6 IMPLANT
BALLN OCL BRIDGE 80X90X.9 60 (BALLOONS) ×3
BALLOON OCL BRIDGE 80X90X.9 60 (BALLOONS) ×1 IMPLANT
BLADE 10 SAFETY STRL DISP (BLADE) ×3 IMPLANT
BLADE OSCILLATING /SAGITTAL (BLADE) IMPLANT
BLADE STERNUM SYSTEM 6 (BLADE) ×3 IMPLANT
BLADE SURG 10 STRL SS (BLADE) ×3 IMPLANT
BNDG COHESIVE 4X5 WHT NS (GAUZE/BANDAGES/DRESSINGS) IMPLANT
BRIDGE OCCLUSION BALLOON ×2 IMPLANT
BRIDGE PREP KIT ×2 IMPLANT
BRIDGE PREP KIT (KITS) ×2
CABLE/EXTENSION OR 1X16 61 (CABLE) ×2 IMPLANT
CABLE/EXTENSION OR 1X16 61CM (CABLE) ×1
CANISTER SUCTION 2500CC (MISCELLANEOUS) ×3 IMPLANT
CATH ANGIO 5F BER2 65CM (CATHETERS) ×3 IMPLANT
COVER TABLE BACK 60X90 (DRAPES) ×3 IMPLANT
DEVICE LCKNG LEAD CARDIAC (CATHETERS) ×4 IMPLANT
DILATOR SHEATH BYRD (MISCELLANEOUS) ×2
DILATOR SHTH C REG 13-15X18FR (MISCELLANEOUS) ×1 IMPLANT
DILTR SHTH C REG 13-15X18FR 13 (MISCELLANEOUS) ×1
DRAPE CARDIOVASCULAR INCISE (DRAPES) ×2
DRAPE SRG 135X102X78XABS (DRAPES) ×1 IMPLANT
DRSG OPSITE 6X11 MED (GAUZE/BANDAGES/DRESSINGS) IMPLANT
DRSG TEGADERM 4X4.75 (GAUZE/BANDAGES/DRESSINGS) ×6 IMPLANT
ELECT REM PT RETURN 9FT ADLT (ELECTROSURGICAL) ×6
ELECTRODE REM PT RTRN 9FT ADLT (ELECTROSURGICAL) ×2 IMPLANT
EXTENDER BULLDOG LEAD (MISCELLANEOUS) ×3 IMPLANT
GAUZE PACKING IODOFORM 1X5 (MISCELLANEOUS) ×3 IMPLANT
GAUZE SPONGE 4X4 12PLY STRL (GAUZE/BANDAGES/DRESSINGS) IMPLANT
GAUZE SPONGE 4X4 16PLY XRAY LF (GAUZE/BANDAGES/DRESSINGS) ×9 IMPLANT
GLOVE BIOGEL PI IND STRL 7.5 (GLOVE) ×1 IMPLANT
GLOVE BIOGEL PI INDICATOR 7.5 (GLOVE) ×2
GLOVE ECLIPSE 8.0 STRL XLNG CF (GLOVE) ×3 IMPLANT
GOWN STRL REUS W/ TWL LRG LVL3 (GOWN DISPOSABLE) IMPLANT
GOWN STRL REUS W/ TWL XL LVL3 (GOWN DISPOSABLE) ×1 IMPLANT
GOWN STRL REUS W/TWL LRG LVL3 (GOWN DISPOSABLE)
GOWN STRL REUS W/TWL XL LVL3 (GOWN DISPOSABLE) ×2
GUIDEWIRE ANGLED .035X150CM (WIRE) ×3 IMPLANT
KIT BRIDGE PREP (KITS) ×1 IMPLANT
KIT ROOM TURNOVER OR (KITS) ×3 IMPLANT
LEAD CAPSURE NOVUS 5076-52CM (Lead) ×3 IMPLANT
NEEDLE PERC 18GX7CM (NEEDLE) ×3 IMPLANT
PAD ARMBOARD 7.5X6 YLW CONV (MISCELLANEOUS) ×6 IMPLANT
PAD ELECT DEFIB RADIOL ZOLL (MISCELLANEOUS) ×3 IMPLANT
REMOVAL LLD CARDIAC LEAD EZ (CATHETERS) ×12
SHEATH TIGHTRAIL MECH (SHEATH) ×1
SHEATH TIGHTRAIL MECH 11F (SHEATH) ×2 IMPLANT
SHEATH TIGHTRAIL MINI 11F (SHEATH) ×3 IMPLANT
SPONGE GAUZE 4X4 12PLY STER LF (GAUZE/BANDAGES/DRESSINGS) ×3 IMPLANT
SURESITE 123+ PAD ×9 IMPLANT
SUT PROLENE 2 0 SH DA (SUTURE) ×3 IMPLANT
SUT SILK 0 TIES 10X30 (SUTURE) ×3 IMPLANT
SUT SILK 1 SH (SUTURE) ×6 IMPLANT
TOWEL OR 17X24 6PK STRL BLUE (TOWEL DISPOSABLE) ×6 IMPLANT
TOWEL OR 17X26 10 PK STRL BLUE (TOWEL DISPOSABLE) ×6 IMPLANT
TRAY FOLEY IC TEMP SENS 14FR (CATHETERS) ×6 IMPLANT
TUBE CONNECTING 12'X1/4 (SUCTIONS)
TUBE CONNECTING 12X1/4 (SUCTIONS) IMPLANT
WIRE MAILMAN 182CM (WIRE) ×3 IMPLANT
YANKAUER SUCT BULB TIP NO VENT (SUCTIONS) IMPLANT

## 2015-07-11 NOTE — Progress Notes (Signed)
Orthopedic Tech Progress Note Patient Details:  Tiffany [name] Giovanni 1922/12/25 [id number]  Ortho Devices Type of Ortho Device: Knee Immobilizer Ortho Device/Splint Location: rle Ortho Device/Splint Interventions: Ordered, Application   Trinna Post 07/11/2015, 10:11 PM

## 2015-07-11 NOTE — CV Procedure (Signed)
Electrophysiology procedure note  Preoperative diagnosis: Pacemaker and ICD lead infection, in the setting of complete heart block  Postoperative diagnosis: Same as preoperative diagnosis  Description of the procedure: After informed consent was obtained, the patient was taken to the operating room in the fasting state. The anesthesia service was utilized to provide general anesthesia. Invasive arterial monitoring was placed by way of a catheter in the left radial artery. After the usual preparation draping, and the appropriate timeouts obtained, A 6 French sheath was placed in the right femoral vein. A 6 French sheath was placed in the left femoral vein. A 12 French sheath was placed in the left femoral vein. A stiff 035 guidewire was inserted into the left femoral vein through the 12 French sheath and advanced to the right internal jugular vein. An 80 cc compliant balloon was advanced over the guidewire and up to the jugular vein. The balloon was retracted into the inferior vena cava. Attention was then turned to removal of the pacemaker system and defibrillation lead. The patient had 4 leads in place. An atrial and ventricular pacemaker lead had been placed in 1996. These are passive fixation leads. The left ventricular lead had been placed in 2005 as had the defibrillation lead. Previously, the defibrillation lead had failed and had been capped. Attention was first turned to removal of the defibrillation lead. This was a Medtronic 6949-lead. A Spectranetics locking stylette was inserted into the body of the lead. The Spectranetics mini 11 French mechanical extraction sheath was advanced with much difficulty into the superior vena cava. The mini sheath was exchanged for longer Spectranetics 11 French mechanical extraction sheath, and utilizing a combination of pressure and counter pressure, traction and countertraction, the lead was removed in total. Attention was then turned to the atrial lead. It was also  locked in place with a Spectranetics locking stylette. The 11 French mini mechanical extraction sheath was inserted over the atrial pacing lead and down into the superior vena cava. Next the 11 French mini sheath was removed and exchanged for a longer 11 [place] Spectranetics mechanical extraction sheath. In the same manner, the atrial lead was removed in total. The atrial lead was bound up to the old ventricular pacing lead, and during the procedure there was transient hypotension which resolved after the lead was removed. This was thought due to partial and agitation of the right ventricle. Next attention was turned to the left ventricular pacing lead. Another Spectranetics locking stylette was inserted into the left ventricular pacing lead. Same 11 French Spectranetics mini mechanical extraction sheath was inserted into the right subclavian vein and then advanced into the superior vena cava. Gentle traction was placed on the lead and it was removed in total. Finally, attention was turned to the old right ventricular pacing lead. This was a passive fixation 9 [place] lead. Attempts to advance the locking stylette were only minimally successful with the stylette not being able to be passed across the junction between the clavicle and first rib. The 54 [place] Spectranetics mini mechanical extraction sheath was utilized and with much difficulty the dense fibrous adhesions were ultimately traversed down to the superior vena cava. At this point the longer 11 [place] Spectranetics a chemical extraction sheath was exchanged and utilized to advanced down to the junction of the right ventricle and the tip of the lead. The extraction sheath actually covered the tip of the right ventricle at the lead remained fixed. At this point, the cardiac surgery service was invited to be  on site for the remainder of the extraction procedure. A smaller 9 French polypropylene sheath was advanced over the ventricular pacing lead and under  fluoroscopic guidance advanced all the way down to the tip of the right ventricle. The lead was removed with gentle traction. There is no hemodynamic sequelae.   At this point, another temporary permanent pacemaker was placed by way of the right internal jugular approach and actively fixed in the right ventricle. This was a Medtronic 5076 active fixation pacing lead, serial number [id number]. The lead was secured to the skin after it was actively fixed. The R waves measured 15, the pacing impedance 980 ohms, and the patient threshold 1.1 V at 0.5 ms. With the satisfactory parameters, the new temporary permanent pacing lead was connected to the old biventricular pacemaker and the pacemaker was secured to the skin as well. A bandage was placed. The temporary pacemaker in the left femoral vein was removed and replaced to the right femoral vein and the sheaths in the left femoral vein were removed and hemostasis obtained. The patient was returned to the recovery area in stable condition.  Complications: There were no immediate procedural complications.  Estimated blood loss: 200 cc  Conclusion: Successful removal of a dual coil ICD lead, a left ventricular pacing lead, and 2 old atrial and ventricular past fixation pacing leads with successful insertion of a temporary permanent single-chamber pacemaker in a patient with pacemaker pocket infection and underlying complete heart block. It is anticipated that the patient will undergo insertion of a new permanent pacemaker once she becomes more stable.   Lewayne Bunting, M.D.

## 2015-07-11 NOTE — Anesthesia Procedure Notes (Signed)
Procedure Name: Intubation Date/Time: 07/11/2015 4:43 PM Performed by: Lewie Loron Pre-anesthesia Checklist: Patient identified, Emergency Drugs available, Suction available, Patient being monitored and Timeout performed Patient Re-evaluated:Patient Re-evaluated prior to inductionOxygen Delivery Method: Circle system utilized Preoxygenation: Pre-oxygenation with 100% oxygen Intubation Type: IV induction Ventilation: Mask ventilation without difficulty and Oral airway inserted - appropriate to patient size Laryngoscope Size: Mac and 3 Grade View: Grade I Tube type: Oral Tube size: 7.0 mm Number of attempts: 1 Airway Equipment and Method: Stylet Placement Confirmation: ETT inserted through vocal cords under direct vision,  positive ETCO2 and breath sounds checked- equal and bilateral Secured at: 21 cm Tube secured with: Tape Dental Injury: Teeth and Oropharynx as per pre-operative assessment

## 2015-07-11 NOTE — Transfer of Care (Signed)
Immediate Anesthesia Transfer of Care Note  Patient: Tiffany [name]  Procedure(s) Performed: Procedure(s): PACEMAKER LEAD REMOVAL with placement of temporary pacing wire (N/A)  Patient Location: PACU  Anesthesia Type:General  Level of Consciousness: awake, alert  and patient cooperative  Airway & Oxygen Therapy: Patient Spontanous Breathing  Post-op Assessment: Report given to RN and Post -op Vital signs reviewed and stable  Post vital signs: Reviewed and stable  Last Vitals:  Filed Vitals:   07/11/15 1435 07/11/15 2051  BP: 208/90   Pulse: 89   Temp: 36.4 C 36.6 C  Resp: 16     Complications: No apparent anesthesia complications

## 2015-07-11 NOTE — Anesthesia Preprocedure Evaluation (Addendum)
Anesthesia Evaluation  Patient identified by MRN, date of birth, ID band Patient awake    Reviewed: Allergy & Precautions, NPO status , Patient's Chart, lab work & pertinent test results  Airway Mallampati: I  TM Distance: >3 FB Neck ROM: Full    Dental   Pulmonary neg pulmonary ROS, shortness of breath,    Pulmonary exam normal        Cardiovascular hypertension, Pt. on medications +CHF  Normal cardiovascular exam+ dysrhythmias + pacemaker + Cardiac Defibrillator   Echo 03/2013 - Left ventricle: There is hypokinesis of the basal anterior and anteroseptal walls, akinesis of the basal anterolateral, basal and mid anterior walls and severe hypokinesis of the all the apical segments. The cavity size was normal. There was moderate concentric hypertrophy. Systolic function was severely reduced. The estimated ejection fraction was in the range of 25% to 30%. Doppler parameters are consistent with abnormal left ventricular relaxation (grade 1 diastolic dysfunction). Doppler parameters are consistent with elevated ventricular end-diastolic filling pressure. - Atrial septum: No defect or patent foramen ovale was identified. - Pulmonary arteries: PA peak pressure: 37mm Hg (S). Transthoracic echocardiography. M-mode, complete 2D, spectral Doppler, and color Doppler. Height: Height: 165cm. Height: 65in. Weight: Weight: 75.3kg. Weight:  Left ventricle: There is hypokinesis of the basal anterior and anteroseptal walls, akinesis of the basal anterolateral, basal and mid anterior walls and severe hypokinesis of the all the apical segments. The cavity size was normal. There was moderate concentric hypertrophy. Systolic function was severely reduced. The estimated ejection fraction was in the range of 25% to 30%. Doppler parameters are consistent with abnormal left ventricular relaxation (grade 1 diastolic dysfunction).  Doppler parameters are consistent with elevated ventricular end-diastolic filling pressure.    Neuro/Psych PSYCHIATRIC DISORDERS Depression negative neurological ROS     GI/Hepatic negative GI ROS, Neg liver ROS,   Endo/Other  negative endocrine ROSHypothyroidism   Renal/GU negative Renal ROS     Musculoskeletal negative musculoskeletal ROS (+) Arthritis ,   Abdominal   Peds  Hematology negative hematology ROS (+)   Anesthesia Other Findings   Reproductive/Obstetrics negative OB ROS                           Anesthesia Physical Anesthesia Plan  ASA: IV  Anesthesia Plan: General   Post-op Pain Management:    Induction: Intravenous  Airway Management Planned: Oral ETT  Additional Equipment: Arterial line  Intra-op Plan:   Post-operative Plan: Extubation in OR  Informed Consent: I have reviewed the patients History and Physical, chart, labs and discussed the procedure including the risks, benefits and alternatives for the proposed anesthesia with the patient or authorized representative who has indicated his/her understanding and acceptance.   Dental advisory given  Plan Discussed with: CRNA and Surgeon  Anesthesia Plan Comments:        Anesthesia Quick Evaluation

## 2015-07-11 NOTE — Anesthesia Postprocedure Evaluation (Signed)
Anesthesia Post Note  Patient: Tiffany [name]  Procedure(s) Performed: Procedure(s) (LRB): PACEMAKER LEAD REMOVAL with placement of temporary pacing wire (N/A)  Patient location during evaluation: PACU Anesthesia Type: General Level of consciousness: awake and alert Pain management: pain level controlled Vital Signs Assessment: post-procedure vital signs reviewed and stable Respiratory status: spontaneous breathing, nonlabored ventilation, respiratory function stable and patient connected to nasal cannula oxygen Cardiovascular status: blood pressure returned to baseline and stable Postop Assessment: no signs of nausea or vomiting Anesthetic complications: no    Last Vitals:  Filed Vitals:   07/11/15 1435 07/11/15 2051  BP: 208/90   Pulse: 89   Temp: 36.4 C 36.6 C  Resp: 16     Last Pain: There were no vitals filed for this visit.               Harryette Shuart DAVID

## 2015-07-12 ENCOUNTER — Encounter (HOSPITAL_COMMUNITY): Payer: Self-pay | Admitting: Internal Medicine

## 2015-07-12 DIAGNOSIS — I5022 Chronic systolic (congestive) heart failure: Secondary | ICD-10-CM

## 2015-07-12 LAB — BASIC METABOLIC PANEL
Anion gap: 12 (ref 5–15)
BUN: 17 mg/dL (ref 6–20)
CHLORIDE: 104 mmol/L (ref 101–111)
CO2: 24 mmol/L (ref 22–32)
Calcium: 8.9 mg/dL (ref 8.9–10.3)
Creatinine, Ser: 1.5 mg/dL — ABNORMAL HIGH (ref 0.44–1.00)
GFR calc non Af Amer: 29 mL/min — ABNORMAL LOW (ref >60)
GFR, EST AFRICAN AMERICAN: 34 mL/min — AB (ref >60)
Glucose, Bld: 152 mg/dL — ABNORMAL HIGH (ref 65–99)
POTASSIUM: 3.9 mmol/L (ref 3.5–5.1)
SODIUM: 140 mmol/L (ref 135–145)

## 2015-07-12 LAB — CBC
HEMATOCRIT: 32.7 % — AB (ref 36.0–46.0)
HEMOGLOBIN: 10.9 g/dL — AB (ref 12.0–15.0)
MCH: 32.2 pg (ref 26.0–34.0)
MCHC: 33.3 g/dL (ref 30.0–36.0)
MCV: 96.5 fL (ref 78.0–100.0)
Platelets: 123 10*3/uL — ABNORMAL LOW (ref 150–400)
RBC: 3.39 MIL/uL — AB (ref 3.87–5.11)
RDW: 13.5 % (ref 11.5–15.5)
WBC: 11.7 10*3/uL — ABNORMAL HIGH (ref 4.0–10.5)

## 2015-07-12 LAB — MRSA PCR SCREENING: MRSA BY PCR: NEGATIVE

## 2015-07-12 MED ORDER — SODIUM CHLORIDE 0.9 % IV SOLN
INTRAVENOUS | Status: DC
  Administered 2015-07-12: 16:00:00 via INTRAVENOUS

## 2015-07-12 MED ORDER — SODIUM CHLORIDE 0.9 % IV SOLN
INTRAVENOUS | Status: DC
  Administered 2015-07-12 – 2015-07-18 (×8): via INTRAVENOUS

## 2015-07-12 NOTE — Interval H&P Note (Signed)
History and Physical Interval Note:  07/12/2015 1:29 PM  Tiffany [name]  has presented today for surgery, with the diagnosis of Pacing system infection  The various methods of treatment have been discussed with the patient and family. After consideration of risks, benefits and other options for treatment, the patient has consented to  Procedure(s): PACEMAKER LEAD REMOVAL with placement of temporary pacing wire (N/A) as a surgical intervention .  The patient's history has been reviewed, patient examined, no change in status, stable for surgery.  I have reviewed the patient's chart and labs.  Questions were answered to the patient's satisfaction.     Lewayne Bunting

## 2015-07-12 NOTE — Progress Notes (Signed)
Right femoral temporary pacer removed per orders Pressure held for approximately 10 min with no bleeding or hematoma noted. Site is currently level zero with pressure dressing intact. Patient instructed on restrictions. Will continue to monitor site

## 2015-07-12 NOTE — Progress Notes (Signed)
Utilization Review Completed.Melinda Gwinner T2/21/2017  

## 2015-07-12 NOTE — Progress Notes (Signed)
Patient ID: Tiffany [name], female   DOB: 1922/08/04, 82 y.o.   MRN: [id number]    Patient Name: Tiffany [name] Date of Encounter: 07/12/2015     Active Problems:   Complete heart block (HCC)    SUBJECTIVE  No chest pain. Sore. Denies sob.  CURRENT MEDS . sodium chloride   Intravenous Once  .  ceFAZolin (ANCEF) IV  1 g Intravenous Q6H  . heparin subcutaneous  5,000 Units Subcutaneous 3 times per day  . traMADol  50 mg Oral 4 times per day    OBJECTIVE  Filed Vitals:   07/12/15 0500 07/12/15 0600 07/12/15 0700 07/12/15 0746  BP:      Pulse: 79 78 79   Temp:    97.4 F (36.3 C)  TempSrc:    Oral  Resp: Height:      Weight:      SpO2: 100% 98% 99%     Intake/Output Summary (Last 24 hours) at 07/12/15 0834 Last data filed at 07/12/15 0700  Gross per 24 hour  Intake   2485 ml  Output   1360 ml  Net   1125 ml   Filed Weights   07/11/15 1350  Weight: 160 lb (72.576 kg)    PHYSICAL EXAM  General: Pleasant, elderly woman, NAD. Neuro: Alert and oriented X 3. Moves all extremities spontaneously. Psych: Normal affect. HEENT:  Normal  Neck: Supple without bruits or JVD. Lungs:  Resp regular and unlabored, CTA. Heart: RRR no s3, s4, or murmurs. Abdomen: Soft, non-tender, non-distended, BS + x 4.  Extremities: No clubbing, cyanosis or edema. DP/PT/Radials 2+ and equal bilaterally. Art line in place in left arm and temporary PM in place in right groin.  Accessory Clinical Findings  CBC  Recent Labs  07/11/15 1426 07/12/15 0440  WBC 8.8 11.7*  HGB 12.7 10.9*  HCT 37.2 32.7*  MCV 98.4 96.5  PLT 133* 123*   Basic Metabolic Panel  Recent Labs  07/11/15 1426 07/12/15 0440  NA 142 140  K 3.5 3.9  CL 104 104  CO2 26 24  GLUCOSE 107* 152*  BUN 15 17  CREATININE 1.08* 1.50*  CALCIUM 9.6 8.9   Liver Function Tests No results for input(s): AST, ALT, ALKPHOS, BILITOT, PROT, ALBUMIN in the last 72 hours. No results for input(s): LIPASE, AMYLASE in  the last 72 hours. Cardiac Enzymes No results for input(s): CKTOTAL, CKMB, CKMBINDEX, TROPONINI in the last 72 hours. BNP Invalid input(s): POCBNP D-Dimer No results for input(s): DDIMER in the last 72 hours. Hemoglobin A1C No results for input(s): HGBA1C in the last 72 hours. Fasting Lipid Panel No results for input(s): CHOL, HDL, LDLCALC, TRIG, CHOLHDL, LDLDIRECT in the last 72 hours. Thyroid Function Tests No results for input(s): TSH, T4TOTAL, T3FREE, THYROIDAB in the last 72 hours.  Invalid input(s): FREET3  TELE  Ventricular pacing at 80/min  Radiology/Studies  Dg Chest Port 1 View  07/11/2015  CLINICAL DATA:  New pacemaker insertion. EXAM: PORTABLE CHEST 1 VIEW COMPARISON:  03/27/2013 FINDINGS: The heart is enlarged but stable. The prior pacemaker and pacer wires have been removed. There is a new single right ventricular pacer wire via a right internal jugular approach. There is also a femoral vein catheter coursing up into the right atrium and right ventricle. IMPRESSION: Removal of prior ventricular pacer wires and placement of a single right ventricular pacer wire. A femoral line is seen extending into the right ventricle. Electronically Signed  By: Rudie Meyer M.D.   On: 07/11/2015 21:44    ASSESSMENT AND PLAN  1. PPM system infection - s/p extraction with removal of all hardware including the ICD lead previously abandoned. 2. CHB - she is s/p insertion of a temporary permanent PPM via the right IJ. Her temporary PM was removed from the right femoral vein by me this morning.  3. Chronic systolic heart failure - she appears euvolemic. Continue current meds. Will remove invasive arterial monitoring 4. Venous access - will plan to have her undergo a right upper extremity venogram.  Arvis Zwahlen,M.D.  07/12/2015 8:34 AM

## 2015-07-12 NOTE — Progress Notes (Signed)
Urine output total of 50cc this shift. Patient has poor oral intake. Bladder scan shows no urine in bladder. Tiffany Molly NP made aware with new orders received.

## 2015-07-12 NOTE — H&P (View-Only) (Signed)
Patient ID: Tiffany [name], female   DOB: 07/15/1922, 82 y.o.   MRN: [id number]    Patient Name: Tiffany [name] Date of Encounter: 07/12/2015     Active Problems:   Complete heart block (HCC)    SUBJECTIVE  No chest pain. Sore. Denies sob.  CURRENT MEDS . sodium chloride   Intravenous Once  .  ceFAZolin (ANCEF) IV  1 g Intravenous Q6H  . heparin subcutaneous  5,000 Units Subcutaneous 3 times per day  . traMADol  50 mg Oral 4 times per day    OBJECTIVE  Filed Vitals:   07/12/15 0500 07/12/15 0600 07/12/15 0700 07/12/15 0746  BP:      Pulse: 79 78 79   Temp:    97.4 F (36.3 C)  TempSrc:    Oral  Resp: 23 13 18   Height:      Weight:      SpO2: 100% 98% 99%     Intake/Output Summary (Last 24 hours) at 07/12/15 0834 Last data filed at 07/12/15 0700  Gross per 24 hour  Intake   2485 ml  Output   1360 ml  Net   1125 ml   Filed Weights   07/11/15 1350  Weight: 160 lb (72.576 kg)    PHYSICAL EXAM  General: Pleasant, elderly woman, NAD. Neuro: Alert and oriented X 3. Moves all extremities spontaneously. Psych: Normal affect. HEENT:  Normal  Neck: Supple without bruits or JVD. Lungs:  Resp regular and unlabored, CTA. Heart: RRR no s3, s4, or murmurs. Abdomen: Soft, non-tender, non-distended, BS + x 4.  Extremities: No clubbing, cyanosis or edema. DP/PT/Radials 2+ and equal bilaterally. Art line in place in left arm and temporary PM in place in right groin.  Accessory Clinical Findings  CBC  Recent Labs  07/11/15 1426 07/12/15 0440  WBC 8.8 11.7*  HGB 12.7 10.9*  HCT 37.2 32.7*  MCV 98.4 96.5  PLT 133* 123*   Basic Metabolic Panel  Recent Labs  07/11/15 1426 07/12/15 0440  NA 142 140  K 3.5 3.9  CL 104 104  CO2 26 24  GLUCOSE 107* 152*  BUN 15 17  CREATININE 1.08* 1.50*  CALCIUM 9.6 8.9   Liver Function Tests No results for input(s): AST, ALT, ALKPHOS, BILITOT, PROT, ALBUMIN in the last 72 hours. No results for input(s): LIPASE, AMYLASE in  the last 72 hours. Cardiac Enzymes No results for input(s): CKTOTAL, CKMB, CKMBINDEX, TROPONINI in the last 72 hours. BNP Invalid input(s): POCBNP D-Dimer No results for input(s): DDIMER in the last 72 hours. Hemoglobin A1C No results for input(s): HGBA1C in the last 72 hours. Fasting Lipid Panel No results for input(s): CHOL, HDL, LDLCALC, TRIG, CHOLHDL, LDLDIRECT in the last 72 hours. Thyroid Function Tests No results for input(s): TSH, T4TOTAL, T3FREE, THYROIDAB in the last 72 hours.  Invalid input(s): FREET3  TELE  Ventricular pacing at 80/min  Radiology/Studies  Dg Chest Port 1 View  07/11/2015  CLINICAL DATA:  New pacemaker insertion. EXAM: PORTABLE CHEST 1 VIEW COMPARISON:  03/27/2013 FINDINGS: The heart is enlarged but stable. The prior pacemaker and pacer wires have been removed. There is a new single right ventricular pacer wire via a right internal jugular approach. There is also a femoral vein catheter coursing up into the right atrium and right ventricle. IMPRESSION: Removal of prior ventricular pacer wires and placement of a single right ventricular pacer wire. A femoral line is seen extending into the right ventricle. Electronically Signed     By: Rudie Meyer M.D.   On: 07/11/2015 21:44    ASSESSMENT AND PLAN  1. PPM system infection - s/p extraction with removal of all hardware including the ICD lead previously abandoned. 2. CHB - she is s/p insertion of a temporary permanent PPM via the right IJ. Her temporary PM was removed from the right femoral vein by me this morning.  3. Chronic systolic heart failure - she appears euvolemic. Continue current meds. Will remove invasive arterial monitoring 4. Venous access - will plan to have her undergo a right upper extremity venogram.  Gregg Taylor,M.D.  07/12/2015 8:34 AM

## 2015-07-13 DIAGNOSIS — N179 Acute kidney failure, unspecified: Secondary | ICD-10-CM

## 2015-07-13 LAB — BASIC METABOLIC PANEL
ANION GAP: 12 (ref 5–15)
BUN: 33 mg/dL — ABNORMAL HIGH (ref 6–20)
CALCIUM: 8.4 mg/dL — AB (ref 8.9–10.3)
CO2: 26 mmol/L (ref 22–32)
Chloride: 100 mmol/L — ABNORMAL LOW (ref 101–111)
Creatinine, Ser: 2.48 mg/dL — ABNORMAL HIGH (ref 0.44–1.00)
GFR calc Af Amer: 18 mL/min — ABNORMAL LOW (ref >60)
GFR, EST NON AFRICAN AMERICAN: 16 mL/min — AB (ref >60)
GLUCOSE: 127 mg/dL — AB (ref 65–99)
POTASSIUM: 3.8 mmol/L (ref 3.5–5.1)
SODIUM: 138 mmol/L (ref 135–145)

## 2015-07-13 LAB — CBC WITH DIFFERENTIAL/PLATELET
BASOS ABS: 0 10*3/uL (ref 0.0–0.1)
BASOS PCT: 0 %
EOS PCT: 1 %
Eosinophils Absolute: 0.1 10*3/uL (ref 0.0–0.7)
HEMATOCRIT: 26 % — AB (ref 36.0–46.0)
Hemoglobin: 8.6 g/dL — ABNORMAL LOW (ref 12.0–15.0)
LYMPHS PCT: 23 %
Lymphs Abs: 2.5 10*3/uL (ref 0.7–4.0)
MCH: 32.5 pg (ref 26.0–34.0)
MCHC: 33.1 g/dL (ref 30.0–36.0)
MCV: 98.1 fL (ref 78.0–100.0)
MONO ABS: 1 10*3/uL (ref 0.1–1.0)
Monocytes Relative: 10 %
NEUTROS ABS: 7 10*3/uL (ref 1.7–7.7)
Neutrophils Relative %: 66 %
PLATELETS: 95 10*3/uL — AB (ref 150–400)
RBC: 2.65 MIL/uL — AB (ref 3.87–5.11)
RDW: 13.8 % (ref 11.5–15.5)
WBC: 10.6 10*3/uL — AB (ref 4.0–10.5)

## 2015-07-13 NOTE — Clinical Documentation Improvement (Signed)
Cardiology  (Query responses must be documented in the current medical record, not on the [facility] BPA form.)  Please document if a condition below provides greater specificity regarding the patient's renal function this admission:   -Acute Tubular Necrosis  - Acute Kidney Injury  - Other condition  - Unable to clinically determine  Clinical Information /Indicators: "Worsening renal dysfunction, reduce urine output - this is likely due to prolonged procedure with transient hypotension and ionotropic agents given during general anesthesia. She did not receive any IV dye." documented by Dr. Beckie Salts 07/13/15 at 7:17 am (progress note)  BUN/Cr/GFR trend this admission  (white female) Component     Latest Ref Rng 07/11/2015 07/12/2015 07/13/2015  BUN     6 - 20 mg/dL 15 17 33 (H)  Creatinine     0.44 - 1.00 mg/dL 1.08 (H) 1.50 (H) 2.48 (H)  EGFR (Non-African Amer.)     >60 mL/min 44 (L) 29 (L) 16 (L)    Please exercise your independent, professional judgment when responding. A specific answer is not anticipated or expected.   Thank You, Erling Conte  RN BSN CCDS [phone] Health Information Management [place]

## 2015-07-13 NOTE — Progress Notes (Signed)
Patient ID: Tiffany [name], female   DOB: 1923/02/13, 82 y.o.   MRN: [id number]    Patient Name: Tiffany [name] Date of Encounter: 07/13/2015     Active Problems:   Complete heart block (HCC)    SUBJECTIVE No chest pain or sob. Incision is tender to palpation. No groin pain.  CURRENT MEDS . sodium chloride   Intravenous Once  . heparin subcutaneous  5,000 Units Subcutaneous 3 times per day  . traMADol  50 mg Oral 4 times per day    OBJECTIVE  Filed Vitals:   07/13/15 0338 07/13/15 0400 07/13/15 0500 07/13/15 0600  BP:  133/51 154/71 128/51  Pulse:  80 80 80  Temp: 98.4 F (36.9 C)     TempSrc: Oral     Resp:  19 19 19   Height:      Weight:      SpO2:  97% 96% 97%    Intake/Output Summary (Last 24 hours) at 07/13/15 0717 Last data filed at 07/13/15 0600  Gross per 24 hour  Intake 1297.5 ml  Output    640 ml  Net  657.5 ml   Filed Weights   07/11/15 1350  Weight: 160 lb (72.576 kg)    PHYSICAL EXAM  General: Pleasant, elderly woman, NAD. Neuro: Alert and oriented X 3. Moves all extremities spontaneously. Psych: Normal affect. HEENT:  Normal but hard of hearing  Neck: Supple without bruits or JVD. Lungs:  Resp regular and unlabored, CTA. Incision with dry dressing Heart: RRR no s3, s4, or murmurs. Abdomen: Soft, non-tender, non-distended, BS + x 4.  Extremities: No clubbing, cyanosis or edema. DP/PT/Radials 2+ and equal bilaterally.  Accessory Clinical Findings  CBC  Recent Labs  07/12/15 0440 07/13/15 0300  WBC 11.7* 10.6*  NEUTROABS  --  7.0  HGB 10.9* 8.6*  HCT 32.7* 26.0*  MCV 96.5 98.1  PLT 123* 95*   Basic Metabolic Panel  Recent Labs  07/12/15 0440 07/13/15 0300  NA 140 138  K 3.9 3.8  CL 104 100*  CO2 24 26  GLUCOSE 152* 127*  BUN 17 33*  CREATININE 1.50* 2.48*  CALCIUM 8.9 8.4*   Liver Function Tests No results for input(s): AST, ALT, ALKPHOS, BILITOT, PROT, ALBUMIN in the last 72 hours. No results for input(s): LIPASE,  AMYLASE in the last 72 hours. Cardiac Enzymes No results for input(s): CKTOTAL, CKMB, CKMBINDEX, TROPONINI in the last 72 hours. BNP Invalid input(s): POCBNP D-Dimer No results for input(s): DDIMER in the last 72 hours. Hemoglobin A1C No results for input(s): HGBA1C in the last 72 hours. Fasting Lipid Panel No results for input(s): CHOL, HDL, LDLCALC, TRIG, CHOLHDL, LDLDIRECT in the last 72 hours. Thyroid Function Tests No results for input(s): TSH, T4TOTAL, T3FREE, THYROIDAB in the last 72 hours.  Invalid input(s): FREET3  TELE  nsr with ventricular pacing  Radiology/Studies  Dg Chest Port 1 View  07/11/2015  CLINICAL DATA:  New pacemaker insertion. EXAM: PORTABLE CHEST 1 VIEW COMPARISON:  03/27/2013 FINDINGS: The heart is enlarged but stable. The prior pacemaker and pacer wires have been removed. There is a new single right ventricular pacer wire via a right internal jugular approach. There is also a femoral vein catheter coursing up into the right atrium and right ventricle. IMPRESSION: Removal of prior ventricular pacer wires and placement of a single right ventricular pacer wire. A femoral line is seen extending into the right ventricle. Electronically Signed   By: Rudie Meyer M.D.   On:  07/11/2015 21:44    ASSESSMENT AND PLAN  1. PM system infection, s/p extraction including ICD lead 2. Complete heart block, s/p insertion of a temporary perm TV PM 3. Worsening renal dysfunction, reduce urine output - this is likely due to prolonged procedure with transient hypotension and ionotropic agents given during general anesthesia. She did not receive any IV dye. 4. Non-ischemic CM 5. Acute blood loss anemia Rec: will encourage oral intake today. Will give IV fluids as needed. If blood count goes down further, will transfuse. She will need left upper extremity venogram but will hold off on this until her creatinine improves. PM re-insertion pending healing and kidney function. I  anticipate she will not be ready for PPM until next week and will likely need to stay in the hospital until then. She will have her packing removed today.  Teighlor Korson,M.D.  07/13/2015 7:17 AM

## 2015-07-14 ENCOUNTER — Encounter (HOSPITAL_COMMUNITY)
Admission: RE | Disposition: A | Discharge status: Home Health | Payer: Self-pay | Source: Ambulatory Visit | Attending: Internal Medicine

## 2015-07-14 DIAGNOSIS — I10 Essential (primary) hypertension: Secondary | ICD-10-CM

## 2015-07-14 DIAGNOSIS — D649 Anemia, unspecified: Secondary | ICD-10-CM

## 2015-07-14 HISTORY — PX: PERIPHERAL VASCULAR CATHETERIZATION: SHX172C

## 2015-07-14 LAB — BASIC METABOLIC PANEL
ANION GAP: 7 (ref 5–15)
BUN: 34 mg/dL — ABNORMAL HIGH (ref 6–20)
CHLORIDE: 104 mmol/L (ref 101–111)
CO2: 25 mmol/L (ref 22–32)
Calcium: 8.3 mg/dL — ABNORMAL LOW (ref 8.9–10.3)
Creatinine, Ser: 2.13 mg/dL — ABNORMAL HIGH (ref 0.44–1.00)
GFR calc Af Amer: 22 mL/min — ABNORMAL LOW (ref >60)
GFR calc non Af Amer: 19 mL/min — ABNORMAL LOW (ref >60)
GLUCOSE: 106 mg/dL — AB (ref 65–99)
POTASSIUM: 4.5 mmol/L (ref 3.5–5.1)
Sodium: 136 mmol/L (ref 135–145)

## 2015-07-14 LAB — CBC WITH DIFFERENTIAL/PLATELET
Basophils Absolute: 0 10*3/uL (ref 0.0–0.1)
Basophils Relative: 0 %
Eosinophils Absolute: 0.2 10*3/uL (ref 0.0–0.7)
Eosinophils Relative: 3 %
HEMATOCRIT: 25.7 % — AB (ref 36.0–46.0)
Hemoglobin: 8.5 g/dL — ABNORMAL LOW (ref 12.0–15.0)
LYMPHS PCT: 23 %
Lymphs Abs: 2 10*3/uL (ref 0.7–4.0)
MCH: 32.6 pg (ref 26.0–34.0)
MCHC: 33.1 g/dL (ref 30.0–36.0)
MCV: 98.5 fL (ref 78.0–100.0)
MONO ABS: 0.7 10*3/uL (ref 0.1–1.0)
MONOS PCT: 8 %
NEUTROS ABS: 5.6 10*3/uL (ref 1.7–7.7)
Neutrophils Relative %: 66 %
PLATELETS: 114 10*3/uL — AB (ref 150–400)
RBC: 2.61 MIL/uL — ABNORMAL LOW (ref 3.87–5.11)
RDW: 14 % (ref 11.5–15.5)
WBC: 8.5 10*3/uL (ref 4.0–10.5)

## 2015-07-14 SURGERY — UPPER EXTREMITY VENOGRAPHY

## 2015-07-14 MED ORDER — IOHEXOL 350 MG/ML SOLN
INTRAVENOUS | Status: DC | PRN
  Administered 2015-07-14: 30 mL via INTRAVENOUS

## 2015-07-14 SURGICAL SUPPLY — 4 items
KIT PV (KITS) ×3 IMPLANT
SYR MEDRAD MARK V 150ML (SYRINGE) ×3 IMPLANT
TRANSDUCER W/STOPCOCK (MISCELLANEOUS) ×3 IMPLANT
TRAY PV CATH (CUSTOM PROCEDURE TRAY) ×3 IMPLANT

## 2015-07-14 NOTE — Progress Notes (Signed)
Patient c/o fullness in bladder. Bladder scan >600 ml. Foley irrigated with approx 200 ml returned. Irrigated again with approx 200 ml returned again. Patient reports feels better but still with fullness. Repeat Bladder scan >400 ml. Discussed with patient. Foley d/c and will monitor for urine output.

## 2015-07-14 NOTE — Progress Notes (Signed)
Patient c/o bladder feeling full.  Place on bedpan and UOP was 125ccs.  Patient still c/o of "feeling full like my bladder is going to pop".  Notified Amber, N.P. of patient's complaint.  Order given and initated for I/O cath then foley if c/o feeling full again.  S/P I/O cath 1050ccs clear, yellow urine from bladder.  Patient tolerated well and resting comfortably.

## 2015-07-14 NOTE — Progress Notes (Signed)
Left upper extremity venogram performed, shows open left axillary vein.  Loman Brooklyn, MD 07/14/2015

## 2015-07-14 NOTE — Care Management Important Message (Signed)
Important Message  Patient Details  Name: [name] MRN: [id number] Date of Birth: 07/16/1922   Medicare Important Message Given:  Yes    Oralia Rud Psalm Arman 07/14/2015, 4:31 PM

## 2015-07-14 NOTE — Progress Notes (Signed)
Patient ID: Tiffany [name], female   DOB: 1923/04/15, 82 y.o.   MRN: [id number]    Patient Name: Tiffany [name] Date of Encounter: 07/14/2015     Active Problems:   Complete heart block (HCC)    SUBJECTIVE  No chest pain or sob.   CURRENT MEDS . sodium chloride   Intravenous Once  . heparin subcutaneous  5,000 Units Subcutaneous 3 times per day  . traMADol  50 mg Oral 4 times per day    OBJECTIVE  Filed Vitals:   07/14/15 0900 07/14/15 1000 07/14/15 1100 07/14/15 1130  BP:  185/71 170/68   Pulse: 79 79 79   Temp:    98.3 F (36.8 C)  TempSrc:    Oral  Resp: Height:      Weight:      SpO2: 93% 97% 93%     Intake/Output Summary (Last 24 hours) at 07/14/15 1410 Last data filed at 07/14/15 1400  Gross per 24 hour  Intake   2400 ml  Output   1245 ml  Net   1155 ml   Filed Weights   07/11/15 1350  Weight: 160 lb (72.576 kg)    PHYSICAL EXAM  General: Pleasant, NAD. Neuro: Alert and oriented X 3. Moves all extremities spontaneously. Psych: Normal affect. HEENT:  Normal  Neck: Supple without bruits or JVD. Lungs:  Resp regular and unlabored, CTA. Heart: RRR no s3, s4, or murmurs. Abdomen: Soft, non-tender, non-distended, BS + x 4.  Extremities: No clubbing, cyanosis or edema. DP/PT/Radials 2+ and equal bilaterally.  Accessory Clinical Findings  CBC  Recent Labs  07/13/15 0300 07/14/15 0445  WBC 10.6* 8.5  NEUTROABS 7.0 5.6  HGB 8.6* 8.5*  HCT 26.0* 25.7*  MCV 98.1 98.5  PLT 95* 114*   Basic Metabolic Panel  Recent Labs  07/13/15 0300 07/14/15 0445  NA 138 136  K 3.8 4.5  CL 100* 104  CO2 26 25  GLUCOSE 127* 106*  BUN 33* 34*  CREATININE 2.48* 2.13*  CALCIUM 8.4* 8.3*   Liver Function Tests No results for input(s): AST, ALT, ALKPHOS, BILITOT, PROT, ALBUMIN in the last 72 hours. No results for input(s): LIPASE, AMYLASE in the last 72 hours. Cardiac Enzymes No results for input(s): CKTOTAL, CKMB, CKMBINDEX, TROPONINI in the last  72 hours. BNP Invalid input(s): POCBNP D-Dimer No results for input(s): DDIMER in the last 72 hours. Hemoglobin A1C No results for input(s): HGBA1C in the last 72 hours. Fasting Lipid Panel No results for input(s): CHOL, HDL, LDLCALC, TRIG, CHOLHDL, LDLDIRECT in the last 72 hours. Thyroid Function Tests No results for input(s): TSH, T4TOTAL, T3FREE, THYROIDAB in the last 72 hours.  Invalid input(s): FREET3  TELE - ventricular pacing  Radiology/Studies  Dg Chest Port 1 View  07/11/2015  CLINICAL DATA:  New pacemaker insertion. EXAM: PORTABLE CHEST 1 VIEW COMPARISON:  03/27/2013 FINDINGS: The heart is enlarged but stable. The prior pacemaker and pacer wires have been removed. There is a new single right ventricular pacer wire via a right internal jugular approach. There is also a femoral vein catheter coursing up into the right atrium and right ventricle. IMPRESSION: Removal of prior ventricular pacer wires and placement of a single right ventricular pacer wire. A femoral line is seen extending into the right ventricle. Electronically Signed   By: Rudie Meyer M.D.   On: 07/11/2015 21:44    ASSESSMENT AND PLAN  1. Pacemaker system infection - she is doing well  s/p extraction.  2. Complete heart block - she is tolerating her temporary perm PM. Will plan on placing a new PM next week.  3. HTN -Her blood pressure is controlled. 4. Acute kidney injury - she is making urine and her creatinine is better. Will plan to obtain a left upper extremity venogram 5. Acute blood loss anemia - her H/H are stable. Will follow.  Lewayne Bunting, M.D.  07/14/2015 2:10 PM

## 2015-07-15 ENCOUNTER — Encounter (HOSPITAL_COMMUNITY): Payer: Self-pay | Admitting: Cardiology

## 2015-07-15 DIAGNOSIS — T827XXA Infection and inflammatory reaction due to other cardiac and vascular devices, implants and grafts, initial encounter: Secondary | ICD-10-CM | POA: Insufficient documentation

## 2015-07-15 DIAGNOSIS — B9689 Other specified bacterial agents as the cause of diseases classified elsewhere: Secondary | ICD-10-CM

## 2015-07-15 DIAGNOSIS — F03918 Unspecified dementia, unspecified severity, with other behavioral disturbance: Secondary | ICD-10-CM | POA: Insufficient documentation

## 2015-07-15 DIAGNOSIS — Y838 Other surgical procedures as the cause of abnormal reaction of the patient, or of later complication, without mention of misadventure at the time of the procedure: Secondary | ICD-10-CM

## 2015-07-15 DIAGNOSIS — N189 Chronic kidney disease, unspecified: Secondary | ICD-10-CM

## 2015-07-15 DIAGNOSIS — R531 Weakness: Secondary | ICD-10-CM

## 2015-07-15 DIAGNOSIS — F0391 Unspecified dementia with behavioral disturbance: Secondary | ICD-10-CM | POA: Insufficient documentation

## 2015-07-15 LAB — BASIC METABOLIC PANEL
ANION GAP: 8 (ref 5–15)
BUN: 28 mg/dL — AB (ref 6–20)
CALCIUM: 8.2 mg/dL — AB (ref 8.9–10.3)
CO2: 24 mmol/L (ref 22–32)
CREATININE: 1.68 mg/dL — AB (ref 0.44–1.00)
Chloride: 103 mmol/L (ref 101–111)
GFR calc Af Amer: 30 mL/min — ABNORMAL LOW (ref >60)
GFR, EST NON AFRICAN AMERICAN: 26 mL/min — AB (ref >60)
GLUCOSE: 107 mg/dL — AB (ref 65–99)
Potassium: 3.8 mmol/L (ref 3.5–5.1)
Sodium: 135 mmol/L (ref 135–145)

## 2015-07-15 LAB — TYPE AND SCREEN
ABO/RH(D): B POS
Antibody Screen: NEGATIVE
UNIT DIVISION: 0
UNIT DIVISION: 0

## 2015-07-15 MED ORDER — TRAMADOL HCL 50 MG PO TABS
50.0000 mg | ORAL_TABLET | Freq: Four times a day (QID) | ORAL | Status: DC | PRN
  Administered 2015-07-15 – 2015-07-20 (×10): 50 mg via ORAL
  Filled 2015-07-15 (×10): qty 1

## 2015-07-15 MED ORDER — DEXTROSE 5 % IV SOLN
2.0000 g | INTRAVENOUS | Status: DC
  Administered 2015-07-15 – 2015-07-17 (×3): 2 g via INTRAVENOUS
  Filled 2015-07-15 (×4): qty 2

## 2015-07-15 MED ORDER — SODIUM CHLORIDE 0.9 % IV SOLN
580.0000 mg | INTRAVENOUS | Status: DC
  Administered 2015-07-15 – 2015-07-17 (×2): 580 mg via INTRAVENOUS
  Filled 2015-07-15 (×4): qty 11.6

## 2015-07-15 NOTE — Progress Notes (Signed)
Patient ID: [name], female   DOB: [birth year], 82 y.o.   MRN: [id number]    Patient Name: Tiffany [name] Date of Encounter: 07/15/2015     Active Problems:   Complete heart block (HCC)    SUBJECTIVE  No chest pain or sob. C/o feeling weak.   CURRENT MEDS . heparin subcutaneous  5,000 Units Subcutaneous 3 times per day  . traMADol  50 mg Oral 4 times per day    OBJECTIVE  Filed Vitals:   07/15/15 0400 07/15/15 0500 07/15/15 0600 07/15/15 0700  BP: 173/58 158/62 151/69 171/58  Pulse: 79 80 80 79  Temp: 97.8 F (36.6 C)     TempSrc: Oral     Resp: 22 21 26 27   Height:      Weight:      SpO2: 96% 95% 94% 95%    Intake/Output Summary (Last 24 hours) at 07/15/15 0737 Last data filed at 07/15/15 0700  Gross per 24 hour  Intake   2150 ml  Output   2625 ml  Net   -475 ml   Filed Weights   07/11/15 1350  Weight: 160 lb (72.576 kg)    PHYSICAL EXAM  General: Pleasant, NAD. Neuro: Alert and oriented X 3. Moves all extremities spontaneously. Psych: Normal affect. HEENT:  Normal  Neck: Supple without bruits or JVD. Lungs:  Resp regular and unlabored, CTA. Heart: RRR no s3, s4, or murmurs. Abdomen: Soft, non-tender, non-distended, BS + x 4.  Extremities: No clubbing, cyanosis or edema. DP/PT/Radials 2+ and equal bilaterally.  Accessory Clinical Findings  CBC  Recent Labs  07/13/15 0300 07/14/15 0445  WBC 10.6* 8.5  NEUTROABS 7.0 5.6  HGB 8.6* 8.5*  HCT 26.0* 25.7*  MCV 98.1 98.5  PLT 95* 114*   Basic Metabolic Panel  Recent Labs  07/13/15 0300 07/14/15 0445  NA 138 136  K 3.8 4.5  CL 100* 104  CO2 26 25  GLUCOSE 127* 106*  BUN 33* 34*  CREATININE 2.48* 2.13*  CALCIUM 8.4* 8.3*   Liver Function Tests No results for input(s): AST, ALT, ALKPHOS, BILITOT, PROT, ALBUMIN in the last 72 hours. No results for input(s): LIPASE, AMYLASE in the last 72 hours. Cardiac Enzymes No results for input(s): CKTOTAL, CKMB, CKMBINDEX, TROPONINI in the last  72 hours. BNP Invalid input(s): POCBNP D-Dimer No results for input(s): DDIMER in the last 72 hours. Hemoglobin A1C No results for input(s): HGBA1C in the last 72 hours. Fasting Lipid Panel No results for input(s): CHOL, HDL, LDLCALC, TRIG, CHOLHDL, LDLDIRECT in the last 72 hours. Thyroid Function Tests No results for input(s): TSH, T4TOTAL, T3FREE, THYROIDAB in the last 72 hours.  Invalid input(s): FREET3  TELE  Ventricular pacing  Radiology/Studies  Dg Chest Port 1 View  07/11/2015  CLINICAL DATA:  New pacemaker insertion. EXAM: PORTABLE CHEST 1 VIEW COMPARISON:  03/27/2013 FINDINGS: The heart is enlarged but stable. The prior pacemaker and pacer wires have been removed. There is a new single right ventricular pacer wire via a right internal jugular approach. There is also a femoral vein catheter coursing up into the right atrium and right ventricle. IMPRESSION: Removal of prior ventricular pacer wires and placement of a single right ventricular pacer wire. A femoral line is seen extending into the right ventricle. Electronically Signed   By: Rudie Meyer M.D.   On: 07/11/2015 21:44    ASSESSMENT AND PLAN  1. PM system infection - s/p extraction 2. CHB - she is s/p placement  of a temp perm PPM via the right IJ. She had a venogram and her left side is open. Will plan for placement of left sided PPM on Monday. Ideally we would wait until her right sided implant was completely healed before placing the new device but her total PM dependence will prevent [place] from allowing her to leave and come back. She has had no fever, normal WBC, she has had no systemic symptoms, and her old implant site is healing nicely. Will plan for contralateral implant on Monday. 3. Weakness - nursed tried to get her up yesterday and she is very weak from being in the bed and being 89. At home, she was ambulatory. Will ask PT/OT to see. I suspect she may need a SNF after her PPM is placed. 4. Acute on chronic  renal failure - her creatinine had improved yesterday, and is pending today.    Harleyquinn Gasser,M.D.  07/15/2015 7:37 AM

## 2015-07-15 NOTE — Consult Note (Signed)
Date of Admission:  07/11/2015  Date of Consult:  07/15/2015  Reason for Consult: Infected pacemaker Referring Physician: Dr. Crissie Sickles   HPI: Tiffany [name] is an 82 y.o. female. h/o complete heart block, chronic systolic heart failure and multiple lead issues. Her pacing system was initially placed in 1996, with upgrade to a dual-chamber ICD, followed by a biventricular pacemaker. Her pacing/sense portion of her ICD lead has failed. She has an elevated left ventricular pacing threshold. Her right atrial lead does not pace but does sense. With all this she underwent pacemaker revision but after 2 months developed thinning of the skin and pending erosion and underwent a salvage procedure over a year ago. She had no drainage or problem with her device until roughly Valentine's days ago when her skin suddenly began to swell and the skin thin and eroded through the skin. She had not been ill. No fever or chills. Dr. Lovena Le started on oral Keflex twice daily as an outpatient and then ultimately the patient was brought into the hospital on February 17 and continued on antibiotics in the form of cefazolin. She went to the operating theater and on February 20:  Successful removal of a dual coil ICD lead, a left ventricular pacing lead, and 2 old atrial and ventricular past fixation pacing leads with successful insertion of a temporary permanent single-chamber pacemaker.  She was then taken off cefazolin and blood cultures were obtained. There is plans to insert a permanent pacemaker on Monday. We are consulted with regards to management of this pacemaker infection.  Past Medical History  Diagnosis Date  . Status post biventricular pacemaker     with AICD; 2004; revision 2007  . Complete heart block ([place])   . Nonischemic cardiomyopathy ([place])     EF 45-50% in 2009  . Systolic heart failure   . Macular degeneration   . Ventricular tachycardia ([place])   . Arthritis   . Glaucoma   .  Hyperlipidemia   . Essential hypertension, benign   . Osteoporosis   . Allergy   . Impaired glucose tolerance 02/02/2011  . Vitamin D deficiency 02/02/2011  . Hearing loss 02/02/2011  . Shortness of breath dyspnea     with exertion  . Hypothyroidism     not currently  . Depression     occasional- situational  . HOH (hard of hearing)     Past Surgical History  Procedure Laterality Date  . Bi-ventricular pacemaker revision (crt-r)  03-08-2014    MDT CRTP generator change with use of previously abandoned RV lead due to ICD RV lead failure  . Cholecystectomy    . Appendectomy    . Tonsillectomy    . Cataract surgery    . Bi-ventricular pacemaker revision N/A 03/08/2014    Procedure: BI-VENTRICULAR PACEMAKER REVISION (CRT-R);  Surgeon: Evans Lance, MD;  Location[facility] C[facility] LAB;  Service: Cardiovascular;  Laterality: N/A;  . Bi-ventricular pacemaker revision N/A 06/16/2014    Procedure: BI-VENTRICULAR PACEMAKER REVISION (CRT-R);  Surgeon: Evans Lance, MD;  Location: [facility] CATH LAB;  Service: Cardiovascular;  Laterality: N/A;  . Peripheral vascular catheterization N/A 07/14/2015    Procedure: Upper Extremity Venography / Left;  Surgeon: Will Meredith Leeds, MD;  Location: [facility] CV LAB;  Service: Cardiovascular;  Laterality: N/A;  . Pacemaker lead removal N/A 07/11/2015    Procedure: PACEMAKER LEAD REMOVAL with placement of temporary pacing wire;  Surgeon: Evans Lance, MD;  Location: [facility];  Service: Cardiovascular;  Laterality: N/A;    Social History:  reports that she has never smoked. She has never used smokeless tobacco. She reports that she does not drink alcohol or use illicit drugs.   Family History  Problem Relation Age of Onset  . Hypertension Mother   . Coronary artery disease Mother   . Arthritis Other   . Cancer Other     lung cancer  . Heart disease Other   . Hypertension Other     No Known Allergies   Medications: I have reviewed patients current  medications as documented in Epic Anti-infectives    Start     Dose/Rate Route Frequency Ordered Stop   07/15/15 1200  DAPTOmycin (CUBICIN) 580 mg in sodium chloride 0.9 % IVPB     580 mg 223.2 mL/hr over 30 Minutes Intravenous Every 48 hours 07/15/15 1041     07/12/15 0015  ceFAZolin (ANCEF) IVPB 1 g/50 mL premix     1 g 100 mL/hr over 30 Minutes Intravenous Every 6 hours 07/11/15 2144 07/12/15 1232   07/11/15 1709  gentamicin (GARAMYCIN) 80 mg in sodium chloride irrigation 0.9 % 500 mL irrigation  Status:  Discontinued       As needed 07/11/15 1709 07/11/15 2044   07/11/15 1615  gentamicin (GARAMYCIN) 80 mg in sodium chloride irrigation 0.9 % 500 mL irrigation  Status:  Discontinued      Irrigation  Once 07/11/15 1607 07/11/15 2144   07/11/15 1441  ceFAZolin (ANCEF) 2-3 GM-% IVPB SOLR    Comments:  Henrine Screws   : cabinet override      07/11/15 1441 07/11/15 1646   07/10/15 0930  gentamicin (GARAMYCIN) 80 mg in sodium chloride irrigation 0.9 % 500 mL irrigation  Status:  Discontinued     80 mg Irrigation On call 07/10/15 0918 07/11/15 2144   07/10/15 0930  ceFAZolin (ANCEF) IVPB 2 g/50 mL premix  Status:  Discontinued     2 g 100 mL/hr over 30 Minutes Intravenous On call 07/10/15 0918 07/11/15 2144         ROS:  as in HPI otherwise remainder of 12 point Review of Systems is not obtainable due to patient's confusion   Blood pressure 128/61, pulse 77, temperature 97.7 F (36.5 C), temperature source Oral, resp. rate 15, height 5' 7"  (1.702 m), weight 160 lb (72.576 kg), SpO2 93 %. General: Alert and awake, oriented being in the hospital and to her identity but she cannot recall much at all about her pacemaker.  HEENT: anicteric sclera,  EOMI, oropharynx clear and without exudate Cardiovascular: regular rate, normal r,  no murmur rubs or gallops Pulmonary: clear to auscultation bilaterally, no wheezing, rales or rhonchi Gastrointestinal: soft nontender, nondistended, normal  bowel sounds, Musculoskeletal: no  clubbing or edema noted bilaterally Skin, bandage over area of PM extraction Neuro: nonfocal, strength and sensation intact   Results for orders placed or performed during the hospital encounter of 07/11/15 (from the past 48 hour(s))  Basic metabolic panel     Status: Abnormal   Collection Time: 07/14/15  4:45 AM  Result Value Ref Range   Sodium 136 135 - 145 mmol/L   Potassium 4.5 3.5 - 5.1 mmol/L   Chloride 104 101 - 111 mmol/L   CO2 25 22 - 32 mmol/L   Glucose, Bld 106 (H) 65 - 99 mg/dL   BUN 34 (H) 6 - 20 mg/dL   Creatinine, Ser 2.13 (H) 0.44 - 1.00 mg/dL   Calcium  8.3 (L) 8.9 - 10.3 mg/dL   GFR calc non Af Amer 19 (L) >60 mL/min   GFR calc Af Amer 22 (L) >60 mL/min    Comment: (NOTE) The eGFR has been calculated using the CKD EPI equation. This calculation has not been validated in all clinical situations. eGFR's persistently <60 mL/min signify possible Chronic Kidney Disease.    Anion gap 7 5 - 15  CBC with Differential/Platelet     Status: Abnormal   Collection Time: 07/14/15  4:45 AM  Result Value Ref Range   WBC 8.5 4.0 - 10.5 K/uL   RBC 2.61 (L) 3.87 - 5.11 MIL/uL   Hemoglobin 8.5 (L) 12.0 - 15.0 g/dL   HCT 25.7 (L) 36.0 - 46.0 %   MCV 98.5 78.0 - 100.0 fL   MCH 32.6 26.0 - 34.0 pg   MCHC 33.1 30.0 - 36.0 g/dL   RDW 14.0 11.5 - 15.5 %   Platelets 114 (L) 150 - 400 K/uL    Comment: CONSISTENT WITH PREVIOUS RESULT   Neutrophils Relative % 66 %   Neutro Abs 5.6 1.7 - 7.7 K/uL   Lymphocytes Relative 23 %   Lymphs Abs 2.0 0.7 - 4.0 K/uL   Monocytes Relative 8 %   Monocytes Absolute 0.7 0.1 - 1.0 K/uL   Eosinophils Relative 3 %   Eosinophils Absolute 0.2 0.0 - 0.7 K/uL   Basophils Relative 0 %   Basophils Absolute 0.0 0.0 - 0.1 K/uL  Basic metabolic panel     Status: Abnormal   Collection Time: 07/15/15  6:57 AM  Result Value Ref Range   Sodium 135 135 - 145 mmol/L   Potassium 3.8 3.5 - 5.1 mmol/L   Chloride 103 101 - 111  mmol/L   CO2 24 22 - 32 mmol/L   Glucose, Bld 107 (H) 65 - 99 mg/dL   BUN 28 (H) 6 - 20 mg/dL   Creatinine, Ser 1.68 (H) 0.44 - 1.00 mg/dL   Calcium 8.2 (L) 8.9 - 10.3 mg/dL   GFR calc non Af Amer 26 (L) >60 mL/min   GFR calc Af Amer 30 (L) >60 mL/min    Comment: (NOTE) The eGFR has been calculated using the CKD EPI equation. This calculation has not been validated in all clinical situations. eGFR's persistently <60 mL/min signify possible Chronic Kidney Disease.    Anion gap 8 5 - 15   @BRIEFLABTABLE (sdes,specrequest,cult,reptstatus)   ) Recent Results (from the past 720 hour(s))  MRSA PCR Screening     Status: None   Collection Time: 07/11/15 10:00 PM  Result Value Ref Range Status   MRSA by PCR NEGATIVE NEGATIVE Final    Comment:        The GeneXpert MRSA Assay (FDA approved for NASAL specimens only), is one component of a comprehensive MRSA colonization surveillance program. It is not intended to diagnose MRSA infection nor to guide or monitor treatment for MRSA infections.      Impression/Recommendation  Active Problems:   Complete heart block (HCC)   Pacemaker infection ([place])   Tiffany [name] is a 82 y.o. female with  PM infection due to erosion of lead through skin. She has not been systemically ill and we have never had documentation of bacteremia though no blood cultures were drawn until after she had been on keflex and cefazolin  #1 PM infection that appears to be only isoalted to break down and exposure of leads which of course DID require extraction  --I will place  her on daptomycin and ceftriaxone for now. --I agree with reimplantation on Monday. --If her blood cultures remain negative I would then discontinuing antibiotics one day after new pace maker insertion     07/15/2015, 1:06 PM   Thank you so much for this interesting consult  [place] for [place] [phone] (pager) [phone]  (office) 07/15/2015, 1:06 PM  Rhina Brack[place] 07/15/2015, 1:06 PM

## 2015-07-15 NOTE — Progress Notes (Signed)
Pharmacy Antibiotic Note  Tiffany [name] is a 82 y.o. female admitted on 07/11/2015 with infected pacemaker/bacteremia. Pharmacy has been consulted for daptomycin dosing.  82 y.o. female. h/o complete heart block, chronic systolic heart failure and multiple lead issues. Successful removal of a dual coil ICD lead, a left ventricular pacing lead, and 2 old atrial and ventricular past fixation pacing leads with successful insertion of a temporary permanent single-chamber pacemaker. Temp pm placed with plans to put another PPM back in on Monday.  ID has seen patient today and is starting daptomycin, due to patient's renal disease this will need to be dosed every other day.  Plan: Daptomycin 8mg /kg every 48 hours Check CK weekly Ceftriaxone 2g q24 hours  Height: 5\' 7"  (170.2 cm) Weight: 160 lb (72.576 kg) IBW/kg (Calculated) : 61.6  Temp (24hrs), Avg:98.2 F (36.8 C), Min:97.8 F (36.6 C), Max:98.9 F (37.2 C)   Recent Labs Lab 07/11/15 1426 07/12/15 0440 07/13/15 0300 07/14/15 0445 07/15/15 0657  WBC 8.8 11.7* 10.6* 8.5  --   CREATININE 1.08* 1.50* 2.48* 2.13* 1.68*    Estimated Creatinine Clearance: 21.2 mL/min (by C-G formula based on Cr of 1.68).    No Known Allergies  Antimicrobials this admission: Ancef 2/20 >> 2/21 Ceftriaxone 2/24 >>  Daptomycin 2/24>>  Dose adjustments this admission:   Microbiology results: 2/24 [id number] 2/20 MRSA PCR: neg  Thank you for allowing pharmacy to be a part of this patient's care.  Sheppard Coil PharmD., BCPS Clinical Pharmacist Pager [phone] 07/15/2015 1:29 PM

## 2015-07-16 LAB — BASIC METABOLIC PANEL
Anion gap: 13 (ref 5–15)
BUN: 31 mg/dL — AB (ref 6–20)
CHLORIDE: 100 mmol/L — AB (ref 101–111)
CO2: 25 mmol/L (ref 22–32)
CREATININE: 1.4 mg/dL — AB (ref 0.44–1.00)
Calcium: 8.4 mg/dL — ABNORMAL LOW (ref 8.9–10.3)
GFR calc Af Amer: 37 mL/min — ABNORMAL LOW (ref >60)
GFR calc non Af Amer: 32 mL/min — ABNORMAL LOW (ref >60)
Glucose, Bld: 104 mg/dL — ABNORMAL HIGH (ref 65–99)
Potassium: 3.7 mmol/L (ref 3.5–5.1)
Sodium: 138 mmol/L (ref 135–145)

## 2015-07-16 MED ORDER — HYDRALAZINE HCL 20 MG/ML IJ SOLN
10.0000 mg | Freq: Four times a day (QID) | INTRAMUSCULAR | Status: DC | PRN
  Administered 2015-07-16 – 2015-07-18 (×3): 10 mg via INTRAVENOUS
  Filled 2015-07-16 (×3): qty 1

## 2015-07-16 NOTE — Evaluation (Signed)
Physical Therapy Evaluation Patient Details Name: Tiffany [name] MRN: [id number] DOB: 1923/05/17 Today's Date: 07/16/2015   History of Present Illness  Patient is a 82 yo female admitted 07/11/15 with pacemaker lead/pocket infection, now s/p lead removal and placement of temp pacing wire.  (In setting of complete heart block)  Patient for new pacemaker placement 07/18/15.   PMH:  NICM, pacemaker/ICK, complete heart block, HTN, CHF, HOH  Clinical Impression  Patient presents with problems listed below. Will benefit from acute PT to maximize functional mobility prior to discharge.  Patient independent pta.  Now requiring mod assist for mobility.  Recommend SNF at discharge for continued therapy.    Follow Up Recommendations SNF    Equipment Recommendations  Rolling walker with 5" wheels    Recommendations for Other Services       Precautions / Restrictions Precautions Precautions: Fall;ICD/Pacemaker Precaution Comments: Patient reports fall at home at mailbox Restrictions Weight Bearing Restrictions: No      Mobility  Bed Mobility Overal bed mobility: Needs Assistance;+2 for physical assistance Bed Mobility: Supine to Sit     Supine to sit: Mod assist;+2 for physical assistance     General bed mobility comments: Verbal cues for technique.  Assist to move LE's off of bed and to raise trunk to sitting position.  Assist using bed pads to move to EOB.  Transfers Overall transfer level: Needs assistance Equipment used: 2 person hand held assist Transfers: Sit to/from [employer] Sit to Stand: Mod assist;+2 physical assistance Stand pivot transfers: Min assist;+2 physical assistance       General transfer comment: Verbal cues for technique.  Assist to power up to standing, and for balance.  Patient able to take several shuffle steps to pivot to chair with assist for balance.  Assist to control descent into chair.  Ambulation/Gait                Stairs             Wheelchair Mobility    Modified Rankin (Stroke Patients Only)       Balance Overall balance assessment: Needs assistance         Standing balance support: Bilateral upper extremity supported Standing balance-Leahy Scale: Poor                               Pertinent Vitals/Pain Pain Assessment: Faces Faces Pain Scale: Hurts little more Pain Location: Lt shoulder.  Reports injury from prior fall Pain Descriptors / Indicators: Aching;Sore Pain Intervention(s): Monitored during session;Repositioned    Home Living Family/patient expects to be discharged to:: Skilled nursing facility Living Arrangements: Alone                    Prior Function Level of Independence: Independent               Hand Dominance        Extremity/Trunk Assessment   Upper Extremity Assessment: RUE deficits/detail;LUE deficits/detail RUE Deficits / Details: Decreased ROM from recent surgery.     LUE Deficits / Details: Decreased strength and ROM from prior injury per patient.  Painful with movement   Lower Extremity Assessment: Generalized weakness         Communication   Communication: HOH  Cognition Arousal/Alertness: Awake/alert Behavior During Therapy: WFL for tasks assessed/performed Overall Cognitive Status: Impaired/Different from baseline Area of Impairment: Orientation;Memory;Awareness;Problem solving Orientation Level: Disoriented to;Time;Situation   Memory: Decreased  short-term memory       Problem Solving: Slow processing;Difficulty sequencing;Requires verbal cues General Comments: Patient unable to state why she is in hospital.  When asked if she has had any procedures, she reports "no".      General Comments      Exercises        Assessment/Plan    PT Assessment Patient needs continued PT services  PT Diagnosis Difficulty walking;Generalized weakness;Acute pain;Altered mental status   PT Problem List Decreased  strength;Decreased range of motion;Decreased activity tolerance;Decreased balance;Decreased mobility;Decreased cognition;Decreased knowledge of use of DME;Cardiopulmonary status limiting activity;Pain  PT Treatment Interventions DME instruction;Gait training;Functional mobility training;Therapeutic activities;Therapeutic exercise;Cognitive remediation;Patient/family education   PT Goals (Current goals can be found in the Care Plan section) Acute Rehab PT Goals Patient Stated Goal: None stated PT Goal Formulation: With patient Time For Goal Achievement: 07/23/15 Potential to Achieve Goals: Good    Frequency Min 3X/week   Barriers to discharge Decreased caregiver support Patient lives alone    Co-evaluation               End of Session   Activity Tolerance: Patient limited by fatigue Patient left: in chair;with call bell/phone within reach Nurse Communication: Mobility status (Patient in chair)         Time: 8657-8469 PT Time Calculation (min) (ACUTE ONLY): 12 min   Charges:   PT Evaluation $PT Eval High Complexity: 1 Procedure     PT G CodesVena Austria Aug 02, 2015, 6:32 PM Durenda Hurt. Renaldo Fiddler, [facility] Acute Rehab Services Pager [phone]

## 2015-07-16 NOTE — Progress Notes (Signed)
Patient ID: Tiffany [name], female   DOB: 12/22/1922, 82 y.o.   MRN: [id number]    Patient Name: Tiffany [name] Date of Encounter: 07/16/2015     Active Problems:   Complete heart block (HCC)   Pacemaker infection (HCC)   Dementia with behavioral disturbance    SUBJECTIVE  No chest pain or sob. C/o feeling weak.   CURRENT MEDS . cefTRIAXone (ROCEPHIN)  IV  2 g Intravenous Q24H  . DAPTOmycin (CUBICIN)  IV  580 mg Intravenous Q48H  . heparin subcutaneous  5,000 Units Subcutaneous 3 times per day    OBJECTIVE  Filed Vitals:   07/16/15 0746 07/16/15 0800 07/16/15 1146 07/16/15 1200  BP: 142/72 153/76 114/52 128/47  Pulse: 80  80   Temp: 98.5 F (36.9 C)  98.7 F (37.1 C)   TempSrc: Oral  Oral   Resp: 22 21 16 22   Height:      Weight:      SpO2: 95% 95% 97% 95%    Intake/Output Summary (Last 24 hours) at 07/16/15 1328 Last data filed at 07/16/15 1200  Gross per 24 hour  Intake   3146 ml  Output   1025 ml  Net   2121 ml   Filed Weights   07/11/15 1350  Weight: 160 lb (72.576 kg)    PHYSICAL EXAM  General: Pleasant, NAD. Neuro: Alert and oriented X 3. Moves all extremities spontaneously. Psych: Normal affect. HEENT:  Normal  Neck: Supple without bruits or JVD. Lungs:  Resp regular and unlabored, CTA. Heart: RRR no s3, s4, or murmurs. Abdomen: Soft, non-tender, non-distended, BS + x 4.  Extremities: No clubbing, cyanosis or edema. DP/PT/Radials 2+ and equal bilaterally.  Accessory Clinical Findings  CBC  Recent Labs  07/14/15 0445  WBC 8.5  NEUTROABS 5.6  HGB 8.5*  HCT 25.7*  MCV 98.5  PLT 114*   Basic Metabolic Panel  Recent Labs  07/15/15 0657 07/16/15 0021  NA 135 138  K 3.8 3.7  CL 103 100*  CO2 24 25  GLUCOSE 107* 104*  BUN 28* 31*  CREATININE 1.68* 1.40*  CALCIUM 8.2* 8.4*   Liver Function Tests No results for input(s): AST, ALT, ALKPHOS, BILITOT, PROT, ALBUMIN in the last 72 hours. No results for input(s): LIPASE, AMYLASE in the  last 72 hours. Cardiac Enzymes No results for input(s): CKTOTAL, CKMB, CKMBINDEX, TROPONINI in the last 72 hours. BNP Invalid input(s): POCBNP D-Dimer No results for input(s): DDIMER in the last 72 hours. Hemoglobin A1C No results for input(s): HGBA1C in the last 72 hours. Fasting Lipid Panel No results for input(s): CHOL, HDL, LDLCALC, TRIG, CHOLHDL, LDLDIRECT in the last 72 hours. Thyroid Function Tests No results for input(s): TSH, T4TOTAL, T3FREE, THYROIDAB in the last 72 hours.  Invalid input(s): FREET3  TELE  Ventricular pacing  Radiology/Studies  Dg Chest Port 1 View  07/11/2015  CLINICAL DATA:  New pacemaker insertion. EXAM: PORTABLE CHEST 1 VIEW COMPARISON:  03/27/2013 FINDINGS: The heart is enlarged but stable. The prior pacemaker and pacer wires have been removed. There is a new single right ventricular pacer wire via a right internal jugular approach. There is also a femoral vein catheter coursing up into the right atrium and right ventricle. IMPRESSION: Removal of prior ventricular pacer wires and placement of a single right ventricular pacer wire. A femoral line is seen extending into the right ventricle. Electronically Signed   By: Rudie Meyer M.D.   On: 07/11/2015 21:44    ASSESSMENT  AND PLAN  1. PM system infection - s/p extraction 2. CHB - she is s/p placement of a temp perm PPM via the right IJ. Venogram shows patency of veins on the left side.  Tiffany [name] plan on pacemaker placement on Monday with Dr. Ladona Ridgel. 3. Weakness - nursed tried to get her up yesterday and she is very weak from being in the bed and being 89. Tiffany [name] have PT/OT see, likely discharge to SNF 4. Acute on chronic renal failure - creatinine continuing to improve.   Tiffany [name], M.D.  07/16/2015 1:28 PM

## 2015-07-17 MED ORDER — CEFAZOLIN SODIUM-DEXTROSE 2-3 GM-% IV SOLR
2.0000 g | INTRAVENOUS | Status: DC
  Filled 2015-07-17: qty 50

## 2015-07-17 MED ORDER — SODIUM CHLORIDE 0.9 % IV SOLN
INTRAVENOUS | Status: DC
  Administered 2015-07-18: 06:00:00 via INTRAVENOUS

## 2015-07-17 MED ORDER — SODIUM CHLORIDE 0.9 % IR SOLN
80.0000 mg | Status: DC
  Administered 2015-07-18: 80 mg
  Filled 2015-07-17: qty 2

## 2015-07-17 MED ORDER — CEFAZOLIN SODIUM-DEXTROSE 2-3 GM-% IV SOLR
2.0000 g | INTRAVENOUS | Status: DC
  Administered 2015-07-18: 2 g via INTRAVENOUS
  Filled 2015-07-17: qty 50

## 2015-07-17 MED ORDER — CHLORHEXIDINE GLUCONATE 4 % EX LIQD: 60.0000 mL | Freq: Once | CUTANEOUS | Status: DC

## 2015-07-17 MED ORDER — CHLORHEXIDINE GLUCONATE 4 % EX LIQD
60.0000 mL | Freq: Once | CUTANEOUS | Status: DC
Start: 2015-07-18 — End: 2015-07-17

## 2015-07-17 MED ORDER — SODIUM CHLORIDE 0.9 % IR SOLN
80.0000 mg | Status: DC
  Filled 2015-07-17 (×3): qty 2

## 2015-07-17 NOTE — Progress Notes (Signed)
Patient ID: Tiffany [name], female   DOB: 1922-12-09, 82 y.o.   MRN: [id number]    Patient Name: Tiffany [name] Date of Encounter: 07/17/2015     Active Problems:   Complete heart block (HCC)   Pacemaker infection (HCC)   Dementia with behavioral disturbance    SUBJECTIVE  No chest pain or sob. Still feels weak, worked with PT who suggests SNF at discharge.  CURRENT MEDS . cefTRIAXone (ROCEPHIN)  IV  2 g Intravenous Q24H  . DAPTOmycin (CUBICIN)  IV  580 mg Intravenous Q48H  . heparin subcutaneous  5,000 Units Subcutaneous 3 times per day    OBJECTIVE  Filed Vitals:   07/17/15 0500 07/17/15 0600 07/17/15 0700 07/17/15 0708  BP:    143/51  Pulse:    80  Temp:    98.9 F (37.2 C)  TempSrc:    Oral  Resp: Height:      Weight:      SpO2: 96% 95% 95% 95%    Intake/Output Summary (Last 24 hours) at 07/17/15 0806 Last data filed at 07/17/15 0700  Gross per 24 hour  Intake   2255 ml  Output   1225 ml  Net   1030 ml   Filed Weights   07/11/15 1350  Weight: 160 lb (72.576 kg)    PHYSICAL EXAM  General: Pleasant, NAD. Neuro: Alert and oriented X 3. Moves all extremities spontaneously. Psych: Normal affect. HEENT:  Normal  Neck: Supple without bruits or JVD. Lungs:  Resp regular and unlabored, CTA. Heart: RRR no s3, s4, or murmurs. Abdomen: Soft, non-tender, non-distended, BS + x 4.  Extremities: No clubbing, cyanosis or edema. DP/PT/Radials 2+ and equal bilaterally.  Accessory Clinical Findings  CBC No results for input(s): WBC, NEUTROABS, HGB, HCT, MCV, PLT in the last 72 hours. Basic Metabolic Panel  Recent Labs  07/15/15 0657 07/16/15 0021  NA 135 138  K 3.8 3.7  CL 103 100*  CO2 24 25  GLUCOSE 107* 104*  BUN 28* 31*  CREATININE 1.68* 1.40*  CALCIUM 8.2* 8.4*   Liver Function Tests No results for input(s): AST, ALT, ALKPHOS, BILITOT, PROT, ALBUMIN in the last 72 hours. No results for input(s): LIPASE, AMYLASE in the last 72  hours. Cardiac Enzymes No results for input(s): CKTOTAL, CKMB, CKMBINDEX, TROPONINI in the last 72 hours. BNP Invalid input(s): POCBNP D-Dimer No results for input(s): DDIMER in the last 72 hours. Hemoglobin A1C No results for input(s): HGBA1C in the last 72 hours. Fasting Lipid Panel No results for input(s): CHOL, HDL, LDLCALC, TRIG, CHOLHDL, LDLDIRECT in the last 72 hours. Thyroid Function Tests No results for input(s): TSH, T4TOTAL, T3FREE, THYROIDAB in the last 72 hours.  Invalid input(s): FREET3  TELE  Ventricular pacing  Radiology/Studies  Dg Chest Port 1 View  07/11/2015  CLINICAL DATA:  New pacemaker insertion. EXAM: PORTABLE CHEST 1 VIEW COMPARISON:  03/27/2013 FINDINGS: The heart is enlarged but stable. The prior pacemaker and pacer wires have been removed. There is a new single right ventricular pacer wire via a right internal jugular approach. There is also a femoral vein catheter coursing up into the right atrium and right ventricle. IMPRESSION: Removal of prior ventricular pacer wires and placement of a single right ventricular pacer wire. A femoral line is seen extending into the right ventricle. Electronically Signed   By: Rudie Meyer M.D.   On: 07/11/2015 21:44    ASSESSMENT AND PLAN  1. PM system infection -  s/p extraction 2. CHB - she is s/p placement of a temp perm PPM via the right IJ. Venogram shows patency of veins on the left side.  Pacemaker planned for Monday with Dr. Ladona Ridgel.  Discussed the procedure with the patient today and answered all questions. 3. Weakness - evaluated by PT yesterday, SNF at discharge 4. Acute on chronic renal failure - creatinine continuing to improve.   Loman Brooklyn, M.D.  07/17/2015 8:06 AM

## 2015-07-18 ENCOUNTER — Telehealth: Payer: Self-pay | Admitting: Internal Medicine

## 2015-07-18 ENCOUNTER — Encounter (HOSPITAL_COMMUNITY): Payer: Self-pay | Admitting: Physician Assistant

## 2015-07-18 ENCOUNTER — Inpatient Hospital Stay (HOSPITAL_COMMUNITY): Payer: Medicare Other

## 2015-07-18 ENCOUNTER — Encounter (HOSPITAL_COMMUNITY)
Admission: RE | Disposition: A | Discharge status: Home Health | Payer: Self-pay | Source: Ambulatory Visit | Attending: Internal Medicine

## 2015-07-18 DIAGNOSIS — I442 Atrioventricular block, complete: Secondary | ICD-10-CM

## 2015-07-18 DIAGNOSIS — I428 Other cardiomyopathies: Secondary | ICD-10-CM

## 2015-07-18 HISTORY — DX: Atrioventricular block, complete: I44.2

## 2015-07-18 HISTORY — PX: OTHER SURGICAL HISTORY: SHX169

## 2015-07-18 HISTORY — PX: EP IMPLANTABLE DEVICE: SHX172B

## 2015-07-18 LAB — BASIC METABOLIC PANEL
ANION GAP: 10 (ref 5–15)
BUN: 22 mg/dL — ABNORMAL HIGH (ref 6–20)
CHLORIDE: 105 mmol/L (ref 101–111)
CO2: 24 mmol/L (ref 22–32)
Calcium: 8.2 mg/dL — ABNORMAL LOW (ref 8.9–10.3)
Creatinine, Ser: 1.26 mg/dL — ABNORMAL HIGH (ref 0.44–1.00)
GFR calc non Af Amer: 36 mL/min — ABNORMAL LOW (ref >60)
GFR, EST AFRICAN AMERICAN: 42 mL/min — AB (ref >60)
GLUCOSE: 99 mg/dL (ref 65–99)
Potassium: 3.5 mmol/L (ref 3.5–5.1)
Sodium: 139 mmol/L (ref 135–145)

## 2015-07-18 LAB — CBC
HCT: 22.8 % — ABNORMAL LOW (ref 36.0–46.0)
HEMOGLOBIN: 7.8 g/dL — AB (ref 12.0–15.0)
MCH: 33.2 pg (ref 26.0–34.0)
MCHC: 34.2 g/dL (ref 30.0–36.0)
MCV: 97 fL (ref 78.0–100.0)
Platelets: 178 10*3/uL (ref 150–400)
RBC: 2.35 MIL/uL — AB (ref 3.87–5.11)
RDW: 14.1 % (ref 11.5–15.5)
WBC: 7.9 10*3/uL (ref 4.0–10.5)

## 2015-07-18 LAB — SURGICAL PCR SCREEN
MRSA, PCR: NEGATIVE
Staphylococcus aureus: NEGATIVE

## 2015-07-18 LAB — CK: CK TOTAL: 28 U/L — AB (ref 38–234)

## 2015-07-18 SURGERY — PACEMAKER IMPLANT
Anesthesia: LOCAL

## 2015-07-18 MED ORDER — ORITAVANCIN DIPHOSPHATE 400 MG IV SOLR
1200.0000 mg | Freq: Once | INTRAVENOUS | Status: DC
  Filled 2015-07-18: qty 120

## 2015-07-18 MED ORDER — MIDAZOLAM HCL 5 MG/5ML IJ SOLN
INTRAMUSCULAR | Status: AC
  Filled 2015-07-18: qty 5

## 2015-07-18 MED ORDER — LIDOCAINE HCL (PF) 1 % IJ SOLN
INTRAMUSCULAR | Status: AC
  Filled 2015-07-18: qty 60

## 2015-07-18 MED ORDER — IOHEXOL 350 MG/ML SOLN
INTRAVENOUS | Status: DC | PRN
  Administered 2015-07-18: 15 mL via INTRAVENOUS

## 2015-07-18 MED ORDER — CHLORHEXIDINE GLUCONATE 4 % EX LIQD
CUTANEOUS | Status: AC
  Administered 2015-07-18: 01:00:00
  Filled 2015-07-18: qty 15

## 2015-07-18 MED ORDER — CEFAZOLIN SODIUM 1-5 GM-% IV SOLN
1.0000 g | Freq: Four times a day (QID) | INTRAVENOUS | Status: AC
  Administered 2015-07-18 – 2015-07-19 (×3): 1 g via INTRAVENOUS
  Filled 2015-07-18 (×3): qty 50

## 2015-07-18 MED ORDER — SODIUM CHLORIDE 0.9% FLUSH
3.0000 mL | Freq: Two times a day (BID) | INTRAVENOUS | Status: DC
  Administered 2015-07-18 – 2015-07-19 (×2): 3 mL via INTRAVENOUS

## 2015-07-18 MED ORDER — HEPARIN (PORCINE) IN NACL 2-0.9 UNIT/ML-% IJ SOLN
INTRAMUSCULAR | Status: AC
  Filled 2015-07-18: qty 500

## 2015-07-18 MED ORDER — FENTANYL CITRATE (PF) 100 MCG/2ML IJ SOLN
INTRAMUSCULAR | Status: AC
  Filled 2015-07-18: qty 2

## 2015-07-18 MED ORDER — LIDOCAINE HCL (PF) 1 % IJ SOLN
INTRAMUSCULAR | Status: DC | PRN
  Administered 2015-07-18: 31 mL via SUBCUTANEOUS

## 2015-07-18 MED ORDER — ACETAMINOPHEN 325 MG PO TABS
325.0000 mg | ORAL_TABLET | ORAL | Status: DC | PRN
  Administered 2015-07-19 (×2): 650 mg via ORAL
  Administered 2015-07-21 – 2015-07-22 (×2): 325 mg via ORAL
  Filled 2015-07-18 (×2): qty 2
  Filled 2015-07-18 (×2): qty 1

## 2015-07-18 MED ORDER — NON FORMULARY: 1200.0000 mg | Freq: Once | Status: DC

## 2015-07-18 MED ORDER — ONDANSETRON HCL 4 MG/2ML IJ SOLN: 4.0000 mg | Freq: Four times a day (QID) | INTRAMUSCULAR | Status: DC | PRN

## 2015-07-18 MED ORDER — HEPARIN (PORCINE) IN NACL 2-0.9 UNIT/ML-% IJ SOLN
INTRAMUSCULAR | Status: DC | PRN
  Administered 2015-07-18: 500 mL

## 2015-07-18 MED ORDER — SODIUM CHLORIDE 0.9% FLUSH: 3.0000 mL | INTRAVENOUS | Status: DC | PRN

## 2015-07-18 MED ORDER — HYDROCODONE-ACETAMINOPHEN 5-325 MG PO TABS
1.0000 | ORAL_TABLET | ORAL | Status: DC | PRN
  Administered 2015-07-20: 1 via ORAL
  Filled 2015-07-18: qty 1

## 2015-07-18 MED ORDER — SODIUM CHLORIDE 0.9 % IV SOLN: 250.0000 mL | INTRAVENOUS | Status: DC | PRN

## 2015-07-18 SURGICAL SUPPLY — 8 items
CABLE SURGICAL S-101-97-12 (CABLE) ×2 IMPLANT
LEAD CAPSURE NOVUS 45CM (Lead) ×2 IMPLANT
LEAD CAPSURE NOVUS 5076-58CM (Lead) ×2 IMPLANT
PACEMAKER ADAPTA DR ADDRL1 (Pacemaker) ×1 IMPLANT
PAD DEFIB LIFELINK (PAD) ×2 IMPLANT
PPM ADAPTA DR ADDRL1 (Pacemaker) ×2 IMPLANT
SHEATH CLASSIC 7F (SHEATH) ×4 IMPLANT
TRAY PACEMAKER INSERTION (PACKS) ×2 IMPLANT

## 2015-07-18 NOTE — Progress Notes (Signed)
  Echocardiogram 2D Echocardiogram has been performed.  Tiffany [name] 07/18/2015, 9:11 AM

## 2015-07-18 NOTE — Telephone Encounter (Signed)
New message     Calling to talk to the nurse about his mother's procedure scheduled for today

## 2015-07-18 NOTE — Progress Notes (Signed)
PT Cancellation Note  Patient Details Name: Tiffany [name] MRN: [id number] DOB: 01/22/1923   Cancelled Treatment:    Reason Eval/Treat Not Completed: Other (comment)   Noted plans for pacemaker reimplantation today;  Will follow up tomorrow;  Thank you,  Van Clines, PT  Acute Rehabilitation Services Pager [phone] Office [phone]     Van Clines [facility] 07/18/2015, 8:20 AM

## 2015-07-18 NOTE — Progress Notes (Signed)
Patient has arrived on unit from cath lab originally from [facility].Patient oriented to unit, placed on tel verified by two people , VS were stable.

## 2015-07-18 NOTE — Progress Notes (Signed)
Patient ID: Tiffany [name], female   DOB: June 23, 1922, 82 y.o.   MRN: [id number]    Patient Name: Tiffany [name]: 07/18/2015     Active Problems:   Complete heart block (HCC)   Pacemaker infection (HCC)   Dementia with behavioral disturbance    SUBJECTIVE She reports feeling well this morning, denies any CP, palpitations or SOB, no new concerns outside of feeling hungry and thirsty this morning.  CURRENT MEDS .  ceFAZolin (ANCEF) IV  2 g Intravenous On Call  . cefTRIAXone (ROCEPHIN)  IV  2 g Intravenous Q24H  . DAPTOmycin (CUBICIN)  IV  580 mg Intravenous Q48H  . gentamicin irrigation  80 mg Irrigation On Call    OBJECTIVE  Filed Vitals:   07/18/15 0500 07/18/15 0600 07/18/15 0700 07/18/15 0730  BP:    176/67  Pulse:      Temp:   99.2 F (37.3 C)   TempSrc:   Oral   Resp: 22 21 18 28   Height:      Weight:      SpO2: 97% 95% 96% 97%    Intake/Output Summary (Last 24 hours) at 07/18/15 0839 Last data filed at 07/18/15 0700  Gross per 24 hour  Intake 2220.83 ml  Output   2100 ml  Net 120.83 ml   Filed Weights   07/11/15 1350  Weight: 160 lb (72.576 kg)    PHYSICAL EXAM  General: Pleasant, elderly WF in NAD. Neuro: Alert and oriented X 3. Moves all extremities spontaneously. Psych: Normal affect. HEENT:  Normal  Neck: Supple without bruits or JVD. Lungs:  Resp regular and unlabored, CTA. Heart: RRR no significant murmurs are appreciated Abdomen: Soft, non-tender, non-distended  Extremities: No clubbing, cyanosis or edema. DP/PT/Radials 2+ and equal bilaterally.  Right sided dressing/chest in place is dry.   Basic Metabolic Panel  Recent Labs  07/16/15 0021  NA 138  K 3.7  CL 100*  CO2 25  GLUCOSE 104*  BUN 31*  CREATININE 1.40*  CALCIUM 8.4*   Cardiac Enzymes  Recent Labs  07/18/15 0531  CKTOTAL 28*   TELE Ventricular pacing  Radiology/Studies  Dg Chest Port 1 View 07/11/2015  CLINICAL DATA:  New pacemaker insertion.  EXAM: PORTABLE CHEST 1 VIEW COMPARISON:  03/27/2013 FINDINGS: The heart is enlarged but stable. The prior pacemaker and pacer wires have been removed. There is a new single right ventricular pacer wire via a right internal jugular approach. There is also a femoral vein catheter coursing up into the right atrium and right ventricle. IMPRESSION: Removal of prior ventricular pacer wires and placement of a single right ventricular pacer wire. A femoral line is seen extending into the right ventricle. Electronically Signed   By: Rudie Meyer M.D.   On: 07/11/2015 21:44    ASSESSMENT AND PLAN  1. PM/ICD system infection      s/p extraction on 07/11/15 of dual coil ICD lead, a left ventricular pacing lead, and 2 old atrial and ventricular past fixation pacing leads by Dr. Ladona Ridgel     On Rocephin, Daptomycin      Negative blood cultures (x2)  x2 days so far  2. CHB       she is s/p placement of a temp perm PPM via the right IJ.      Venogram shows patency of veins on the left side.  Pacemaker planned for today with Dr. Johney Frame.        Dr. Elberta Fortis discussed the procedure  with the patient  and answered all questions.      I Re-discussed this morning with her risks/benefits of the procedure, she has no new questions and is agreeable to proceed      Echo is done, pending a result this morning      BP stable/HTN  3. Weakness -       evaluated by PT yesterday, planned for SNF at discharge  4. Acute on chronic renal failure      creatinine continuing to improve.     Creat 07/16/15 1.40 (from 2.48)   Renee Ursuy, PA-C  07/18/2015 8:39 AM    I have seen, examined the patient, and reviewed the above assessment and plan.  On exam, RRR. (paced) Changes to above are made where necessary.   She has complete heart block.  Drs Taylor and I have discussed her case at length.  Given her advanced age, fragility, and recent infection, will plan on dual chamber PPM rather than ICD or CRT device.  ID consult appreciated  and recs noted with their suggestion to proceed today with reimplantation.  Echo and labs are pending. Risks, benefits, alternatives to pacemaker implantation with temp/perm device removal were discussed in detail with the patient today. The patient understands that the risks include but are not limited to bleeding, infection, pneumothorax, perforation, tamponade, vascular damage, renal failure, MI, stroke, death,  and lead dislodgement and wishes to proceed.    Co Sign: Jazmene Racz, MD 07/18/2015 10:31 AM   

## 2015-07-18 NOTE — NC FL2 (Signed)
[place] MEDICAID FL2 LEVEL OF CARE SCREENING TOOL     IDENTIFICATION  Patient Name: Tiffany [name] Birthdate: 09/19/1922 Sex: female Admission Date (Current Location): 07/11/2015  [facility] and [place] Number:  Producer, television/film/video and Address:  [facility] [place], [address], [place], [place] [address]      Provider Number: [id number]  Attending Physician Name and Address:  Marinus Maw, MD  Relative Name and Phone Number:  Jevon, Safer [phone]    Current Level of Care: Hospital Recommended Level of Care: Skilled Nursing Facility Prior Approval Number:    Date Approved/Denied:   PASRR Number: [id number]  Discharge Plan: SNF    Current Diagnoses: Patient Active Problem List   Diagnosis Date Noted  . Pacemaker infection (HCC)   . Dementia with behavioral disturbance   . Heart block AV complete (HCC) 03/08/2014  . Fall 03/27/2013  . Near syncope 03/27/2013  . UTI (urinary tract infection) 03/27/2013  . Biventricular cardiac pacemaker in situ 02/22/2012  . Insomnia 02/02/2011  . Vitamin D deficiency 02/02/2011  . Impaired glucose tolerance 02/02/2011  . Hearing loss 02/02/2011  . Arthritis   . Glaucoma   . Complete heart block (HCC)   . Osteoporosis   . Allergy   . Preventative health care 01/26/2011  . Status post biventricular pacer-ICD   . Essential hypertension, benign   . Hyperlipidemia   . Hypothyroidism   . Nonischemic cardiomyopathy (HCC)   . Systolic heart failure (HCC)   . Ventricular tachycardia (HCC)     Orientation RESPIRATION BLADDER Height & Weight     Self, Time, Situation, Place  Normal Incontinent Weight: 160 lb (72.576 kg) Height:  5\' 7"  (170.2 cm)  BEHAVIORAL SYMPTOMS/MOOD NEUROLOGICAL BOWEL NUTRITION STATUS      Continent Diet (Cardiac)  AMBULATORY STATUS COMMUNICATION OF NEEDS Skin   Limited Assist Verbally Surgical wounds                       Personal Care Assistance Level of Assistance   Bathing, Dressing Bathing Assistance: Limited assistance   Dressing Assistance: Limited assistance     Functional Limitations Info             SPECIAL CARE FACTORS FREQUENCY  PT (By licensed PT)     PT Frequency: 5x a week              Contractures      Additional Factors Info  Code Status, Allergies Code Status Info: Prior Allergies Info: NKA           Current Medications (07/18/2015):  This is the current hospital active medication list Current Facility-Administered Medications  Medication Dose Route Frequency Provider Last Rate Last Dose  . 0.9 %  sodium chloride infusion   Intravenous Continuous Marily Lente, NP   Stopped at 07/18/15 [phone]  . acetaminophen (TYLENOL) tablet 325-650 mg  325-650 mg Oral Q4H PRN Marinus Maw, MD   325 mg at 07/14/15 0319  . DAPTOmycin (CUBICIN) 580 mg in sodium chloride 0.9 % IVPB  580 mg Intravenous Q48H Earnie Larsson, RPH   580 mg at 07/17/15 1200  . hydrALAZINE (APRESOLINE) injection 10 mg  10 mg Intravenous Q6H PRN Joellyn Rued, MD   10 mg at 07/18/15 0010  . [START ON 07/19/2015] NON FORMULARY 1,200 mg  1,200 mg Intravenous Once Randall Hiss, MD      . ondansetron [facility]) injection 4  mg  4 mg Intravenous Q6H PRN Marinus Maw, MD      . Melene Muller ON 07/19/2015] Oritavancin Diphosphate (ORBACTIV) 1,200 mg in dextrose 5 % IVPB  1,200 mg Intravenous Once Randall Hiss, MD      . traMADol Janean Sark) tablet 50 mg  50 mg Oral Q6H PRN Marinus Maw, MD   50 mg at 07/18/15 0110     Discharge Medications: Please see discharge summary for a list of discharge medications.  Relevant Imaging Results:  Relevant Lab Results:   Additional Information SSN [id number]  Darleene Cleaver, [place]

## 2015-07-18 NOTE — Telephone Encounter (Signed)
Spoke with son and let him know that it looks like 3:00pm today, but I'm on the outpatient side and the times could change.  I asked if he had spoken with her nurse on the floor and he says he has tried but he isn't getting a definite answer.  I told him best I could tell would be 3pm.  He says he has contacted an Production designer, theatre/television/film and they are going to call him back.

## 2015-07-18 NOTE — Progress Notes (Signed)
      INFECTIOUS DISEASE ATTENDING ADDENDUM:   Date: 07/18/2015  Patient name: Tiffany [name]  Medical record number: [id number]  Date of birth: 03/18/1923    I had further discussions with Dr. Johney Frame re risk of PICC line in this demented elderly patient with new PM  I think option of long acting IV oritavancin would allow [place] to give her a systemic IV abx that will last for the 2 weeks post procedure we are looking at without placing a PICC line  I will dc picc line order and place order for IV oritavancin    Paulette Blanch Dam 07/18/2015, 1:20 PM

## 2015-07-18 NOTE — Progress Notes (Signed)
Pharmacy Antibiotic Note  Tiffany [name] is a 82 y.o. female admitted on 07/11/2015 with infected pacemaker/bacteremia. Pharmacy has been consulted for daptomycin dosing.  82 y.o. female. h/o complete heart block, chronic systolic heart failure and multiple lead issues. Successful removal of a dual coil ICD lead, a left ventricular pacing lead, and 2 old atrial and ventricular past fixation pacing leads with successful insertion of a temporary permanent single-chamber pacemaker.   Daptomycin/ceftriaxone was started on 2/24 and dose adjusted for renal function. No fevers have been noted, wbc continues to be within normal limits. CK is normal.  PPM placed today 07/18/2015. Per ID recommendations, if blood cultures remain negative would d/c antibiotics 1 day after new pacemaker implanted.   Plan: Daptomycin 8mg /kg every 48 hours Check CK next week if needed Ceftriaxone 2g q24 hours Follow up completion of abx tomorrow  Height: 5\' 7"  (170.2 cm) Weight: 160 lb (72.576 kg) IBW/kg (Calculated) : 61.6  Temp (24hrs), Avg:98.6 F (37 C), Min:98.2 F (36.8 C), Max:99.2 F (37.3 C)   Recent Labs Lab 07/11/15 1426 07/12/15 0440 07/13/15 0300 07/14/15 0445 07/15/15 0657 07/16/15 0021 07/18/15 1034  WBC 8.8 11.7* 10.6* 8.5  --   --  7.9  CREATININE 1.08* 1.50* 2.48* 2.13* 1.68* 1.40* 1.26*    Estimated Creatinine Clearance: 28.3 mL/min (by C-G formula based on Cr of 1.26).    No Known Allergies  Antimicrobials this admission: Ancef 2/20 >> 2/21 Ceftriaxone 2/24 >>  Daptomycin 2/24>>  Microbiology results: 2/24 NVB:TYOM 2/20 MRSA PCR: neg  Thank you for allowing pharmacy to be a part of this patient's care.  Sheppard Coil PharmD., BCPS Clinical Pharmacist Pager [phone] 07/18/2015 11:59 AM

## 2015-07-18 NOTE — H&P (View-Only) (Signed)
Patient ID: Tiffany [name], female   DOB: June 23, 1922, 82 y.o.   MRN: [id number]    Patient Name: Tiffany [name] Date of Encounter: 07/18/2015     Active Problems:   Complete heart block (HCC)   Pacemaker infection (HCC)   Dementia with behavioral disturbance    SUBJECTIVE She reports feeling well this morning, denies any CP, palpitations or SOB, no new concerns outside of feeling hungry and thirsty this morning.  CURRENT MEDS .  ceFAZolin (ANCEF) IV  2 g Intravenous On Call  . cefTRIAXone (ROCEPHIN)  IV  2 g Intravenous Q24H  . DAPTOmycin (CUBICIN)  IV  580 mg Intravenous Q48H  . gentamicin irrigation  80 mg Irrigation On Call    OBJECTIVE  Filed Vitals:   07/18/15 0500 07/18/15 0600 07/18/15 0700 07/18/15 0730  BP:    176/67  Pulse:      Temp:   99.2 F (37.3 C)   TempSrc:   Oral   Resp: 22 21 18 28   Height:      Weight:      SpO2: 97% 95% 96% 97%    Intake/Output Summary (Last 24 hours) at 07/18/15 0839 Last data filed at 07/18/15 0700  Gross per 24 hour  Intake 2220.83 ml  Output   2100 ml  Net 120.83 ml   Filed Weights   07/11/15 1350  Weight: 160 lb (72.576 kg)    PHYSICAL EXAM  General: Pleasant, elderly WF in NAD. Neuro: Alert and oriented X 3. Moves all extremities spontaneously. Psych: Normal affect. HEENT:  Normal  Neck: Supple without bruits or JVD. Lungs:  Resp regular and unlabored, CTA. Heart: RRR no significant murmurs are appreciated Abdomen: Soft, non-tender, non-distended  Extremities: No clubbing, cyanosis or edema. DP/PT/Radials 2+ and equal bilaterally.  Right sided dressing/chest in place is dry.   Basic Metabolic Panel  Recent Labs  07/16/15 0021  NA 138  K 3.7  CL 100*  CO2 25  GLUCOSE 104*  BUN 31*  CREATININE 1.40*  CALCIUM 8.4*   Cardiac Enzymes  Recent Labs  07/18/15 0531  CKTOTAL 28*   TELE Ventricular pacing  Radiology/Studies  Dg Chest Port 1 View 07/11/2015  CLINICAL DATA:  New pacemaker insertion.  EXAM: PORTABLE CHEST 1 VIEW COMPARISON:  03/27/2013 FINDINGS: The heart is enlarged but stable. The prior pacemaker and pacer wires have been removed. There is a new single right ventricular pacer wire via a right internal jugular approach. There is also a femoral vein catheter coursing up into the right atrium and right ventricle. IMPRESSION: Removal of prior ventricular pacer wires and placement of a single right ventricular pacer wire. A femoral line is seen extending into the right ventricle. Electronically Signed   By: Rudie Meyer M.D.   On: 07/11/2015 21:44    ASSESSMENT AND PLAN  1. PM/ICD system infection      s/p extraction on 07/11/15 of dual coil ICD lead, a left ventricular pacing lead, and 2 old atrial and ventricular past fixation pacing leads by Dr. Ladona Ridgel     On Rocephin, Daptomycin      Negative blood cultures (x2)  x2 days so far  2. CHB       she is s/p placement of a temp perm PPM via the right IJ.      Venogram shows patency of veins on the left side.  Pacemaker planned for today with Dr. Johney Frame.        Dr. Elberta Fortis discussed the procedure  with the patient  and answered all questions.      I Re-discussed this morning with her risks/benefits of the procedure, she has no new questions and is agreeable to proceed      Echo is done, pending a result this morning      BP stable/HTN  3. Weakness -       evaluated by PT yesterday, planned for SNF at discharge  4. Acute on chronic renal failure      creatinine continuing to improve.     Creat 07/16/15 1.40 (from 2.48)   Francis Dowse, PA-C  07/18/2015 8:39 AM    I have seen, examined the patient, and reviewed the above assessment and plan.  On exam, RRR. (paced) Changes to above are made where necessary.   She has complete heart block.  Drs Ladona Ridgel and I have discussed her case at length.  Given her advanced age, fragility, and recent infection, will plan on dual chamber PPM rather than ICD or CRT device.  ID consult appreciated  and recs noted with their suggestion to proceed today with reimplantation.  Echo and labs are pending. Risks, benefits, alternatives to pacemaker implantation with temp/perm device removal were discussed in detail with the patient today. The patient understands that the risks include but are not limited to bleeding, infection, pneumothorax, perforation, tamponade, vascular damage, renal failure, MI, stroke, death,  and lead dislodgement and wishes to proceed.    Co Sign: Hillis Range, MD 07/18/2015 10:31 AM

## 2015-07-18 NOTE — Interval H&P Note (Signed)
History and Physical Interval Note:  07/18/2015 11:12 AM  Tiffany [name]  has presented today for surgery, with the diagnosis of complete heart block  The various methods of treatment have been discussed with the patient and family. After consideration of risks, benefits and other options for treatment, the patient has consented to  Procedure(s): Pacemaker Implant (N/A) with temporary pacing wire removal as a surgical intervention .  The patient's history has been reviewed, patient examined, no change in status, stable for surgery.  I have reviewed the patient's chart and labs.  Questions were answered to the patient's satisfaction.     Hillis Range

## 2015-07-18 NOTE — Care Management Important Message (Signed)
Important Message  Patient Details  Name: Tiffany [name] MRN: [id number] Date of Birth: 02/05/1923   Medicare Important Message Given:  Yes    Oralia Rud [name] 07/18/2015, 4:36 PM

## 2015-07-18 NOTE — Progress Notes (Signed)
Report given to [employer] on [address]

## 2015-07-18 NOTE — Progress Notes (Signed)
Subjective:  No new complaints   Antibiotics:  Anti-infectives    Start     Dose/Rate Route Frequency Ordered Stop   07/18/15 0600  ceFAZolin (ANCEF) IVPB 2 g/50 mL premix     2 g 100 mL/hr over 30 Minutes Intravenous On call 07/17/15 1448 07/19/15 0600   07/18/15 0600  gentamicin (GARAMYCIN) 80 mg in sodium chloride irrigation 0.9 % 500 mL irrigation     80 mg Irrigation On call 07/17/15 1449 07/19/15 0600   07/17/15 1430  gentamicin (GARAMYCIN) 80 mg in sodium chloride irrigation 0.9 % 500 mL irrigation  Status:  Discontinued     80 mg Irrigation On call 07/17/15 1427 07/17/15 1449   07/17/15 1430  ceFAZolin (ANCEF) IVPB 2 g/50 mL premix  Status:  Discontinued     2 g 100 mL/hr over 30 Minutes Intravenous On call 07/17/15 1427 07/17/15 1448   07/15/15 1400  cefTRIAXone (ROCEPHIN) 2 g in dextrose 5 % 50 mL IVPB     2 g 100 mL/hr over 30 Minutes Intravenous Every 24 hours 07/15/15 1313     07/15/15 1200  DAPTOmycin (CUBICIN) 580 mg in sodium chloride 0.9 % IVPB     580 mg 223.2 mL/hr over 30 Minutes Intravenous Every 48 hours 07/15/15 1041     07/12/15 0015  ceFAZolin (ANCEF) IVPB 1 g/50 mL premix     1 g 100 mL/hr over 30 Minutes Intravenous Every 6 hours 07/11/15 2144 07/12/15 1232   07/11/15 1709  gentamicin (GARAMYCIN) 80 mg in sodium chloride irrigation 0.9 % 500 mL irrigation  Status:  Discontinued       As needed 07/11/15 1709 07/11/15 2044   07/11/15 1615  gentamicin (GARAMYCIN) 80 mg in sodium chloride irrigation 0.9 % 500 mL irrigation  Status:  Discontinued      Irrigation  Once 07/11/15 1607 07/11/15 2144   07/11/15 1441  ceFAZolin (ANCEF) 2-3 GM-% IVPB SOLR    Comments:  Shireen Quan   : cabinet override      07/11/15 1441 07/11/15 1646   07/10/15 0930  gentamicin (GARAMYCIN) 80 mg in sodium chloride irrigation 0.9 % 500 mL irrigation  Status:  Discontinued     80 mg Irrigation On call 07/10/15 0918 07/11/15 2144   07/10/15 0930  ceFAZolin (ANCEF) IVPB 2  g/50 mL premix  Status:  Discontinued     2 g 100 mL/hr over 30 Minutes Intravenous On call 07/10/15 0981 07/11/15 2144      Medications: Scheduled Meds: .  ceFAZolin (ANCEF) IV  2 g Intravenous On Call  . cefTRIAXone (ROCEPHIN)  IV  2 g Intravenous Q24H  . DAPTOmycin (CUBICIN)  IV  580 mg Intravenous Q48H  . gentamicin irrigation  80 mg Irrigation On Call   Continuous Infusions: . sodium chloride Stopped (07/18/15 0614)  . sodium chloride 50 mL/hr at 07/18/15 0800   PRN Meds:.acetaminophen, hydrALAZINE, ondansetron (ZOFRAN) IV, traMADol    Objective: Weight change:   Intake/Output Summary (Last 24 hours) at 07/18/15 1206 Last data filed at 07/18/15 1100  Gross per 24 hour  Intake 2000.83 ml  Output   1700 ml  Net 300.83 ml   Blood pressure 176/67, pulse 80, temperature 98.6 F (37 C), temperature source Oral, resp. rate 28, height  (1.702 m), weight 160 lb (72.576 kg), SpO2 97 %. Temp:  [98.2 F (36.8 C)-99.2 F (37.3 C)] 98.6 F (37 C) (02/27 1129) Pulse Rate:  [80] 80 (  02/27 0400) Resp:  [13-28] 28 (02/27 0730) BP: (145-188)/(60-71) 176/67 mmHg (02/27 0730) SpO2:  [95 %-100 %] 97 % (02/27 0730)  Physical Exam: General: Alert and awake, oriented person place Cardiovascular: regular rate, normal r, no murmur rubs or gallops Pulmonary: clear to auscultation bilaterally, no wheezing, rales or rhonchi Gastrointestinal: soft nontender, nondistended, normal bowel sounds, Musculoskeletal: no clubbing or edema noted bilaterally Skin, bandage over area of PM extraction Neuro: nonfocal, strength and sensation intact  CBC:  CBC Latest Ref Rng 07/18/2015 07/14/2015 07/13/2015  WBC 4.0 - 10.5 K/uL 7.9 8.5 10.6(H)  Hemoglobin 12.0 - 15.0 g/dL 7.8(L) 8.5(L) 8.6(L)  Hematocrit 36.0 - 46.0 % 22.8(L) 25.7(L) 26.0(L)  Platelets 150 - 400 K/uL 178 114(L) 95(L)       BMET  Recent Labs  07/16/15 0021 07/18/15 1034  NA 138 139  K 3.7 3.5  CL 100* 105  CO2 25 24    GLUCOSE 104* 99  BUN 31* 22*  CREATININE 1.40* 1.26*  CALCIUM 8.4* 8.2*     Liver Panel  No results for input(s): PROT, ALBUMIN, AST, ALT, ALKPHOS, BILITOT, BILIDIR, IBILI in the last 72 hours.     Sedimentation Rate No results for input(s): ESRSEDRATE in the last 72 hours. C-Reactive Protein No results for input(s): CRP in the last 72 hours.  Micro Results: Recent Results (from the past 720 hour(s))  MRSA PCR Screening     Status: None   Collection Time: 07/11/15 10:00 PM  Result Value Ref Range Status   MRSA by PCR NEGATIVE NEGATIVE Final    Comment:        The GeneXpert MRSA Assay (FDA approved for NASAL specimens only), is one component of a comprehensive MRSA colonization surveillance program. It is not intended to diagnose MRSA infection nor to guide or monitor treatment for MRSA infections.   Culture, blood (Routine X 2) w Reflex to ID Panel     Status: None (Preliminary result)   Collection Time: 07/15/15 10:53 AM  Result Value Ref Range Status   Specimen Description BLOOD BLOOD RIGHT ARM  Final   Special Requests BOTTLES DRAWN AEROBIC AND ANAEROBIC 10CC  Final   Culture NO GROWTH 2 DAYS  Final   Report Status PENDING  Incomplete  Culture, blood (Routine X 2) w Reflex to ID Panel     Status: None (Preliminary result)   Collection Time: 07/15/15 11:00 AM  Result Value Ref Range Status   Specimen Description BLOOD BLOOD RIGHT HAND  Final   Special Requests BOTTLES DRAWN AEROBIC AND ANAEROBIC 10CC  Final   Culture NO GROWTH 2 DAYS  Final   Report Status PENDING  Incomplete  Surgical pcr screen     Status: None   Collection Time: 07/18/15  1:05 AM  Result Value Ref Range Status   MRSA, PCR NEGATIVE NEGATIVE Final   Staphylococcus aureus NEGATIVE NEGATIVE Final    Comment:        The Xpert SA Assay (FDA approved for NASAL specimens in patients over 66 years of age), is one component of a comprehensive surveillance program.  Test performance has been  validated by [facility] for patients greater than or equal to 66 year old. It is not intended to diagnose infection nor to guide or monitor treatment.     Studies/Results: No results found.    Assessment/Plan:  INTERVAL HISTORY:   All cultures have remained negative   Active Problems:   Complete heart block (HCC)   Pacemaker infection (HCC)  Dementia with behavioral disturbance    Tiffany [name] is a 82 y.o. female with PM infection due to erosion of lead through skin. She has not been systemically ill and we have never had documentation of bacteremia though no blood cultures were drawn until after she had been on keflex and cefazolin  #1 PM infection that appears to be only isoalted to break down and exposure of leads which of course DID require extraction  I have reconsidered her case , re examined existing recommendations, literature re device infections with only lead exposure  I have changed my mind re duration of IV abx and would like to opt now instead for 2 weeks of IV antibiotics  After new device insertion.  I really do not want her back in the hospital with recurrence of infection at this site or worse in her blood stream  I will put in an order for PICC line placement  I think using daptomicin ALONE should be sufficient since this was a lead exposure and skin flora such as coag Neg staph, MRSA, MSSA and strep are most likely to be in play here  I will dc ceftriaxone  My understanding is that she will go to SNF  I will otherwise sign off for now  Please let me know if you would like [place] to see her in followup  Diagnosis: PM infection  Culture Result: NGROWTH  No Known Allergies  Discharge antibiotics: Per pharmacy protocol daptomicin    Duration:  2 weeks  End Date:  March 14  [facility] Care Per Protocol:  Labs weekly while on IV antibiotics: _x_ CBC with differential _x_ BMP X_CPK  Fax weekly labs to [phone]       LOS: 7  days   Acey Lav 07/18/2015, 12:06 PM

## 2015-07-19 ENCOUNTER — Inpatient Hospital Stay (HOSPITAL_COMMUNITY): Payer: Medicare Other

## 2015-07-19 ENCOUNTER — Encounter (HOSPITAL_COMMUNITY): Payer: Self-pay | Admitting: Internal Medicine

## 2015-07-19 LAB — CBC
HEMATOCRIT: 22.4 % — AB (ref 36.0–46.0)
HEMOGLOBIN: 7.4 g/dL — AB (ref 12.0–15.0)
MCH: 32 pg (ref 26.0–34.0)
MCHC: 33 g/dL (ref 30.0–36.0)
MCV: 97 fL (ref 78.0–100.0)
Platelets: 175 10*3/uL (ref 150–400)
RBC: 2.31 MIL/uL — AB (ref 3.87–5.11)
RDW: 13.8 % (ref 11.5–15.5)
WBC: 7 10*3/uL (ref 4.0–10.5)

## 2015-07-19 LAB — PREPARE RBC (CROSSMATCH)

## 2015-07-19 LAB — BASIC METABOLIC PANEL
ANION GAP: 10 (ref 5–15)
BUN: 23 mg/dL — ABNORMAL HIGH (ref 6–20)
CALCIUM: 8.4 mg/dL — AB (ref 8.9–10.3)
CO2: 23 mmol/L (ref 22–32)
Chloride: 107 mmol/L (ref 101–111)
Creatinine, Ser: 1.2 mg/dL — ABNORMAL HIGH (ref 0.44–1.00)
GFR, EST AFRICAN AMERICAN: 44 mL/min — AB (ref >60)
GFR, EST NON AFRICAN AMERICAN: 38 mL/min — AB (ref >60)
Glucose, Bld: 97 mg/dL (ref 65–99)
POTASSIUM: 3.3 mmol/L — AB (ref 3.5–5.1)
SODIUM: 140 mmol/L (ref 135–145)

## 2015-07-19 LAB — GLUCOSE, CAPILLARY: GLUCOSE-CAPILLARY: 81 mg/dL (ref 65–99)

## 2015-07-19 MED ORDER — FUROSEMIDE 40 MG PO TABS
40.0000 mg | ORAL_TABLET | Freq: Once | ORAL | Status: AC
  Administered 2015-07-19: 40 mg via ORAL
  Filled 2015-07-19: qty 1

## 2015-07-19 MED ORDER — POTASSIUM CHLORIDE CRYS ER 20 MEQ PO TBCR
40.0000 meq | EXTENDED_RELEASE_TABLET | Freq: Once | ORAL | Status: AC
  Administered 2015-07-19: 40 meq via ORAL
  Filled 2015-07-19: qty 2

## 2015-07-19 MED ORDER — SODIUM CHLORIDE 0.9 % IV SOLN: Freq: Once | INTRAVENOUS | Status: DC

## 2015-07-19 MED ORDER — COLCHICINE 0.6 MG PO TABS
0.6000 mg | ORAL_TABLET | Freq: Every day | ORAL | Status: DC
  Administered 2015-07-19 – 2015-07-20 (×2): 0.6 mg via ORAL
  Filled 2015-07-19 (×2): qty 1

## 2015-07-19 MED ORDER — FUROSEMIDE 10 MG/ML IJ SOLN: 40.0000 mg | Freq: Once | INTRAMUSCULAR | Status: DC

## 2015-07-19 MED ORDER — TRAZODONE HCL 50 MG PO TABS
25.0000 mg | ORAL_TABLET | Freq: Once | ORAL | Status: AC
  Administered 2015-07-19: 25 mg via ORAL
  Filled 2015-07-19: qty 1

## 2015-07-19 NOTE — Progress Notes (Signed)
CSW spoke with pt son regarding PT recommendation for SNF.  Pt son is not agreeable to SNF placement at this time.  Son plans on moving in with patient while she recovers- states he has done this often in the past.  Son also states that he is comfortable taking care of IV is she is sent home with that.  CSW informed RNCM of son desire to return home  CSW signing off at this time- please reconsult if needed  Merlyn Lot, [facility] Clinical Social Worker [phone]

## 2015-07-19 NOTE — Progress Notes (Addendum)
Pt c/o being unable to sleep. She has received tramadol, been repositioned in bed, offered warm drink without success. Pt has called multiple times asking for sleep aid. MD on call pged.   Spoke with MD and 25mg  trazadone given with tylenol with good effect. Pt able to sleep shortly after receiving medication.

## 2015-07-19 NOTE — Progress Notes (Signed)
Physical Therapy Treatment Patient Details Name: Tiffany [name] MRN: [id number] DOB: 1922-08-18 Today's Date: 07/19/2015    History of Present Illness Patient is a 82 yo female admitted 07/11/15 with pacemaker lead/pocket infection, now s/p lead removal and placement of temp pacing wire.  (In setting of complete heart block)  Patient for new pacemaker placement 07/18/15.   PMH:  NICM, pacemaker/ICK, complete heart block, HTN, CHF, HOH    PT Comments    Patient progressing slowly towards PT goals. Pain in L LE is limiting standing and mobility. Requires assist of 2 to transfer to chair. Pt very anxious. Appropriate for ST SNF to maximize independence and mobility. Will follow acutely.   Follow Up Recommendations  SNF     Equipment Recommendations  Rolling walker with 5" wheels    Recommendations for Other Services       Precautions / Restrictions Precautions Precautions: Fall;ICD/Pacemaker Precaution Comments: Patient reports fall at home at mailbox Restrictions Weight Bearing Restrictions: No    Mobility  Bed Mobility Overal bed mobility: Needs Assistance Bed Mobility: Supine to Sit     Supine to sit: Mod assist;HOB elevated     General bed mobility comments: assist with pad to advance hips to EOB, assist for trunk  Transfers Overall transfer level: Needs assistance Equipment used: 2 person hand held assist Transfers: Sit to/from Stand;Stand Pivot Transfers Sit to Stand: Mod assist;+2 physical assistance Stand pivot transfers: Min assist;+2 physical assistance       General transfer comment: Used gait and bed pad to assist with rising, pt able to take shuffling steps to chair, assist to control descent. Reluctant to place weight through LLE due to pain.  Ambulation/Gait                 Stairs            Wheelchair Mobility    Modified Rankin (Stroke Patients Only)       Balance Overall balance assessment: Needs assistance Sitting-balance  support: Feet supported;No upper extremity supported Sitting balance-Leahy Scale: Fair     Standing balance support: During functional activity;Bilateral upper extremity supported Standing balance-Leahy Scale: Poor Standing balance comment: Not able to stand without external support of 2 for balance.                    Cognition Arousal/Alertness: Awake/alert Behavior During Therapy: Anxious Overall Cognitive Status: Impaired/Different from baseline Area of Impairment: Safety/judgement;Orientation Orientation Level: Disoriented to;Time   Memory: Decreased short-term memory   Safety/Judgement: Decreased awareness of deficits   Problem Solving: Slow processing;Difficulty sequencing;Requires verbal cues General Comments: Pt thinks she has been in the hospital for 3 days, frustrated that now she can no longer walk.    Exercises      General Comments        Pertinent Vitals/Pain Pain Assessment: Faces Faces Pain Scale: Hurts even more Pain Location: L LE Pain Descriptors / Indicators: Grimacing;Guarding;Sore Pain Intervention(s): Monitored during session;Repositioned;Limited activity within patient's tolerance    Home Living Family/patient expects to be discharged to:: Skilled nursing facility Living Arrangements: Alone                  Prior Function Level of Independence: Independent          PT Goals (current goals can now be found in the care plan section) Acute Rehab PT Goals Patient Stated Goal: go home to her cat Progress towards PT goals: Progressing toward goals    Frequency  Min  3X/week    PT Plan Current plan remains appropriate    Co-evaluation PT/OT/SLP Co-Evaluation/Treatment: Yes Reason for Co-Treatment: For patient/therapist safety         End of Session Equipment Utilized During Treatment: Gait belt Activity Tolerance: Patient limited by pain Patient left: in chair;with call bell/phone within reach     Time: 1211-1238 PT  Time Calculation (min) (ACUTE ONLY): 27 min  Charges:  $Therapeutic Activity: 8-22 mins                    G Codes:      Kashaun Bebo A Hicks Feick 07/19/2015, 3:20 PM Mylo Red, PT, DPT [phone]

## 2015-07-19 NOTE — Progress Notes (Signed)
Patient son called wanting patient to walk, I explained that PT is coming in to work with her and assess her ability to ambulate. Teresa Coombs 11:36 AM

## 2015-07-19 NOTE — Progress Notes (Signed)
Arrived to start PIV however pt at this time refusing to have it inserted now.  RN made aware.

## 2015-07-19 NOTE — Progress Notes (Signed)
Patient siting up in bed, no pain, distress at this time. Running a low grade fever (100.7). Tylenol given, call light within reach.

## 2015-07-19 NOTE — Progress Notes (Signed)
Utilization review completed.  

## 2015-07-19 NOTE — Evaluation (Signed)
Occupational Therapy Evaluation Patient Details Name: Tiffany [name] MRN: [id number] DOB: 11-05-1922 Today's Date: 07/19/2015    History of Present Illness Patient is a 82 yo female admitted 07/11/15 with pacemaker lead/pocket infection, now s/p lead removal and placement of temp pacing wire.  (In setting of complete heart block)  Patient for new pacemaker placement 07/18/15.   PMH:  NICM, pacemaker/ICK, complete heart block, HTN, CHF, HOH   Clinical Impression   Pt was independent prior to admission.  Presents with generalized weakness, anxiety, L LE pain, pacemaker precautions, decreased balance and impaired cognition. She requires +2 assistance to pivot to chair. Pt is not able to ambulate. Will need ST rehab in SNF prior to return home.      Follow Up Recommendations  SNF;Supervision/Assistance - 24 hour    Equipment Recommendations       Recommendations for Other Services       Precautions / Restrictions Precautions Precautions: Fall;ICD/Pacemaker Restrictions Weight Bearing Restrictions: No      Mobility Bed Mobility Overal bed mobility: Needs Assistance Bed Mobility: Supine to Sit     Supine to sit: Mod assist;HOB elevated     General bed mobility comments: assist with pad to advance hips to EOB, assist for trunk  Transfers Overall transfer level: Needs assistance Equipment used: 2 person hand held assist Transfers: Sit to/from Stand;Stand Pivot Transfers Sit to Stand: Mod assist;+2 physical assistance Stand pivot transfers: Min assist;+2 physical assistance       General transfer comment: Used gait and bed pad to assist with rising, pt able to take shuffling steps to chair, assist to control descent.    Balance Overall balance assessment: Needs assistance Sitting-balance support: Feet supported Sitting balance-Leahy Scale: Fair       Standing balance-Leahy Scale: Poor                              ADL Overall ADL's : Needs  assistance/impaired Eating/Feeding: Set up;Sitting   Grooming: Sitting;Set up   Upper Body Bathing: Minimal assitance;Sitting   Lower Body Bathing: +2 for physical assistance;Total assistance;Sit to/from stand   Upper Body Dressing : Minimal assistance;Sitting   Lower Body Dressing: +2 for physical assistance;Total assistance;Sit to/from stand   Toilet Transfer: +2 for safety/equipment;Moderate assistance;Stand-pivot;BSC   Toileting- Clothing Manipulation and Hygiene: +2 for physical assistance;Total assistance;Sit to/from stand       Functional mobility during ADLs:  (not able to ambulate)       Vision     Perception     Praxis      Pertinent Vitals/Pain Pain Assessment: Faces Faces Pain Scale: Hurts even more Pain Location: L LE Pain Descriptors / Indicators: Guarding;Grimacing;Sore Pain Intervention(s): Limited activity within patient's tolerance;Monitored during session;Repositioned     Hand Dominance Right   Extremity/Trunk Assessment Upper Extremity Assessment Upper Extremity Assessment: LUE deficits/detail LUE Deficits / Details: pacemaker precautions, reports hx of fx and limited use, did not assess shoulder   Lower Extremity Assessment Lower Extremity Assessment: Defer to PT evaluation   Cervical / Trunk Assessment Cervical / Trunk Assessment: Kyphotic   Communication Communication Communication: HOH   Cognition Arousal/Alertness: Awake/alert Behavior During Therapy: Anxious Overall Cognitive Status: Impaired/Different from baseline Area of Impairment: Safety/judgement;Memory;Problem solving Orientation Level: Disoriented to;Time   Memory: Decreased short-term memory   Safety/Judgement: Decreased awareness of deficits   Problem Solving: Slow processing;Difficulty sequencing;Requires verbal cues General Comments: Pt thinks she has been in the hospital for 3  days, frustrated that now she can no longer walk.   General Comments        Exercises       Shoulder Instructions      Home Living Family/patient expects to be discharged to:: Skilled nursing facility Living Arrangements: Alone                                      Prior Functioning/Environment Level of Independence: Independent             OT Diagnosis: Generalized weakness;Acute pain;Cognitive deficits   OT Problem List: Decreased strength;Decreased activity tolerance;Impaired balance (sitting and/or standing);Decreased cognition;Decreased safety awareness;Decreased knowledge of use of DME or AE;Impaired UE functional use;Pain   OT Treatment/Interventions:      OT Goals(Current goals can be found in the care plan section) Acute Rehab OT Goals Patient Stated Goal: go home to her cat  OT Frequency:     Barriers to D/C:            Co-evaluation              End of Session Equipment Utilized During Treatment: Gait belt Nurse Communication: Mobility status  Activity Tolerance: Patient limited by pain (and fear of falling) Patient left: in chair;with call bell/phone within reach;with nursing/sitter in room   Time: 1212-1238 OT Time Calculation (min): 26 min Charges:  OT General Charges $OT Visit: 1 Procedure OT Evaluation $OT Eval Moderate Complexity: 1 Procedure G-Codes:    Evern Bio 07/19/2015, 1:22 PM  [phone]

## 2015-07-19 NOTE — Progress Notes (Signed)
Ladona Ridgel MD will place orders to have PICC line placed in AM. Unable to get IV on patient despite multiple attempts from multiple people. Blood and IV Abx not given MD aware. Bianka Liberati Milas Gain 6:10 PM

## 2015-07-19 NOTE — Progress Notes (Signed)
Patient ID: Tiffany [name], female   DOB: December 15, 1922, 82 y.o.   MRN: [id number]    Patient Name: Tiffany [name] Date of Encounter: 07/19/2015     Active Problems:   Complete heart block (HCC)   Pacemaker infection (HCC)   Dementia with behavioral disturbance    SUBJECTIVE She reports feeling well this morning, denies any CP, palpitations or SOB, no new concerns.  CURRENT MEDS . sodium chloride   Intravenous Once  . furosemide  40 mg Intravenous Once  . furosemide  40 mg Intravenous Once  . oritavancin (ORBACTIV) IVPB  1,200 mg Intravenous Once  . sodium chloride flush  3 mL Intravenous Q12H    OBJECTIVE  Filed Vitals:   07/18/15 1725 07/18/15 1801 07/18/15 2125 07/19/15 0648  BP: 192/61 164/70 160/74 143/59  Pulse: 88 86 96 84  Temp:  99.8 F (37.7 C) 99.1 F (37.3 C) 97.8 F (36.6 C)  TempSrc:  Oral Oral Oral  Resp: 18 18 18 18   Height:      Weight:      SpO2: 96% 97% 98% 99%    Intake/Output Summary (Last 24 hours) at 07/19/15 1140 Last data filed at 07/19/15 1028  Gross per 24 hour  Intake    343 ml  Output   1175 ml  Net   -832 ml   Filed Weights   07/11/15 1350  Weight: 160 lb (72.576 kg)    PHYSICAL EXAM  General: Pleasant, elderly WF in NAD. Neuro: Alert and oriented X 3. Moves all extremities spontaneously. Psych: Normal affect. HEENT:  Normal  Neck: Supple without bruits or JVD. Lungs:  Resp regular and unlabored, CTA. Heart: RRR no significant murmurs are appreciated Abdomen: Soft, non-tender, non-distended  Extremities: No clubbing, cyanosis, +1 edema.   Right chest wound (extraction site) is clean and dry, + ecchymosis, no heamtoma.  Left chest (implant site) dressing is dry, non-tender, no hematoma/swelling   Basic Metabolic Panel  Recent Labs  07/18/15 1034 07/19/15 0450  NA 139 140  K 3.5 3.3*  CL 105 107  CO2 24 23  GLUCOSE 99 97  BUN 22* 23*  CREATININE 1.26* 1.20*  CALCIUM 8.2* 8.4*   Cardiac Enzymes  Recent Labs  07/18/15 0531  CKTOTAL 28*   TELE Ventricular pacing      ASSESSMENT AND PLAN  1. PM/ICD system infection      s/p extraction on 07/11/15 of dual coil ICD lead, a left ventricular pacing lead, and 2 old atrial and ventricular past fixation pacing leads by Dr. Ladona Ridgel     On Rocephin, Daptomycin      Negative blood cultures (x2) x3  days so far      Now s/p PPM imlpant by Dr. Johney Frame yesterday      CXR is without pneumothorax      Device interrogation this morning with normal function.  2. CHB       she is s/p placement of a temp perm PPM via the right IJ.      Venogram shows patency of veins on the left side.  Pacemaker planned for today with Dr. Johney Frame.       BP stable     Evidence of fluid OL, will diurese today along with PRBC transfusion  3. Weakness -       evaluated by PT yesterday, planned for SNF at discharge, though patient at this time voices her preference is to go to her home.  4. Acute on chronic renal  failure      Stable, and improved  5. Anemia     Will transfuse 1 unit today, patient agrees     Lasix  before and after     d/w blood bank, confirmed orders placed and correct they have type/screen/crossmatch orders   Francis Dowse, PA-C  07/19/2015 11:40 AM   EP Attending  Patient seen and examined. CV - RRR, Lungs - clear, Abd. - soft, NTND, Ext - 1+ edema Tele - NSR with ventricular pacing.  A/P 1. PM infection - s/p extraction 2. CHB - s/p new DDD PM 3. Acute volume overloade/systolic/diastolic CHF - she will get lasix 4. Anemia - will transfuse has HGB 7.  5. Disp. - hopefully home or to a SNF. Await PT/OT final thoughts.  [name].D.

## 2015-07-20 DIAGNOSIS — M1000 Idiopathic gout, unspecified site: Secondary | ICD-10-CM

## 2015-07-20 DIAGNOSIS — I5041 Acute combined systolic (congestive) and diastolic (congestive) heart failure: Secondary | ICD-10-CM

## 2015-07-20 LAB — CULTURE, BLOOD (ROUTINE X 2)
CULTURE: NO GROWTH
Culture: NO GROWTH

## 2015-07-20 LAB — BASIC METABOLIC PANEL
Anion gap: 9 (ref 5–15)
BUN: 22 mg/dL — AB (ref 6–20)
CALCIUM: 8.5 mg/dL — AB (ref 8.9–10.3)
CO2: 24 mmol/L (ref 22–32)
CREATININE: 1.16 mg/dL — AB (ref 0.44–1.00)
Chloride: 104 mmol/L (ref 101–111)
GFR calc non Af Amer: 40 mL/min — ABNORMAL LOW (ref >60)
GFR, EST AFRICAN AMERICAN: 46 mL/min — AB (ref >60)
Glucose, Bld: 91 mg/dL (ref 65–99)
Potassium: 3.6 mmol/L (ref 3.5–5.1)
SODIUM: 137 mmol/L (ref 135–145)

## 2015-07-20 LAB — CBC
HCT: 24.8 % — ABNORMAL LOW (ref 36.0–46.0)
Hemoglobin: 8.2 g/dL — ABNORMAL LOW (ref 12.0–15.0)
MCH: 31.9 pg (ref 26.0–34.0)
MCHC: 33.1 g/dL (ref 30.0–36.0)
MCV: 96.5 fL (ref 78.0–100.0)
Platelets: 197 10*3/uL (ref 150–400)
RBC: 2.57 MIL/uL — ABNORMAL LOW (ref 3.87–5.11)
RDW: 13.8 % (ref 11.5–15.5)
WBC: 8.2 10*3/uL (ref 4.0–10.5)

## 2015-07-20 MED ORDER — FUROSEMIDE 40 MG PO TABS
40.0000 mg | ORAL_TABLET | Freq: Every day | ORAL | Status: DC
  Administered 2015-07-20 – 2015-07-22 (×3): 40 mg via ORAL
  Filled 2015-07-20 (×3): qty 1

## 2015-07-20 MED ORDER — LINEZOLID 600 MG PO TABS
600.0000 mg | ORAL_TABLET | Freq: Two times a day (BID) | ORAL | Status: DC
  Administered 2015-07-20 – 2015-07-22 (×5): 600 mg via ORAL
  Filled 2015-07-20 (×7): qty 1

## 2015-07-20 MED ORDER — COLCHICINE 0.6 MG PO TABS
0.6000 mg | ORAL_TABLET | Freq: Two times a day (BID) | ORAL | Status: DC
  Administered 2015-07-20 – 2015-07-22 (×4): 0.6 mg via ORAL
  Filled 2015-07-20 (×4): qty 1

## 2015-07-20 NOTE — Progress Notes (Signed)
      INFECTIOUS DISEASE ATTENDING ADDENDUM:   Patient loss IV access and is refusing new PIV  I will put her on zyvox 600mg  bid x 14 days  Please have CM makes sure she can get this medicine  She should have a CBC checked in a week to ensure now TTPenia    Acey Lav 07/20/2015, 10:36 AM

## 2015-07-20 NOTE — Progress Notes (Signed)
Call made to Dr. Clinton Gallant office regarding Zyvox per rounding team request- spoke with Marcelino Duster RN at office- who confirmed with Dr. Daiva Eves that generic Zyvox would be fine for discharge- pt will also need pre-auth which the office only does on an outpt basis- the office is requesting that the rounding inpt team try to call the pre-auth in (and give reasons for using Zyvox as noted in ID notes- pt needs abx with high bio availability and has no iv access for iv meds) number to call is [phone]

## 2015-07-20 NOTE — Progress Notes (Signed)
Insurance check completed on Zyvox Zyvox is not on the formulary, prior Berkley Harvey will be required- copay will be $2,312.03- linezolid-generic 600 mg tablet is available with a copay of  $76.45- prior Berkley Harvey is required for that as well- [phone]

## 2015-07-20 NOTE — Progress Notes (Signed)
Physical Therapy Treatment Patient Details Name: Tiffany [name] MRN: [id number] DOB: 1922/05/31 Today's Date: 07/20/2015    History of Present Illness Patient is a 82 yo female admitted 07/11/15 with pacemaker lead/pocket infection, now s/p lead removal and placement of temp pacing wire.  (In setting of complete heart block)  Patient for new pacemaker placement 07/18/15.   PMH:  NICM, pacemaker/ICK, complete heart block, HTN, CHF, HOH    PT Comments    Patient progressing slowly towards PT goals. With encouragement and coaxing, pt tolerated SPT to [facility] with assist of 2. Able to shuffle feet towards chair on second transfer with assist for balance. Reluctant to place weight through BLEs however tolerated with support. Appropriate for ST SNF. Will follow acutely.   Follow Up Recommendations  SNF     Equipment Recommendations  Rolling walker with 5" wheels    Recommendations for Other Services       Precautions / Restrictions Precautions Precautions: Fall;ICD/Pacemaker Required Braces or Orthoses: Sling Restrictions Weight Bearing Restrictions: No    Mobility  Bed Mobility Overal bed mobility: Needs Assistance Bed Mobility: Supine to Sit     Supine to sit: Mod assist;HOB elevated     General bed mobility comments: assist with pad to advance hips to EOB, assist for trunk  Transfers Overall transfer level: Needs assistance Equipment used: 2 person hand held assist Transfers: Sit to/from [employer] Transfers Sit to Stand: Mod assist;+2 physical assistance Stand pivot transfers: Min assist;+2 physical assistance       General transfer comment: Pt reluctant to place weight through BLEs on first stand; more weight placed through them ons econd stand. Mod A of 2 progressing to Min A of 2 for SPT to [facility].  Ambulation/Gait Ambulation/Gait assistance: Min assist;+2 physical assistance Ambulation Distance (Feet): 3 Feet Assistive device: 2 person hand held assist Gait  Pattern/deviations: Shuffle Gait velocity: decreased   General Gait Details: Able to shuffle feet a few steps from [facility] to chair with posterior lean.   Stairs            Wheelchair Mobility    Modified Rankin (Stroke Patients Only)       Balance Overall balance assessment: Needs assistance Sitting-balance support: Feet supported;No upper extremity supported Sitting balance-Leahy Scale: Fair     Standing balance support: During functional activity;Bilateral upper extremity supported Standing balance-Leahy Scale: Poor Standing balance comment: Not able to stand without assist of 2 for static and dynamic standing balance. Reluctant to place weight through BLEs.                    Cognition Arousal/Alertness: Awake/alert Behavior During Therapy: Anxious Overall Cognitive Status: Impaired/Different from baseline Area of Impairment: Safety/judgement;Problem solving         Safety/Judgement: Decreased awareness of deficits   Problem Solving: Slow processing;Difficulty sequencing;Requires verbal cues General Comments: "I don't think I can walk much today" even though pt not evena ble to stand earlier in day with RN.    Exercises      General Comments        Pertinent Vitals/Pain Pain Assessment: Faces Faces Pain Scale: Hurts even more Pain Location: Bil feet Pain Descriptors / Indicators: Grimacing;Sore;Guarding Pain Intervention(s): Monitored during session;Repositioned;Limited activity within patient's tolerance    Home Living                      Prior Function            PT Goals (  current goals can now be found in the care plan section) Progress towards PT goals: Progressing toward goals    Frequency  Min 3X/week    PT Plan Current plan remains appropriate    Co-evaluation             End of Session Equipment Utilized During Treatment: Gait belt Activity Tolerance: Patient limited by pain Patient left: in chair;with call  bell/phone within reach     Time: 1425-1448 PT Time Calculation (min) (ACUTE ONLY): 23 min  Charges:  $Therapeutic Activity: 23-37 mins                    G Codes:      Maston Wight A Braxxton Stoudt 07/20/2015, 3:22 PM  Mylo Red, PT, DPT [phone]

## 2015-07-20 NOTE — Progress Notes (Signed)
Patient ID: Tiffany [name], female   DOB: September 14, 1922, 82 y.o.   MRN: [id number]    Patient Name: Tiffany [name] Date of Encounter: 07/20/2015     Active Problems:   Complete heart block (HCC)   Pacemaker infection (HCC)   Dementia with behavioral disturbance    SUBJECTIVE  C/o left lower leg pain. CURRENT MEDS . sodium chloride   Intravenous Once  . sodium chloride   Intravenous Once  . colchicine  0.6 mg Oral Daily  . furosemide  40 mg Intravenous Once  . furosemide  40 mg Intravenous Once  . oritavancin (ORBACTIV) IVPB  1,200 mg Intravenous Once  . sodium chloride flush  3 mL Intravenous Q12H    OBJECTIVE  Filed Vitals:   07/18/15 2125 07/19/15 0648 07/19/15 2009 07/20/15 0649  BP: 160/74 143/59 147/65 157/70  Pulse: 96 84 103 92  Temp: 99.1 F (37.3 C) 97.8 F (36.6 C) 100.7 F (38.2 C) 98.1 F (36.7 C)  TempSrc: Oral Oral Oral Oral  Resp: 18 18 17 17   Height:      Weight:      SpO2: 98% 99% 97% 96%    Intake/Output Summary (Last 24 hours) at 07/20/15 0828 Last data filed at 07/20/15 0650  Gross per 24 hour  Intake      0 ml  Output   1325 ml  Net  -1325 ml   Filed Weights   07/11/15 1350  Weight: 160 lb (72.576 kg)    PHYSICAL EXAM  General: Pleasant, NAD. Neuro: Alert and oriented X 3. Moves all extremities spontaneously. Psych: Normal affect. HEENT:  Normal  Neck: Supple without bruits or JVD. Lungs:  Resp regular and unlabored, CTA. Heart: RRR no s3, s4, or murmurs. Abdomen: Soft, non-tender, non-distended, BS + x 4.  Extremities: No clubbing, cyanosis or edema. DP/PT/Radials 2+ and equal bilaterally. Left ankle tender and warm  Accessory Clinical Findings  CBC  Recent Labs  07/19/15 0450 07/20/15 0329  WBC 7.0 8.2  HGB 7.4* 8.2*  HCT 22.4* 24.8*  MCV 97.0 96.5  PLT 175 197   Basic Metabolic Panel  Recent Labs  07/19/15 0450 07/20/15 0329  NA 140 137  K 3.3* 3.6  CL 107 104  CO2 23 24  GLUCOSE 97 91  BUN 23* 22*    CREATININE 1.20* 1.16*  CALCIUM 8.4* 8.5*   Liver Function Tests No results for input(s): AST, ALT, ALKPHOS, BILITOT, PROT, ALBUMIN in the last 72 hours. No results for input(s): LIPASE, AMYLASE in the last 72 hours. Cardiac Enzymes  Recent Labs  07/18/15 0531  CKTOTAL 28*   BNP Invalid input(s): POCBNP D-Dimer No results for input(s): DDIMER in the last 72 hours. Hemoglobin A1C No results for input(s): HGBA1C in the last 72 hours. Fasting Lipid Panel No results for input(s): CHOL, HDL, LDLCALC, TRIG, CHOLHDL, LDLDIRECT in the last 72 hours. Thyroid Function Tests No results for input(s): TSH, T4TOTAL, T3FREE, THYROIDAB in the last 72 hours.  Invalid input(s): FREET3  TELE  nsr with ventricular pacing  Radiology/Studies  Dg Chest 2 View  07/19/2015  CLINICAL DATA:  Status post pacemaker placement. EXAM: CHEST  2 VIEW COMPARISON:  Portable chest x-ray dated July 11, 2015 FINDINGS: The right-sided pacemaker has been removed. The new pacemaker generator overlies the left pectoral region. Positioning of the electrodes is radiographically good. The lungs are adequately inflated. There is no pneumothorax or new pleural effusion. The pulmonary interstitial markings remain increased diffusely. The cardiac silhouette  remains enlarged. The pulmonary vascularity is mildly engorged. There is severe degenerative change of the left shoulder. IMPRESSION: There is no immediate postprocedure complication following pacemaker replacement. There is persistent mild pulmonary interstitial edema secondary to CHF with underlying COPD/ reactive airway disease. Electronically Signed   By: David  [place] M.D.   On: 07/19/2015 07:54   Dg Chest Port 1 View  07/11/2015  CLINICAL DATA:  New pacemaker insertion. EXAM: PORTABLE CHEST 1 VIEW COMPARISON:  03/27/2013 FINDINGS: The heart is enlarged but stable. The prior pacemaker and pacer wires have been removed. There is a new single right ventricular pacer  wire via a right internal jugular approach. There is also a femoral vein catheter coursing up into the right atrium and right ventricle. IMPRESSION: Removal of prior ventricular pacer wires and placement of a single right ventricular pacer wire. A femoral line is seen extending into the right ventricle. Electronically Signed   By: Rudie Meyer M.D.   On: 07/11/2015 21:44    ASSESSMENT AND PLAN  1. PPM infection, s/p extraction 2. CHB, s/p re-implantation of her PPM on the contralateral side 3. Gout - will increase colchicine to twice a day 4. Acute systolic/diastolic CHF - her fluid is better after diuresis yesterday. Will continue low dose oral lasix 5. Anemia - her hgb is up today. Will cancel order for PICC and transfusion 6. Disposition - she is approaching discharge. She is not interested in going to a SNF. Will ask for input from her son.  Gregg Taylor,M.D.  Gregg Taylor,M.D.  07/20/2015 8:28 AM

## 2015-07-20 NOTE — Progress Notes (Signed)
Attempted assist x 2 to steady as patient insisting on going to the bathroom.  However, unable to tolerate any pressure on her feet.  Attempt aborted, assisted back into bed, on and off the bed pan to urinate.  Will con't plan of care.

## 2015-07-20 NOTE — Progress Notes (Signed)
Pt requested help to bathroom for BM, stating "I can't do that on a bedpan."  After review of PT's note from yesterday i did not feel comfortable walking her to the bathroom so offered bedside commode.  Pt refused and said she could "hold it."  Await PT.  Also informed RNCM of new Zyvox order.  Will con't plan of care.

## 2015-07-21 ENCOUNTER — Encounter: Payer: Medicare Other | Admitting: Internal Medicine

## 2015-07-21 NOTE — Progress Notes (Signed)
Patient ID: Tiffany [name], female   DOB: Mar 10, 1923, 82 y.o.   MRN: [id number]    Patient Name: Tiffany [name] Date of Encounter: 07/21/2015     Active Problems:   Complete heart block (HCC)   Pacemaker infection (HCC)   Dementia with behavioral disturbance    SUBJECTIVE  Denies chest pain and wants to go home. Ankle pain has resolved.  CURRENT MEDS . colchicine  0.6 mg Oral BID  . furosemide  40 mg Oral Daily  . linezolid  600 mg Oral Q12H  . sodium chloride flush  3 mL Intravenous Q12H    OBJECTIVE  Filed Vitals:   07/20/15 0649 07/20/15 1604 07/20/15 2140 07/21/15 0642  BP: 157/70 121/77 149/59 151/64  Pulse: 92 93 98 92  Temp: 98.1 F (36.7 C) 98.1 F (36.7 C) 98.5 F (36.9 C) 98.4 F (36.9 C)  TempSrc: Oral Oral Oral Oral  Resp: Height:      Weight:      SpO2: 96% 97% 96% 97%    Intake/Output Summary (Last 24 hours) at 07/21/15 0755 Last data filed at 07/20/15 1305  Gross per 24 hour  Intake    360 ml  Output    450 ml  Net    -90 ml   Filed Weights   07/11/15 1350  Weight: 160 lb (72.576 kg)    PHYSICAL EXAM  General: Pleasant, elderly woman, NAD. Neuro: Alert and oriented X 3. Moves all extremities spontaneously. Psych: Normal affect. HEENT:  Normal  Neck: Supple without bruits or JVD. Lungs:  Resp regular and unlabored, CTA. Heart: RRR no s3, s4, or murmurs. Abdomen: Soft, non-tender, non-distended, BS + x 4.  Extremities: No clubbing, cyanosis or edema. DP/PT/Radials 2+ and equal bilaterally.  Accessory Clinical Findings  CBC  Recent Labs  07/19/15 0450 07/20/15 0329  WBC 7.0 8.2  HGB 7.4* 8.2*  HCT 22.4* 24.8*  MCV 97.0 96.5  PLT 175 197   Basic Metabolic Panel  Recent Labs  07/19/15 0450 07/20/15 0329  NA 140 137  K 3.3* 3.6  CL 107 104  CO2 23 24  GLUCOSE 97 91  BUN 23* 22*  CREATININE 1.20* 1.16*  CALCIUM 8.4* 8.5*   Liver Function Tests No results for input(s): AST, ALT, ALKPHOS, BILITOT, PROT,  ALBUMIN in the last 72 hours. No results for input(s): LIPASE, AMYLASE in the last 72 hours. Cardiac Enzymes No results for input(s): CKTOTAL, CKMB, CKMBINDEX, TROPONINI in the last 72 hours. BNP Invalid input(s): POCBNP D-Dimer No results for input(s): DDIMER in the last 72 hours. Hemoglobin A1C No results for input(s): HGBA1C in the last 72 hours. Fasting Lipid Panel No results for input(s): CHOL, HDL, LDLCALC, TRIG, CHOLHDL, LDLDIRECT in the last 72 hours. Thyroid Function Tests No results for input(s): TSH, T4TOTAL, T3FREE, THYROIDAB in the last 72 hours.  Invalid input(s): FREET3  TELE  nsr with ventricular pacing  Radiology/Studies  Dg Chest 2 View  07/19/2015  CLINICAL DATA:  Status post pacemaker placement. EXAM: CHEST  2 VIEW COMPARISON:  Portable chest x-ray dated July 11, 2015 FINDINGS: The right-sided pacemaker has been removed. The new pacemaker generator overlies the left pectoral region. Positioning of the electrodes is radiographically good. The lungs are adequately inflated. There is no pneumothorax or new pleural effusion. The pulmonary interstitial markings remain increased diffusely. The cardiac silhouette remains enlarged. The pulmonary vascularity is mildly engorged. There is severe degenerative change of the left shoulder. IMPRESSION: There  is no immediate postprocedure complication following pacemaker replacement. There is persistent mild pulmonary interstitial edema secondary to CHF with underlying COPD/ reactive airway disease. Electronically Signed   By: David  [place]and M.D.   On: 07/19/2015 07:54   Dg Chest Port 1 View  07/11/2015  CLINICAL DATA:  New pacemaker insertion. EXAM: PORTABLE CHEST 1 VIEW COMPARISON:  03/27/2013 FINDINGS: The heart is enlarged but stable. The prior pacemaker and pacer wires have been removed. There is a new single right ventricular pacer wire via a right internal jugular approach. There is also a femoral vein catheter coursing up  into the right atrium and right ventricle. IMPRESSION: Removal of prior ventricular pacer wires and placement of a single right ventricular pacer wire. A femoral line is seen extending into the right ventricle. Electronically Signed   By: Rudie Meyer M.D.   On: 07/11/2015 21:44    ASSESSMENT AND PLAN  1. PM system infection - s/p extraction. Note ID rec's for anti-biotics. Will work with case management on this. 2. CHB - s/p temp perm PM followed by insertion of a new TV PM 3. Gout - she is on colchicine and is much improved 4. Anemia - improved 5. Disp. -note PT rec's for SNF. Will work on this today. Hopefully dc tomorrow.  Gregg Taylor,M.D.  07/21/2015 7:55 AM

## 2015-07-21 NOTE — NC FL2 (Signed)
[place] MEDICAID FL2 LEVEL OF CARE SCREENING TOOL     IDENTIFICATION  Patient Name: Tiffany [name] Birthdate: 1922-12-17 Sex: female Admission Date (Current Location): 07/11/2015  [place] and [place] Number:  Haynes Bast [id number] A Facility and Address:  [facility] [place] [place], [address] [place], [place] [address]      Provider Number: [id number]  Attending Physician Name and Address:  Marinus Maw, MD  Relative Name and Phone Number:   Chardonnay Holzmann, son- [phone])    Current Level of Care: Hospital Recommended Level of Care: Skilled Nursing Facility Prior Approval Number:    Date Approved/Denied:   PASRR Number: [id number]  Discharge Plan: SNF    Current Diagnoses: Patient Active Problem List   Diagnosis Date Noted  . Pacemaker infection (HCC)   . Dementia with behavioral disturbance   . Heart block AV complete (HCC) 03/08/2014  . Fall 03/27/2013  . Near syncope 03/27/2013  . UTI (urinary tract infection) 03/27/2013  . Biventricular cardiac pacemaker in situ 02/22/2012  . Insomnia 02/02/2011  . Vitamin D deficiency 02/02/2011  . Impaired glucose tolerance 02/02/2011  . Hearing loss 02/02/2011  . Arthritis   . Glaucoma   . Complete heart block (HCC)   . Osteoporosis   . Allergy   . Preventative health care 01/26/2011  . Status post biventricular pacer-ICD   . Essential hypertension, benign   . Hyperlipidemia   . Hypothyroidism   . Nonischemic cardiomyopathy (HCC)   . Systolic heart failure (HCC)   . Ventricular tachycardia (HCC)     Orientation RESPIRATION BLADDER Height & Weight     Self, Time, Situation, Place  Normal Continent Weight: 160 lb (72.576 kg) Height:   (170.2 cm)  BEHAVIORAL SYMPTOMS/MOOD NEUROLOGICAL BOWEL NUTRITION STATUS      Continent Diet (HEART HEALTHY)  AMBULATORY STATUS COMMUNICATION OF NEEDS Skin   Extensive Assist Verbally Surgical wounds (incision LOCATION: chest; right)                        Personal Care Assistance Level of Assistance  Total care Bathing Assistance: Maximum assistance   Dressing Assistance: Maximum assistance Total Care Assistance: Maximum assistance   Functional Limitations Info             SPECIAL CARE FACTORS FREQUENCY  PT (By licensed PT)     PT Frequency:  (3x/week)              Contractures      Additional Factors Info  Code Status, Allergies Code Status Info:  (PRIOR) Allergies Info:  (NO KNOWN ALLERGIES)           Current Medications (07/21/2015):  This is the current hospital active medication list Current Facility-Administered Medications  Medication Dose Route Frequency Provider Last Rate Last Dose  . 0.9 %  sodium chloride infusion  250 mL Intravenous PRN Hillis Range, MD      . acetaminophen (TYLENOL) tablet 325-650 mg  325-650 mg Oral Q4H PRN Hillis Range, MD   650 mg at 07/19/15 2030  . colchicine tablet 0.6 mg  0.6 mg Oral BID Sheilah Pigeon, PA-C   0.6 mg at 07/21/15 0820  . furosemide (LASIX) tablet 40 mg  40 mg Oral Daily Sheilah Pigeon, PA-C   40 mg at 07/21/15 0820  . hydrALAZINE (APRESOLINE) injection 10 mg  10 mg Intravenous Q6H PRN Joellyn Rued, MD   10 mg at 07/18/15 0010  . HYDROcodone-acetaminophen (NORCO/VICODIN)  5-325 MG per tablet 1-2 tablet  1-2 tablet Oral Q4H PRN Hillis Range, MD   1 tablet at 07/20/15 2341  . linezolid (ZYVOX) tablet 600 mg  600 mg Oral Q12H Randall Hiss, MD   600 mg at 07/21/15 0820  . ondansetron (ZOFRAN) injection 4 mg  4 mg Intravenous Q6H PRN Hillis Range, MD      . sodium chloride flush (NS) 0.9 % injection 3 mL  3 mL Intravenous Q12H Hillis Range, MD   3 mL at 07/19/15 1033  . sodium chloride flush (NS) 0.9 % injection 3 mL  3 mL Intravenous PRN Hillis Range, MD         Discharge Medications: Please see discharge summary for a list of discharge medications.  Relevant Imaging Results:  Relevant Lab Results:   Additional Information  (SSN:  [id number])  Orson Gear, Student-SW [phone]

## 2015-07-21 NOTE — Clinical Social Work Note (Signed)
Clinical Social Work Assessment  Patient Details  Name: Tiffany [name] MRN: [id number] Date of Birth: 03-04-1923  Date of referral:  07/21/15               Reason for consult:  Facility Placement, Discharge Planning                Permission sought to share information with:  Family Supports Permission granted to share information::  Yes, Verbal Permission Granted  Name::      [name] [place])  Agency::   (N/A)  Relationship::   (son- [name])  Solicitor Information:   (([phone])  Housing/Transportation Living arrangements for the past 2 months:  Single Family Home Source of Information:  Adult Children Patient Interpreter Needed:  None Criminal Activity/Legal Involvement Pertinent to Current Situation/Hospitalization:  No - Comment as needed Significant Relationships:  Adult Children Lives with:  Adult Children Do you feel safe going back to the place where you live?  Yes Need for family participation in patient care:  Yes (Comment)  Care giving concerns:   Patient's son has not expressed any care giving concerns at this time.   Social Worker assessment / plan:   BSW intern has spoken with patient's son, [name] by phone to discuss discharge planning. BSW intern has made patient's son aware of SNF recommendations given by PT. BSW intern verified with patient's son that he was agreeable to SNF option per patient's bedside nurse. Patient's son stated "he has not spoken with anyone regarding SNF placement for patient and still plans to take her home." Patient further emphasized that he believes once he gets patient home, she'll be just fine. Patient lives at home alone. However, patient's son plans to move in with patient short-term during recovery time. Patient's son also plans to transport patient by car upon d/c. BSW intern to inform CSW as well as RNCM of patient's current d/c plan. Per patient's physisican, planned d/c for tomorrow, 07/22/15. BSW intern remains available.  Employment status:   Retired Health and safety inspector:  [employer] PT Recommendations:  Skilled Nursing Facility Information / Referral to community resources:  Skilled Nursing Facility  Patient/Family's Response to care:   Patient's son appreciated Social Work intervention from [employer]. Patient's son very pleasant during assessment.  Patient/Family's Understanding of and Emotional Response to Diagnosis, Current Treatment, and Prognosis:   Patient's son was very understanding of patient's current diagnosis and need for further treatment.  Emotional Assessment Appearance:   (unable to assess) Attitude/Demeanor/Rapport:  Unable to Assess Affect (typically observed):  Unable to Assess Orientation:  Oriented to Self, Oriented to Place, Oriented to  Time, Oriented to Situation Alcohol / Substance use:  Not Applicable Psych involvement (Current and /or in the community):  No (Comment)  Discharge Needs  Concerns to be addressed:  No discharge needs identified Readmission within the last 30 days:  No Current discharge risk:  None Barriers to Discharge:  No Barriers Identified   Orson Gear, Student-SW 07/21/2015, 10:45 AM

## 2015-07-21 NOTE — Progress Notes (Signed)
Approval received for Linezolid- coverage effective 05/20/15-05/20/16- if additional info or assistance regarding notice of approval please call customer service at [phone] 24/7

## 2015-07-21 NOTE — Care Management Note (Signed)
Case Management Note Donn Pierini RN, BSN Unit 2W-Case Manager [phone]  Patient Details  Name: Tiffany [name] MRN: [id number] Date of Birth: 02/12/1923  Subjective/Objective:   Pt admitted with pacemaker infection                 Action/Plan: PTA pt lived at home- has son that is close by that checks in on her.- PT recommending STSNF- per CSW son plans to take pt home- does not want STSNF- would like HH - will f/u with son regarding plans for d/c SNF vs Home with [facility] - per rounding team feel like STSNF would be safest plan prior to returning home.   Expected Discharge Date:                  Expected Discharge Plan:  Home w Home Health Services  In-House Referral:  Clinical Social Work  Discharge planning Services  CM Consult  Post Acute Care Choice:    Choice offered to:     DME Arranged:    DME Agency:     HH Arranged:    HH Agency:     Status of Service:  In process, will continue to follow  Medicare Important Message Given:  Yes Date Medicare IM Given:    Medicare IM give by:    Date Additional Medicare IM Given:    Additional Medicare Important Message give by:     If discussed at Long Length of Stay Meetings, dates discussed:    Additional Comments:  Darrold Span, RN 07/21/2015, 3:53 PM

## 2015-07-21 NOTE — Discharge Summary (Addendum)
DISCHARGE SUMMARY    Patient ID: Tiffany [name],  MRN: [id number], DOB/AGE: 1922-07-07 82 y.o.  Admit date: 07/11/2015 Discharge date: 07/22/15  Primary Care Physician: Oliver Barre, MD Primary Cardiologist/Electrophysiologist: Dr. Ladona Ridgel Infectious disease:  Primary Discharge Diagnosis:  Pacemaker infection Gout  Secondary Discharge Diagnosis:  1. Complete Heart block 2. NICM 3. Hypothyroid  No Known Allergies   Procedures This Admission:   07/11/15: CRT-P and lead system extraction, Dr. Ladona Ridgel Conclusion: Successful removal of a dual coil ICD lead, a left ventricular pacing lead, and 2 old atrial and ventricular past fixation pacing leads with successful insertion of a temporary permanent single-chamber pacemaker in a patient with pacemaker pocket infection and underlying complete heart block. It is anticipated that the patient will undergo insertion of a new permanent pacemaker once she becomes more stable.  07/18/15  Implantation of a MDT dual chamber PPM on 07/18/15 by Dr Johney Frame.  The patient received a Medtronic Adapta L model ADDRL 1 (serial number [id number]) pacemaker, and Medtronic model M834804 (serial number [id number]) right atrial lead and a Medtronic model 5076- 58 (serial number [id number]) right ventricular leadThere were no immediate post procedure complications. 2.  CXR on 07/19/15 demonstrated no pneumothorax status post device implantation.  3. Device interrogation 07/19/15 showed normal function  07/18/15 Echocardiogram Study Conclusions - Left ventricle: The cavity size was normal. There was moderate concentric hypertrophy. Systolic function was at the lower limits of normal. The estimated ejection fraction was in the range of 50% to 55%. Possible hypokinesis of the apical myocardium (versus pacing induced asynchrony). Inconsistent AV association limits evaluation of LV diastolic function. Consider pacemaker evaluation. - Ventricular septum:  Septal motion showed abnormal function, dyssynergy, and paradox. - Left atrium: The atrium was mildly dilated. - Right atrium: The atrium was mildly dilated. - Tricuspid valve: There was moderate regurgitation. - Pulmonary arteries: Systolic pressure was moderately increased. PA peak pressure: 54 mm Hg (S). Impressions: - Compared to 2014, there is substantial improvement in LV function.  Brief HPI: Tiffany [name] is a 82 y.o. female was admitted secondary to PPM erosion through the skin for pacemaker system extraction, antibiotic therapy and new PPM implantation.  Past medical history includes CHB, NICM, hypothyroidism.  Hospital Course:  The patient was admitted and underwent extraction of her CRT device on 07/11/15 by Dr. Ladona Ridgel, with a temp-perm system implanted given her CHB.  She was seen and treated by ID with IV antibiotics, daptomycine and ceftriaxone.  During her stay she did develop some ARI on CRI which continued to improve through her hospital stay, and fluid OL was treated with diuretic therapy.  She did become anemic, and planned for PRBC though her IV access was lost and unable to gain another, her H/H improved to >8 and no transfusion or picc line was pursued.  She did diurese with lasix though also had a glout flare and was treated with clochicine with marked improvement in her L foot pain and today is pain free. Her blood cultures remained negative, though she had received antibiotics prior to the lab draw.  She was seen and evaluated by PT during her stay who recommended SNF upon discharge.  Given no IV access ID, Dr. Algis Liming recommended on 07/20/15 to complete a 14 day course of Zyvox 600mg  BID.  Prior authorization was obtained for this from her insurance provider. Her sutures were removed from the extraction site today by Dr. Ladona Ridgel, both wounds the extraction site and implant  site were stable.  The patient's vitals remained stable throughout her hospital stay, she remained  afebrile.  The patient this morning feels well, she has no ongoing R foot pain denies any CP or SOB, her edema is resolved.  Telemetry is SR with V pacing.  VSS, will resume her coreg at home.  We had multiple discussions with the patient regarding best discharge plan is to SNF and case management/social services has been in contact with the patient's son and he wants her to return to home, the patient this morning is firm in her decision, she is not willing to reconsider going to rehab/SNF for rehab.   Home health PT/OT/RN and social service visit is being arranged with case management to assess her home needs.  Wound care and LUE restrictions were discussed and written for the patient.  She was seen and examined on the day of discharge by Dr. Ladona Ridgel who felt she was stable for discharge.  Follow up wound check/lab draw (CBC) and office visit with Dr. Ladona Ridgel have been arranged.    Physical Exam: Filed Vitals:   07/21/15 1315 07/21/15 2112 07/22/15 0435 07/22/15 1056  BP: 137/53 122/85 154/70   Pulse: 86 82 87 130  Temp: 97.5 F (36.4 C) 97.8 F (36.6 C) 98.1 F (36.7 C)   TempSrc: Oral Oral Oral   Resp: Height:      Weight:      SpO2: 100% 99% 100%     GEN- The patient is elderly, well appearing, alert and oriented x 3 today.   HEENT: normocephalic, atraumatic; sclera clear, conjunctiva pink; hearing intact; oropharynx clear; neck supple, no JVP Lungs- Clear to ausculation bilaterally, normal work of breathing.  No wheezes, rales, rhonchi Heart- Regular rate and rhythm, 1/6 SM, no rubs or gallops, PMI not laterally displaced GI- soft, non-tender, non-distended, bowel sounds present, no hepatosplenomegaly Extremities- no clubbing, cyanosis, no edema remains, no tenderness to palpation b/l LE/feet MS- no significant deformity or atrophy Skin- warm and dry, no rash or lesion, extraction/implant sites are stable, + ecchymosis b/l chest, no hematomas, no drainage, no  erythema/edema Psych- euthymic mood, full affect Neuro- no gross deficits   Labs:   Lab Results  Component Value Date   WBC 8.2 07/20/2015   HGB 8.2* 07/20/2015   HCT 24.8* 07/20/2015   MCV 96.5 07/20/2015   PLT 197 07/20/2015     Recent Labs Lab 07/20/15 0329  NA 137  K 3.6  CL 104  CO2 24  BUN 22*  CREATININE 1.16*  CALCIUM 8.5*  GLUCOSE 91    Discharge Medications:    Medication List    TAKE these medications        aspirin 325 MG EC tablet  Take 325 mg by mouth every 6 (six) hours as needed for pain.     carvedilol 12.5 MG tablet  Commonly known as:  COREG  take 1 tablet by mouth twice a day     colchicine 0.6 MG tablet  take 1 tablet by mouth every 12 hours  Notes to Patient:  Continue to take this medicine as you were instructed by the original prescribing physician.     linezolid 600 MG tablet  Commonly known as:  ZYVOX  Take 1 tablet (600 mg total) by mouth every 12 (twelve) hours.  Notes to Patient:  To complete 12 days of treatment.     traMADol 50 MG tablet  Commonly known as:  ULTRAM  take  1 tablet by mouth four times a day if needed (MUST HAVE OFFICE VISIT FOR MORE REFILLS)        Disposition: Home with her son and HHC/RN/PT/OT Discharge Instructions    Diet - low sodium heart healthy    Complete by:  As directed      Increase activity slowly    Complete by:  As directed           Follow-up Information    Follow up with [facility] On 08/03/2015.   Specialty:  Cardiology   Why:  3:45PM lab draw and 4:00PM wound check    Contact information:   [address] [place] [address] [phone]      Follow up with Lewayne Bunting, MD On 10/12/2015.   Specialty:  Cardiology   Why:  11:45 AM   Contact information:   [address] [place] [address] [phone]       Duration of Discharge Encounter: Greater than 30 minutes including physician  time.  Norma Fredrickson, PA-C 07/22/2015 11:21 AM  EP Attending  Patient seen and examined. Her proline sutures have been removed. The patient and family have insisted on taking her home. We have recommended SNF. We will arrange followup.   Linard Daft,M.D.   ADDENDED to update discharge medications only. Francis Dowse, PA-C  [name].D.

## 2015-07-21 NOTE — Care Management Important Message (Signed)
Important Message  Patient Details  Name: Tiffany [name] MRN: [id number] Date of Birth: 1922/10/24   Medicare Important Message Given:  Yes    Kyla Balzarine 07/21/2015, 3:06 PM

## 2015-07-21 NOTE — Clinical Social Work Placement (Signed)
   CLINICAL SOCIAL WORK PLACEMENT  NOTE  Date:  07/21/2015  Patient Details  Name: ANYELIN HORWITZ MRN: [id number] Date of Birth: [birth year]  Clinical Social Work is seeking post-discharge placement for this patient at the Skilled  Nursing Facility level of care (*CSW will initial, date and re-position this form in  chart as items are completed):  Yes   Patient/family provided with [facility] Clinical Social Work Department's list of facilities offering this level of care within the geographic area requested by the patient (or if unable, by the patient's family).  Yes   Patient/family informed of their freedom to choose among providers that offer the needed level of care, that participate in Medicare, Medicaid or managed care program needed by the patient, have an available bed and are willing to accept the patient.  Yes   Patient/family informed of [facility]'s ownership interest in [facility] and [facility], as well as of the fact that they are under no obligation to receive care at these facilities.  PASRR submitted to EDS on       PASRR number received on       Existing PASRR number confirmed on       FL2 transmitted to all facilities in geographic area requested by pt/family on 07/21/15     FL2 transmitted to all facilities within larger geographic area on       Patient informed that his/her managed care company has contracts with or will negotiate with certain facilities, including the following:            Patient/family informed of bed offers received.  Patient chooses bed at       Physician recommends and patient chooses bed at      Patient to be transferred to   on  .  Patient to be transferred to facility by       Patient family notified on   of transfer.  Name of family member notified:        PHYSICIAN Please sign FL2     Additional Comment:    _______________________________________________ Orson Gear, Student-SW 07/21/2015, 10:24 AM

## 2015-07-22 ENCOUNTER — Telehealth: Payer: Self-pay | Admitting: *Deleted

## 2015-07-22 ENCOUNTER — Encounter (HOSPITAL_COMMUNITY): Payer: Self-pay

## 2015-07-22 ENCOUNTER — Other Ambulatory Visit: Payer: Self-pay | Admitting: Physician Assistant

## 2015-07-22 DIAGNOSIS — Z79899 Other long term (current) drug therapy: Secondary | ICD-10-CM

## 2015-07-22 DIAGNOSIS — T827XXD Infection and inflammatory reaction due to other cardiac and vascular devices, implants and grafts, subsequent encounter: Secondary | ICD-10-CM

## 2015-07-22 MED ORDER — TRAZODONE HCL 50 MG PO TABS
25.0000 mg | ORAL_TABLET | Freq: Once | ORAL | Status: AC
  Administered 2015-07-22: 25 mg via ORAL
  Filled 2015-07-22: qty 1

## 2015-07-22 MED ORDER — LINEZOLID 600 MG PO TABS: 600.0000 mg | ORAL_TABLET | Freq: Two times a day (BID) | ORAL | Status: AC

## 2015-07-22 NOTE — Progress Notes (Signed)
Discharge Note:  Patient alert and oriented X 3 and in no distress. Son at bedside.  Both given discharge instructions regarding wound care, medications, activity, upcoming appointments, and signs and symptoms to report.  Both verbalized understanding of discharge instructions. Telemetry discontinued.  Patient transported out via wheelchair by nurse tech.

## 2015-07-22 NOTE — Telephone Encounter (Signed)
Pt was on TCM list admitted for Pacemaker site Infection  Complete heart block. D/C 07/22/15 will f/u with cardiology 08/03/15. Per chart pt hasn't seen Dr. Jonny Ruiz since 2014.Marland KitchenRaechel Chute

## 2015-07-22 NOTE — Discharge Instructions (Signed)
° ° °  You need to have lab work done to follow your antibiotic management.  You are scheduled for lab visit on : 07/26/15 at  Please be sure to make this appointment.    Supplemental Discharge Instructions for  Pacemaker/Defibrillator Patients  Activity No heavy lifting or vigorous activity with your left arm for 6 to 8 weeks.  Do not raise your left arm above your head for one week.  Gradually raise your left arm as drawn below.             07/23/15                      07/24/15                       07/25/15                     07/26/15 __  NO DRIVING instructions: (patient does not drive).  WOUND CARE - Keep the wound area clean and dry.  Do not get this area until seen at wound check. No showers until cleared to at your wound check - The tape/steri-strips on your wound will fall off; do not pull them off.  No bandage is needed on the site.  DO  NOT apply any creams, oils, or ointments to the wound area. - If you notice any drainage or discharge from the wound, any swelling or bruising at the site, or you develop a fever > 101? F after you are discharged home, call the office at once.  Special Instructions - You are still able to use cellular telephones; use the ear opposite the side where you have your pacemaker/defibrillator.  Avoid carrying your cellular phone near your device. - When traveling through airports, show security personnel your identification card to avoid being screened in the metal detectors.  Ask the security personnel to use the hand wand. - Avoid arc welding equipment, MRI testing (magnetic resonance imaging), TENS units (transcutaneous nerve stimulators).  Call the office for questions about other devices. - Avoid electrical appliances that are in poor condition or are not properly grounded. - Microwave ovens are safe to be near or to operate.  Additional information for defibrillator patients should your device go off: - If your device goes off ONCE and you feel fine  afterward, notify the device clinic nurses. - If your device goes off ONCE and you do not feel well afterward, call 911. - If your device goes off TWICE, call 911. - If your device goes off THREE times in one day, call 911.  DO NOT DRIVE YOURSELF OR A FAMILY MEMBER WITH A DEFIBRILLATOR TO THE HOSPITAL--CALL 911.

## 2015-07-22 NOTE — Care Management Note (Signed)
Case Management Note Donn Pierini RN, BSN Unit 2W-Case Manager [phone]  Patient Details  Name: Tiffany [name] MRN: [id number] Date of Birth: 11-19-1922  Subjective/Objective:   Pt admitted with pacemaker infection                 Action/Plan: PTA pt lived at home- has son that is close by that checks in on her.- PT recommending STSNF- per CSW son plans to take pt home- does not want STSNF- would like HH - will f/u with son regarding plans for d/c SNF vs Home with [facility] - per rounding team feel like STSNF would be safest plan prior to returning home.   Expected Discharge Date:    07/22/15              Expected Discharge Plan:  Home w Home Health Services  In-House Referral:  Clinical Social Work  Discharge planning Services  CM Consult  Post Acute Care Choice:  Home Health Choice offered to:  Adult Children  DME Arranged:    DME Agency:     HH Arranged:  RN, PT, OT, Social Work, Patient Refused HH Agency:     Status of Service:  Completed, signed off  Medicare Important Message Given:  Yes Date Medicare IM Given:    Medicare IM give by:    Date Additional Medicare IM Given:    Additional Medicare Important Message give by:     If discussed at Long Length of Stay Meetings, dates discussed:    Discharge Disposition: 07/22/15  Additional Comments:  07/22/15- 1030- Nanci Lakatos RN, BSN- pt for d/c today- spoke with pt at bedside who states she is ready to return home- asked that CM speak with son regarding d/c arrangements- call made to pt's son [name] - to discuss HH arrangements- per son he states that he plans to move in with pt for awhile to assist- and "take care of her"- explained what HH services would do - and pt's son stated "I do not feel like she needs those services, I will be there to take care of her" tried again to explain the benefits of HH for pt- and again son politely declined any HH services- he does not feel like pt needs them also states that pt does not need  any DME. No referrals made per son's refusal of referral. Son states that he will be here to pick pt up in about an hour- bedside RN- aware.   Darrold Span, RN 07/22/2015, 11:16 AM

## 2015-07-22 NOTE — Progress Notes (Signed)
Physical Therapy Treatment Patient Details Name: Tiffany [name] MRN: [id number] DOB: 01/05/1923 Today's Date: 07/22/2015    History of Present Illness Patient is a 82 yo female admitted 07/11/15 with pacemaker lead/pocket infection, now s/p lead removal and placement of temp pacing wire.  (In setting of complete heart block)  Patient for new pacemaker placement 07/18/15.   PMH:  NICM, pacemaker/ICK, complete heart block, HTN, CHF, HOH    PT Comments    Pt performed increased gait distance with decreased assist.  Pt refusing SNF placement, will benefit from HHPT at d/c if remains to refuse SNF placement.  Pt will need RW at d/c.    Follow Up Recommendations  Home health PT;Supervision/Assistance - 24 hour;SNF (Pt will need supervision 24 hours and HHPT secondary to refusing SNF placement.  )     Equipment Recommendations  Rolling walker with 5" wheels (Pt refusing RW but needs for safety and stability in home.  )    Recommendations for Other Services       Precautions / Restrictions Precautions Precautions: Fall;ICD/Pacemaker Precaution Comments: Patient reports fall at home at mailbox, Pt educated on pacemaker precautions, pt does not understand or follow required max cues to correct.   Restrictions Weight Bearing Restrictions: No    Mobility  Bed Mobility Overal bed mobility: Needs Assistance (Pt received sitting edge of bed.  )                Transfers Overall transfer level: Needs assistance Equipment used: Rolling walker (2 wheeled) Transfers: Sit to/from Stand Sit to Stand: Min guard Stand pivot transfers: Min guard       General transfer comment: Pt performed sit to stand requiring cues to avoid pushing with R UE.    Ambulation/Gait Ambulation/Gait assistance: Min guard Ambulation Distance (Feet): 160 Feet Assistive device: Rolling walker (2 wheeled) Gait Pattern/deviations: Step-through pattern;Decreased stride length;Trunk flexed Gait velocity: decreased    General Gait Details: Pt required cues to increase stride length and gaze forward to improve head control.     Stairs            Wheelchair Mobility    Modified Rankin (Stroke Patients Only)       Balance     Sitting balance-Leahy Scale: Fair       Standing balance-Leahy Scale: Poor                      Cognition Arousal/Alertness: Awake/alert Behavior During Therapy: WFL for tasks assessed/performed Overall Cognitive Status: Within Functional Limits for tasks assessed Area of Impairment: Safety/judgement;Problem solving                    Exercises      General Comments        Pertinent Vitals/Pain Pain Assessment: No/denies pain    Home Living                      Prior Function            PT Goals (current goals can now be found in the care plan section) Acute Rehab PT Goals Patient Stated Goal: go home to her cat Potential to Achieve Goals: Good Progress towards PT goals: Progressing toward goals    Frequency  Min 3X/week    PT Plan Current plan remains appropriate;Discharge plan needs to be updated (d/c plan needs to be updated secondary to refusal of SNF.  )    Co-evaluation  End of Session Equipment Utilized During Treatment: Gait belt Activity Tolerance: Patient limited by pain Patient left: in chair;with call bell/phone within reach     Time: 1031-1052 PT Time Calculation (min) (ACUTE ONLY): 21 min  Charges:  $Gait Training: 8-22 mins                    G Codes:      Florestine Avers 07/23/2015, 11:07 AM  Joycelyn Rua, PTA pager [phone]

## 2015-07-23 LAB — TYPE AND SCREEN
ABO/RH(D): B POS
ANTIBODY SCREEN: NEGATIVE
UNIT DIVISION: 0
Unit division: 0

## 2015-07-24 ENCOUNTER — Telehealth: Payer: Self-pay | Admitting: Physician Assistant

## 2015-07-24 NOTE — Telephone Encounter (Signed)
Late entry:  Called 07/23/15 0900, regarding home ASA reported use/dose on home list of medicines.  Cautioned patient regarding high dose ASA use regularly, she reported that she infrequently used it historically, she reported minimal if any pain from her surgical sites and did not feel like she needed to use anything for pain relief.  She was advised to avoid use of ASA given post-operative she stated understanding, did not feel like she needed any pain management at all.  Stated she was feeling well, and instructed to call if she needed anything or had concerns.   Francis Dowse, PA-C

## 2015-07-26 ENCOUNTER — Other Ambulatory Visit: Payer: Self-pay

## 2015-07-26 MED ORDER — COLCHICINE 0.6 MG PO TABS: 0.6000 mg | ORAL_TABLET | Freq: Two times a day (BID) | ORAL | Status: DC

## 2015-07-27 ENCOUNTER — Emergency Department (HOSPITAL_COMMUNITY)
Admission: EM | Admit: 2015-07-27 | Discharge: 2015-07-27 | Disposition: A | Discharge status: Home / Self Care | Payer: Medicare Other | Attending: Emergency Medicine | Admitting: Emergency Medicine

## 2015-07-27 ENCOUNTER — Emergency Department (HOSPITAL_COMMUNITY): Payer: Medicare Other

## 2015-07-27 ENCOUNTER — Encounter (HOSPITAL_COMMUNITY): Payer: Self-pay | Admitting: *Deleted

## 2015-07-27 DIAGNOSIS — I10 Essential (primary) hypertension: Secondary | ICD-10-CM | POA: Diagnosis not present

## 2015-07-27 DIAGNOSIS — Z950 Presence of cardiac pacemaker: Secondary | ICD-10-CM | POA: Insufficient documentation

## 2015-07-27 DIAGNOSIS — I5020 Unspecified systolic (congestive) heart failure: Secondary | ICD-10-CM | POA: Insufficient documentation

## 2015-07-27 DIAGNOSIS — Z8639 Personal history of other endocrine, nutritional and metabolic disease: Secondary | ICD-10-CM | POA: Diagnosis not present

## 2015-07-27 DIAGNOSIS — F0390 Unspecified dementia without behavioral disturbance: Secondary | ICD-10-CM | POA: Diagnosis not present

## 2015-07-27 DIAGNOSIS — I97638 Postprocedural hematoma of a circulatory system organ or structure following other circulatory system procedure: Secondary | ICD-10-CM | POA: Insufficient documentation

## 2015-07-27 DIAGNOSIS — Z8669 Personal history of other diseases of the nervous system and sense organs: Secondary | ICD-10-CM | POA: Insufficient documentation

## 2015-07-27 DIAGNOSIS — M7981 Nontraumatic hematoma of soft tissue: Secondary | ICD-10-CM | POA: Diagnosis present

## 2015-07-27 DIAGNOSIS — Z7982 Long term (current) use of aspirin: Secondary | ICD-10-CM | POA: Insufficient documentation

## 2015-07-27 DIAGNOSIS — Y658 Other specified misadventures during surgical and medical care: Secondary | ICD-10-CM | POA: Insufficient documentation

## 2015-07-27 DIAGNOSIS — M1990 Unspecified osteoarthritis, unspecified site: Secondary | ICD-10-CM | POA: Diagnosis not present

## 2015-07-27 DIAGNOSIS — H9190 Unspecified hearing loss, unspecified ear: Secondary | ICD-10-CM | POA: Insufficient documentation

## 2015-07-27 DIAGNOSIS — S20211A Contusion of right front wall of thorax, initial encounter: Secondary | ICD-10-CM

## 2015-07-27 NOTE — ED Notes (Signed)
Pt remains monitored by blood pressure, pulse ox, and 5 lead. Pt is noted to have visitor at bedside.  

## 2015-07-27 NOTE — ED Provider Notes (Signed)
CSN: [id number]     Arrival date & time 07/27/15  1349 History   First MD Initiated Contact with Patient 07/27/15 1402     Chief Complaint  Patient presents with  . Bleeding/Bruising     (Consider location/radiation/quality/duration/timing/severity/associated sxs/prior Treatment) The history is provided by the patient and the EMS personnel.  Patient brought in for bleeding from her right anterior chest. Dark blood in nature. Brought in by EMS. Patient is a 82 year old female followed by Oliver Barre. Patient recently had pacemaker moved from this right chest site to her left chest. This was secondary due to a pocket infection. Patient still on antibiotics. Patient was admitted February 20 and was discharged March 3. Cardiologist involved was Dr. Johney Frame. Patient without any fevers currently patient just scared about the blood.  Past Medical History  Diagnosis Date  . Status post biventricular pacemaker     with AICD; 2004; revision 2007, explanted 07/11/15, Dr. Ladona Ridgel  . Complete heart block (HCC) 07/18/15    MDT PPM, Dr. Johney Frame  . Nonischemic cardiomyopathy (HCC)     EF 45-50% in 2009  . Systolic heart failure   . Macular degeneration   . Ventricular tachycardia (HCC)   . Arthritis   . Glaucoma   . Hyperlipidemia   . Essential hypertension, benign   . Osteoporosis   . Allergy   . Impaired glucose tolerance 02/02/2011  . Vitamin D deficiency 02/02/2011  . Hearing loss 02/02/2011  . Shortness of breath dyspnea     with exertion  . Hypothyroidism     not currently  . Depression     occasional- situational  . HOH (hard of hearing)    Past Surgical History  Procedure Laterality Date  . Bi-ventricular pacemaker revision (crt-r)  03-08-2014    MDT CRTP generator change with use of previously abandoned RV lead due to ICD RV lead failure  . Cholecystectomy    . Appendectomy    . Tonsillectomy    . Cataract surgery    . Bi-ventricular pacemaker revision N/A 03/08/2014    Procedure:  BI-VENTRICULAR PACEMAKER REVISION (CRT-R);  Surgeon: Marinus Maw, MD;  Location: [facility] CATH LAB;  Service: Cardiovascular;  Laterality: N/A;  . Bi-ventricular pacemaker revision N/A 06/16/2014    Procedure: BI-VENTRICULAR PACEMAKER REVISION (CRT-R);  Surgeon: Marinus Maw, MD;  Location: [facility] CATH LAB;  Service: Cardiovascular;  Laterality: N/A;  . Peripheral vascular catheterization N/A 07/14/2015    Procedure: Upper Extremity Venography / Left;  Surgeon: Will Jorja Loa, MD;  Location: [facility] INVASIVE CV LAB;  Service: Cardiovascular;  Laterality: N/A;  . Pacemaker lead removal N/A 07/11/2015    Procedure: PACEMAKER LEAD REMOVAL with placement of temporary pacing wire;  Surgeon: Marinus Maw, MD;  Location: [facility] OR;  Service: Cardiovascular;  Laterality: N/A;  . Complete heart block  07/18/15    MDT PPM, Dr. Johney Frame  . Ep implantable device N/A 07/18/2015    Procedure: Pacemaker Implant;  Surgeon: Hillis Range, MD;  Location: [facility] INVASIVE CV LAB;  Service: Cardiovascular;  Laterality: N/A;   Family History  Problem Relation Age of Onset  . Hypertension Mother   . Coronary artery disease Mother   . Arthritis Other   . Cancer Other     lung cancer  . Heart disease Other   . Hypertension Other    Social History  Substance Use Topics  . Smoking status: Never Smoker   . Smokeless tobacco: Never Used     Comment: smoked a little  when she was 22  . Alcohol Use: No   OB History    No data available     Review of Systems  Unable to perform ROS: Dementia      Allergies  Review of patient's allergies indicates no known allergies.  Home Medications   Prior to Admission medications   Medication Sig Start Date End Date Taking? Authorizing Provider  aspirin 325 MG EC tablet Take 325 mg by mouth every 6 (six) hours as needed for pain.    Yes Historical Provider, MD  carvedilol (COREG) 12.5 MG tablet take 1 tablet by mouth twice a day Patient taking differently: take 1 tablet by mouth once  daily at bedtime 03/16/15  Yes Marinus Maw, MD  colchicine 0.6 MG tablet Take 1 tablet (0.6 mg total) by mouth every 12 (twelve) hours. 07/26/15  Yes Corwin Levins, MD  linezolid (ZYVOX) 600 MG tablet Take 1 tablet (600 mg total) by mouth every 12 (twelve) hours. 07/22/15 08/02/15 Yes Renee Norberto Sorenson, PA-C  traMADol (ULTRAM) 50 MG tablet take 1 tablet by mouth four times a day if needed (MUST HAVE OFFICE VISIT FOR MORE REFILLS) 07/14/14  Yes Corwin Levins, MD   BP 144/81 mmHg  Pulse 72  Temp(Src) 98.2 F (36.8 C) (Oral)  Resp 14  SpO2 98% Physical Exam  Constitutional: She is oriented to person, place, and time. She appears well-developed and well-nourished. No distress.  HENT:  Head: Normocephalic and atraumatic.  Mouth/Throat: Oropharynx is clear and moist.  Eyes: Conjunctivae and EOM are normal. Pupils are equal, round, and reactive to light.  Neck: Normal range of motion. Neck supple.  Cardiovascular: Normal rate, regular rhythm and normal heart sounds.   No murmur heard. Pulmonary/Chest: Effort normal and breath sounds normal. No respiratory distress.  New pacemaker left chest area Steri-Strips in place no evidence of infection. Old pacemaker pocket right anterior chest. Incision there shows about a 1 cm opening with old blood. Most likely due to a hematoma that is liquefied. No evidence of infection. No active bleeding.  Abdominal: Soft. Bowel sounds are normal. There is no tenderness.  Musculoskeletal: Normal range of motion.  Neurological: She is alert and oriented to person, place, and time. No cranial nerve deficit. She exhibits normal muscle tone. Coordination normal.  Skin: Skin is warm.  Nursing note and vitals reviewed.   ED Course  Procedures (including critical care time) Labs Review Labs Reviewed - No data to display  Imaging Review Dg Chest Port 1 View  07/27/2015  CLINICAL DATA:  Chest wall bleeding EXAM: PORTABLE CHEST 1 VIEW COMPARISON:  07/19/2015 FINDINGS:  Cardiomegaly again noted. Dual lead cardiac pacemaker with leads in right atrium and right ventricle. No infiltrate or pulmonary edema. Mild left basilar atelectasis. Degenerative changes left shoulder. Atherosclerotic calcifications of thoracic aorta again noted. IMPRESSION: No active disease.  Dual lead cardiac pacemaker in place. Electronically Signed   By: Natasha Mead M.D.   On: 07/27/2015 15:31   I have personally reviewed and evaluated these images and lab results as part of my medical decision-making.   EKG Interpretation None      MDM   Final diagnoses:  Hematoma, chest wall, right, initial encounter    Patient status post pacemaker site infection old pacemaker was right side of the chest. Patient was admitted February 20 and discharged home on March 3. Patient still on antibiotics. Patient has a history of some dementia. Information obtained mostly from the charts. New pacemaker  was placed on the left side. That wound appears fine. Patient with some open incision to the old site probably there was a pocket hematoma liquefied and is now draining out. This is dark blood. No evidence of infection clinically or at the skin site. Patient without any further fevers. Chest x-ray is negative for any underlying lung problems. Cardiac monitor shows evidence of pacemaker functioning. Patient stable for discharge home and follow-up with cardiology. New dressing applied.   Vanetta Mulders, MD 07/27/15 1550

## 2015-07-27 NOTE — ED Notes (Signed)
MD at bedside. 

## 2015-07-27 NOTE — ED Notes (Signed)
Family at bedside. 

## 2015-07-27 NOTE — ED Notes (Addendum)
Pt presents via [facility] from home c/o pacemaker site bleeding.  Pt had PPM removed d/t infection (right chest) and replaced to L chest.  Pt was at home and noticed shirt was bloody.  EMS arrival shirt was soaked in blood, EMS reports blood but also "coffee grain" drainage. Site to left chest covered with gauze, bleeding controlled on arrival.  Pt alert, NAD.  BP-138/70 P-80 R-18 CBG-154.

## 2015-07-27 NOTE — Discharge Instructions (Signed)
Old blood coming from the old pacemaker pocket site. Hematoma that  has liquefied. follow-up with cardiology. Chest x-ray negative. No new active bleeding. This is old blood. Return for any new or worse symptoms.

## 2015-08-03 ENCOUNTER — Ambulatory Visit: Payer: Medicare Other

## 2015-08-03 ENCOUNTER — Other Ambulatory Visit: Payer: Medicare Other

## 2015-08-29 ENCOUNTER — Other Ambulatory Visit: Payer: Self-pay

## 2015-08-29 MED ORDER — CARVEDILOL 12.5 MG PO TABS: 12.5000 mg | ORAL_TABLET | Freq: Two times a day (BID) | ORAL | Status: AC

## 2015-09-05 ENCOUNTER — Telehealth: Payer: Self-pay

## 2015-09-05 NOTE — Telephone Encounter (Signed)
Please advise patient is requesting refill on tramadol  

## 2015-09-06 NOTE — Telephone Encounter (Signed)
Very sorry  Needs ROV for any controlled med refill as has not been seen in the office since dec 2014

## 2015-09-07 NOTE — Telephone Encounter (Signed)
Patient was just released from hospital and states that she can not come for office visit at this time, she understands that medication can not be refilled until seen.

## 2015-09-13 ENCOUNTER — Telehealth: Payer: Self-pay | Admitting: Internal Medicine

## 2015-09-13 NOTE — Telephone Encounter (Signed)
Received verbal OK from patient to speak with son Tiffany [name] called in as patient is having trouble breathing - wants to know if she needs to be put on oxygen Breathing difficulties occur all the time, not worse with sleep but does wake up gasping for breath per son Tiffany [name] thought initially was allergies but now thinks she needs home O2 - was giving her zyrtec which helped initially   Tiffany [name] states patient was initially able to come and feed her cat but now this puts her totally out of breath "It's all she can do to climb 3 steps" Tiffany [name] states she complains of trouble breathing at rest Symptoms have lasted for about 3-4 weeks  Son asked if someone can come to their home to evaluate for oxygen necessity  Son states it is very difficult for patient to get to MD office Son states he may not be able to bring patient to appt with Dr. Ladona Ridgel on 5/24  Son reports NO weight gain, NO swelling Son reports appetite is not as good as previous, but is still eating  Last pacer implant Feb 2017 Has not been to the office for a device check - son has a home monitor for her pacer but has not set it up  Son is aware that I will send the message to Dr. Ladona Ridgel and the device clinic to review and assistance with transmission

## 2015-09-13 NOTE — Telephone Encounter (Signed)
LMOVM for pt son to return my call.

## 2015-09-13 NOTE — Telephone Encounter (Signed)
New Message  Pt son called request a call back as soon as possible no further details//

## 2015-09-14 ENCOUNTER — Inpatient Hospital Stay (HOSPITAL_COMMUNITY)
Admission: EM | Admit: 2015-09-14 | Discharge: 2015-09-17 | DRG: 292 | Disposition: A | Discharge status: Home Health | Payer: Medicare Other | Attending: Internal Medicine | Admitting: Internal Medicine

## 2015-09-14 ENCOUNTER — Other Ambulatory Visit (HOSPITAL_COMMUNITY): Payer: Medicare Other

## 2015-09-14 ENCOUNTER — Emergency Department (HOSPITAL_COMMUNITY): Payer: Medicare Other

## 2015-09-14 ENCOUNTER — Encounter (HOSPITAL_COMMUNITY): Payer: Self-pay | Admitting: Adult Health

## 2015-09-14 DIAGNOSIS — Z66 Do not resuscitate: Secondary | ICD-10-CM | POA: Diagnosis present

## 2015-09-14 DIAGNOSIS — T827XXA Infection and inflammatory reaction due to other cardiac and vascular devices, implants and grafts, initial encounter: Secondary | ICD-10-CM | POA: Diagnosis present

## 2015-09-14 DIAGNOSIS — I5023 Acute on chronic systolic (congestive) heart failure: Secondary | ICD-10-CM | POA: Diagnosis present

## 2015-09-14 DIAGNOSIS — H3530 Unspecified macular degeneration: Secondary | ICD-10-CM | POA: Diagnosis present

## 2015-09-14 DIAGNOSIS — I442 Atrioventricular block, complete: Secondary | ICD-10-CM | POA: Diagnosis not present

## 2015-09-14 DIAGNOSIS — I10 Essential (primary) hypertension: Secondary | ICD-10-CM | POA: Diagnosis not present

## 2015-09-14 DIAGNOSIS — I110 Hypertensive heart disease with heart failure: Principal | ICD-10-CM | POA: Diagnosis present

## 2015-09-14 DIAGNOSIS — T827XXD Infection and inflammatory reaction due to other cardiac and vascular devices, implants and grafts, subsequent encounter: Secondary | ICD-10-CM

## 2015-09-14 DIAGNOSIS — H9190 Unspecified hearing loss, unspecified ear: Secondary | ICD-10-CM | POA: Diagnosis present

## 2015-09-14 DIAGNOSIS — E785 Hyperlipidemia, unspecified: Secondary | ICD-10-CM | POA: Diagnosis present

## 2015-09-14 DIAGNOSIS — E876 Hypokalemia: Secondary | ICD-10-CM | POA: Diagnosis present

## 2015-09-14 DIAGNOSIS — N179 Acute kidney failure, unspecified: Secondary | ICD-10-CM | POA: Diagnosis present

## 2015-09-14 DIAGNOSIS — R0902 Hypoxemia: Secondary | ICD-10-CM

## 2015-09-14 DIAGNOSIS — E039 Hypothyroidism, unspecified: Secondary | ICD-10-CM | POA: Diagnosis not present

## 2015-09-14 DIAGNOSIS — R0602 Shortness of breath: Secondary | ICD-10-CM | POA: Diagnosis not present

## 2015-09-14 DIAGNOSIS — I509 Heart failure, unspecified: Secondary | ICD-10-CM

## 2015-09-14 DIAGNOSIS — Z950 Presence of cardiac pacemaker: Secondary | ICD-10-CM

## 2015-09-14 DIAGNOSIS — Z95810 Presence of automatic (implantable) cardiac defibrillator: Secondary | ICD-10-CM

## 2015-09-14 LAB — BASIC METABOLIC PANEL
ANION GAP: 13 (ref 5–15)
BUN: 14 mg/dL (ref 6–20)
CO2: 20 mmol/L — AB (ref 22–32)
Calcium: 9.2 mg/dL (ref 8.9–10.3)
Chloride: 106 mmol/L (ref 101–111)
Creatinine, Ser: 0.98 mg/dL (ref 0.44–1.00)
GFR calc Af Amer: 57 mL/min — ABNORMAL LOW (ref >60)
GFR calc non Af Amer: 49 mL/min — ABNORMAL LOW (ref >60)
GLUCOSE: 113 mg/dL — AB (ref 65–99)
POTASSIUM: 4.1 mmol/L (ref 3.5–5.1)
Sodium: 139 mmol/L (ref 135–145)

## 2015-09-14 LAB — TROPONIN I
TROPONIN I: 0.05 ng/mL — AB (ref <0.031)
Troponin I: 0.05 ng/mL — ABNORMAL HIGH (ref <0.031)
Troponin I: 0.04 ng/mL — ABNORMAL HIGH (ref <0.031)

## 2015-09-14 LAB — PROTIME-INR
INR: 1.21 (ref 0.00–1.49)
Prothrombin Time: 15.4 seconds — ABNORMAL HIGH (ref 11.6–15.2)

## 2015-09-14 LAB — CBC
HEMATOCRIT: 31.7 % — AB (ref 36.0–46.0)
HEMOGLOBIN: 10 g/dL — AB (ref 12.0–15.0)
MCH: 32.1 pg (ref 26.0–34.0)
MCHC: 31.5 g/dL (ref 30.0–36.0)
MCV: 101.6 fL — ABNORMAL HIGH (ref 78.0–100.0)
Platelets: 196 10*3/uL (ref 150–400)
RBC: 3.12 MIL/uL — AB (ref 3.87–5.11)
RDW: 16.9 % — ABNORMAL HIGH (ref 11.5–15.5)
WBC: 9.9 10*3/uL (ref 4.0–10.5)

## 2015-09-14 LAB — LIPID PANEL
CHOL/HDL RATIO: 3 ratio
Cholesterol: 129 mg/dL (ref 0–200)
HDL: 43 mg/dL (ref >40)
LDL CALC: 68 mg/dL (ref 0–99)
TRIGLYCERIDES: 89 mg/dL (ref <150)
VLDL: 18 mg/dL (ref 0–40)

## 2015-09-14 LAB — I-STAT TROPONIN, ED: Troponin i, poc: 0.04 ng/mL (ref 0.00–0.08)

## 2015-09-14 LAB — TSH: TSH: 1.882 u[IU]/mL (ref 0.350–4.500)

## 2015-09-14 LAB — BRAIN NATRIURETIC PEPTIDE: B Natriuretic Peptide: 1512.3 pg/mL — ABNORMAL HIGH (ref 0.0–100.0)

## 2015-09-14 MED ORDER — SODIUM CHLORIDE 0.9% FLUSH
3.0000 mL | Freq: Two times a day (BID) | INTRAVENOUS | Status: DC
  Administered 2015-09-14 – 2015-09-17 (×7): 3 mL via INTRAVENOUS

## 2015-09-14 MED ORDER — SODIUM CHLORIDE 0.9 % IV SOLN: 250.0000 mL | INTRAVENOUS | Status: DC | PRN

## 2015-09-14 MED ORDER — SODIUM CHLORIDE 0.9% FLUSH: 3.0000 mL | INTRAVENOUS | Status: DC | PRN

## 2015-09-14 MED ORDER — LISINOPRIL 2.5 MG PO TABS
2.5000 mg | ORAL_TABLET | Freq: Every day | ORAL | Status: DC
  Administered 2015-09-14 – 2015-09-15 (×2): 2.5 mg via ORAL
  Filled 2015-09-14 (×2): qty 1

## 2015-09-14 MED ORDER — ONDANSETRON HCL 4 MG/2ML IJ SOLN: 4.0000 mg | Freq: Four times a day (QID) | INTRAMUSCULAR | Status: DC | PRN

## 2015-09-14 MED ORDER — ASPIRIN 81 MG PO CHEW
324.0000 mg | CHEWABLE_TABLET | Freq: Once | ORAL | Status: AC
  Administered 2015-09-14: 324 mg via ORAL
  Filled 2015-09-14: qty 4

## 2015-09-14 MED ORDER — FUROSEMIDE 10 MG/ML IJ SOLN
40.0000 mg | Freq: Once | INTRAMUSCULAR | Status: AC
  Administered 2015-09-14: 40 mg via INTRAVENOUS
  Filled 2015-09-14: qty 4

## 2015-09-14 MED ORDER — ASPIRIN EC 325 MG PO TBEC
325.0000 mg | DELAYED_RELEASE_TABLET | Freq: Every day | ORAL | Status: DC
  Administered 2015-09-15 – 2015-09-17 (×3): 325 mg via ORAL
  Filled 2015-09-14 (×4): qty 1

## 2015-09-14 MED ORDER — ENOXAPARIN SODIUM 40 MG/0.4ML ~~LOC~~ SOLN
40.0000 mg | SUBCUTANEOUS | Status: DC
  Administered 2015-09-14 – 2015-09-15 (×2): 40 mg via SUBCUTANEOUS
  Filled 2015-09-14 (×2): qty 0.4

## 2015-09-14 MED ORDER — CARVEDILOL 12.5 MG PO TABS
12.5000 mg | ORAL_TABLET | Freq: Two times a day (BID) | ORAL | Status: DC
  Administered 2015-09-14 – 2015-09-17 (×5): 12.5 mg via ORAL
  Filled 2015-09-14 (×7): qty 1

## 2015-09-14 MED ORDER — FUROSEMIDE 10 MG/ML IJ SOLN
40.0000 mg | Freq: Two times a day (BID) | INTRAMUSCULAR | Status: DC
  Administered 2015-09-14 – 2015-09-16 (×5): 40 mg via INTRAVENOUS
  Filled 2015-09-14 (×5): qty 4

## 2015-09-14 MED ORDER — ACETAMINOPHEN 325 MG PO TABS
650.0000 mg | ORAL_TABLET | ORAL | Status: DC | PRN
  Administered 2015-09-16: 650 mg via ORAL
  Filled 2015-09-14: qty 2

## 2015-09-14 NOTE — Consult Note (Signed)
CARDIOLOGY CONSULT NOTE   Patient ID: Tiffany [name] MRN: [id number] DOB/AGE: 1922/10/31 82 y.o.  Admit date: 09/14/2015  Primary Physician   Tiffany Barre, MD Primary Cardiologist: Dr. Ladona [name] Reason for Consultation: Chest pain, elevated troponin  HPI: Ms. Tiffany [name] is a 82 year old female with a past medical history nonischemic cardiomyopathy (last EF was 50-55% in Feb. 2017), complete heart block s/p MDT dual chamber PPM, and HTN.  No previous history of CAD.    She was recently admitted in Feb. 2017 after PPM erosion through the skin. She underwent antibiotic therapy and had new PPM placed.   She was experiencing increased SOB over the last 3-4 weeks.  She has noticed at home that she cannot climb more than 3 stairs without having to stop. Has had lower extremity edema that has worsened in the past 3-4 days.  She states that she lives alone, however chart says she lives with her son. He called EMS as she was having trouble breathing last night.  Her 02 saturations were in the 80's when EMS arrived.    She denies chest pain, palpitations and PND. Positive for orthopnea. Uses 2 pillows for sleep.  Reports that she eats a lot of canned foods and frozen meals. She does add salt to most of her food.    Troponin is elevated and flat at 0.05.  EKG is not concerning for ischemia.  Her BNP is elevated at 1512.  Chest x ray shows probable pulmonary edema.    Past Medical History  Diagnosis Date  . Status post biventricular pacemaker     with AICD; 2004; revision 2007, explanted 07/11/15, Dr. Ladona [name]  . Complete heart block (HCC) 07/18/15    MDT PPM, Dr. Johney [name]  . Nonischemic cardiomyopathy (HCC)     EF 45-50% in 2009  . Systolic heart failure   . Macular degeneration   . Ventricular tachycardia (HCC)   . Arthritis   . Glaucoma   . Hyperlipidemia   . Essential hypertension, benign   . Osteoporosis   . Allergy   . Impaired glucose tolerance 02/02/2011  . Vitamin D deficiency 02/02/2011  .  Hearing loss 02/02/2011  . Shortness of breath dyspnea     with exertion  . Hypothyroidism     not currently  . Depression     occasional- situational  . HOH (hard of hearing)      Past Surgical History  Procedure Laterality Date  . Bi-ventricular pacemaker revision (crt-r)  03-08-2014    MDT CRTP generator change with use of previously abandoned RV lead due to ICD RV lead failure  . Cholecystectomy    . Appendectomy    . Tonsillectomy    . Cataract surgery    . Bi-ventricular pacemaker revision N/A 03/08/2014    Procedure: BI-VENTRICULAR PACEMAKER REVISION (CRT-R);  Surgeon: Marinus Maw, MD;  Location: [facility] CATH LAB;  Service: Cardiovascular;  Laterality: N/A;  . Bi-ventricular pacemaker revision N/A 06/16/2014    Procedure: BI-VENTRICULAR PACEMAKER REVISION (CRT-R);  Surgeon: Marinus Maw, MD;  Location: [facility] CATH LAB;  Service: Cardiovascular;  Laterality: N/A;  . Peripheral vascular catheterization N/A 07/14/2015    Procedure: Upper Extremity Venography / Left;  Surgeon: Will Jorja Loa, MD;  Location: [facility] INVASIVE CV LAB;  Service: Cardiovascular;  Laterality: N/A;  . Pacemaker lead removal N/A 07/11/2015    Procedure: PACEMAKER LEAD REMOVAL with placement of temporary pacing wire;  Surgeon: Marinus Maw, MD;  Location: [facility] OR;  Service:  Cardiovascular;  Laterality: N/A;  . Complete heart block  07/18/15    MDT PPM, Dr. Johney [name]  . Ep implantable device N/A 07/18/2015    Procedure: Pacemaker Implant;  Surgeon: Hillis Range, MD;  Location: [facility] INVASIVE CV LAB;  Service: Cardiovascular;  Laterality: N/A;    No Known Allergies  I have reviewed the patient's current medications . [START ON 09/15/2015] aspirin EC  325 mg Oral Daily  . carvedilol  12.5 mg Oral BID WC  . enoxaparin (LOVENOX) injection  40 mg Subcutaneous Q24H  . furosemide  40 mg Intravenous BID  . lisinopril  2.5 mg Oral Daily  . sodium chloride flush  3 mL Intravenous Q12H     sodium chloride, acetaminophen,  ondansetron (ZOFRAN) IV, sodium chloride flush  Prior to Admission medications   Medication Sig Start Date End Date Taking? Authorizing Provider  aspirin 325 MG EC tablet Take 325 mg by mouth every 6 (six) hours as needed for pain.    Yes Historical Provider, MD  carvedilol (COREG) 12.5 MG tablet Take 1 tablet (12.5 mg total) by mouth 2 (two) times daily. 08/29/15  Yes Marinus Maw, MD  colchicine 0.6 MG tablet Take 1 tablet (0.6 mg total) by mouth every 12 (twelve) hours. Patient not taking: Reported on 09/14/2015 07/26/15   Corwin Levins, MD  traMADol Janean Sark) 50 MG tablet take 1 tablet by mouth four times a day if needed (MUST HAVE OFFICE VISIT FOR MORE REFILLS) Patient not taking: Reported on 09/14/2015 07/14/14   Corwin Levins, MD     Social History   Social History  . Marital Status: Widowed    Spouse Name: N/A  . Number of Children: N/A  . Years of Education: N/A   Occupational History  . retired    Social History Main Topics  . Smoking status: Never Smoker   . Smokeless tobacco: Never Used     Comment: smoked a little when she was 22  . Alcohol Use: No  . Drug Use: No  . Sexual Activity: No   Other Topics Concern  . Not on file   Social History Narrative    Family Status  Relation Status Death Age  . Other Alive    Family History  Problem Relation Age of Onset  . Hypertension Mother   . Coronary artery disease Mother   . Arthritis Other   . Cancer Other     lung cancer  . Heart disease Other   . Hypertension Other      ROS:  Full 14 point review of systems complete and found to be negative unless listed above.  Physical Exam: Blood pressure 131/64, pulse 93, temperature 98.3 F (36.8 C), temperature source Oral, resp. rate 18, height 5\' 4"  (1.626 m), weight 148 lb 6.4 oz (67.314 kg), SpO2 99 %.  General: Well developed, well nourished,eldery female in no acute distress Head: Eyes PERRLA, No xanthomas.  Normocephalic and atraumatic, oropharynx without edema  or exudate. Lungs: Crackles in bilateral lower lobes.   Heart: HRRR S1 S2, no rub/gallop, No murmur. Pulses are 2+ extrem.   Neck: No carotid bruits. No lymphadenopathy.  Abdomen: Bowel sounds present, abdomen slightly distended, soft and non-tender without masses or hernias noted. Msk:  No spine or cva tenderness. No weakness, no joint deformities or effusions. Extremities: No clubbing or cyanosis. +2 ankle edema.  Neuro: Alert and oriented X 3. No focal deficits noted. Psych:  Good affect, responds appropriately Skin: No rashes or lesions  noted.  Labs:   Lab Results  Component Value Date   WBC 9.9 09/14/2015   HGB 10.0* 09/14/2015   HCT 31.7* 09/14/2015   MCV 101.6* 09/14/2015   PLT 196 09/14/2015    Recent Labs  09/14/15 0405  INR 1.21    Recent Labs Lab 09/14/15 0037  NA 139  K 4.1  CL 106  CO2 20*  BUN 14  CREATININE 0.98  CALCIUM 9.2  GLUCOSE 113*    Recent Labs  09/14/15 0405  TROPONINI 0.05*    Recent Labs  09/14/15 0055  TROPIPOC 0.04   B NATRIURETIC PEPTIDE  Date/Time Value Ref Range Status  09/14/2015 03:01 AM 1512.3* 0.0 - 100.0 pg/mL Final   Lab Results  Component Value Date   CHOL 129 09/14/2015   HDL 43 09/14/2015   LDLCALC 68 09/14/2015   TRIG 89 09/14/2015    Echo: 07/18/15 - Left ventricle: The cavity size was normal. There was moderate  concentric hypertrophy. Systolic function was at the lower limits  of normal. The estimated ejection fraction was in the range of  50% to 55%. Possible hypokinesis of the apical myocardium (versus  pacing induced asynchrony). Inconsistent AV association limits  evaluation of LV diastolic function. Consider pacemaker  evaluation. - Ventricular septum: Septal motion showed abnormal function,  dyssynergy, and paradox. - Left atrium: The atrium was mildly dilated. - Right atrium: The atrium was mildly dilated. - Tricuspid valve: There was moderate regurgitation. - Pulmonary arteries:  Systolic pressure was moderately increased.  PA peak pressure: 54 mm Hg (S).  ECG: V paced.   Radiology:  Dg Chest 2 View  09/14/2015  CLINICAL DATA:  Acute onset of shortness of breath. Initial encounter. EXAM: CHEST  2 VIEW COMPARISON:  Chest radiograph performed 07/27/2015 FINDINGS: The lungs are well-aerated. Small bilateral pleural effusions are seen. Vascular congestion is noted. Increased interstitial markings raise concern for pulmonary edema. There is no evidence of pneumothorax. The heart is enlarged. A pacemaker is noted at the left chest wall, with leads ending at the right atrium and right ventricle. No acute osseous abnormalities are seen. Degenerative change is noted at the left glenohumeral joint. IMPRESSION: Vascular congestion and cardiomegaly. Increased interstitial markings raise concern for pulmonary edema. Small bilateral pleural effusions seen. Electronically Signed   By: Roanna Raider M.D.   On: 09/14/2015 01:37    ASSESSMENT AND PLAN:    Principal Problem:   Acute on chronic systolic (congestive) heart failure (HCC) Active Problems:   Essential hypertension, benign   Hyperlipidemia   Hypothyroidism   Complete heart block (HCC)   Pacemaker infection (HCC)   CHF exacerbation (HCC)  1. Acute on chronic systolic and diastolic CHF: She is very SOB with talking. She needs IV diuresis.  She has gotten one dose of 40mg  IV, she is only negative -460.  Her creatinine is stable at 0.98. We will initiate fluid restrictions and daily weights. Echo is pending.  We can increase her to 80mg  IV BID today, and reassess tomorrow.  Will follow renal function. She is on ACEI, newly started yesterday.  Can titrate up eventually.    2. HTN: Well controlled on beta blocker and ACEI.   3. HLD: LDL is 68, currently not on statin.     Signed: Little Ishikawa, NP 09/14/2015 10:26 AM Pager [phone]  Co-Sign MD

## 2015-09-14 NOTE — ED Provider Notes (Signed)
By signing my name below, I, Freida Busman, attest that this documentation has been prepared under the direction and in the presence of Kristen N Ward, DO . Electronically Signed: Freida Busman, Scribe. 09/14/2015. 1:49 AM.  TIME SEEN: 1:35 AM  CHIEF COMPLAINT:  Chief Complaint  Patient presents with  . Shortness of Breath     HPI:  HPI Comments:  Tiffany [name] is a 82 y.o. female  with a history of diastolic heart failure, and pacemaker For complete heart block who presents to the Emergency Department brought in by ambulance, complaining of SOB x ~ 2-3 weeks, worse today. Pt was taking zyrtec with moderate relief until today. She notes associated swelling in her BLE.  She denies CP, cough, fever, vomiting, and diarrhea. She also denies use of home O2, h/o asthma and COPD, and h/o PE/DVT. EMS reported pt's O2 Sat was in the 80s on RA upon their arrrival.   PCP: Oliver Barre Adolph Pollack)  Echo 07/18/2015: Study Conclusions  - Left ventricle: The cavity size was normal. There was moderate  concentric hypertrophy. Systolic function was at the lower limits  of normal. The estimated ejection fraction was in the range of  50% to 55%. Possible hypokinesis of the apical myocardium (versus  pacing induced asynchrony). Inconsistent AV association limits  evaluation of LV diastolic function. Consider pacemaker  evaluation. - Ventricular septum: Septal motion showed abnormal function,  dyssynergy, and paradox. - Left atrium: The atrium was mildly dilated. - Right atrium: The atrium was mildly dilated. - Tricuspid valve: There was moderate regurgitation. - Pulmonary arteries: Systolic pressure was moderately increased.  PA peak pressure: 54 mm Hg (S).  Impressions:  - Compared to 2014, there is substantial improvement in LV  function.   ROS: See HPI Constitutional: no fever  Eyes: no drainage  ENT: no runny nose   Cardiovascular:  no chest pain  Resp: no SOB  GI: no  vomiting GU: no dysuria Integumentary: no rash  Allergy: no hives  Musculoskeletal: no leg swelling  Neurological: no slurred speech ROS otherwise negative  PAST MEDICAL HISTORY/PAST SURGICAL HISTORY:  Past Medical History  Diagnosis Date  . Status post biventricular pacemaker     with AICD; 2004; revision 2007, explanted 07/11/15, Dr. Ladona Ridgel  . Complete heart block (HCC) 07/18/15    MDT PPM, Dr. Johney Frame  . Nonischemic cardiomyopathy (HCC)     EF 45-50% in 2009  . Systolic heart failure   . Macular degeneration   . Ventricular tachycardia (HCC)   . Arthritis   . Glaucoma   . Hyperlipidemia   . Essential hypertension, benign   . Osteoporosis   . Allergy   . Impaired glucose tolerance 02/02/2011  . Vitamin D deficiency 02/02/2011  . Hearing loss 02/02/2011  . Shortness of breath dyspnea     with exertion  . Hypothyroidism     not currently  . Depression     occasional- situational  . HOH (hard of hearing)     MEDICATIONS:  Prior to Admission medications   Medication Sig Start Date End Date Taking? Authorizing Provider  aspirin 325 MG EC tablet Take 325 mg by mouth every 6 (six) hours as needed for pain.    Yes Historical Provider, MD  carvedilol (COREG) 12.5 MG tablet Take 1 tablet (12.5 mg total) by mouth 2 (two) times daily. 08/29/15  Yes Marinus Maw, MD  colchicine 0.6 MG tablet Take 1 tablet (0.6 mg total) by mouth every 12 (twelve) hours.  07/26/15   Corwin Levins, MD  traMADol Janean Sark) 50 MG tablet take 1 tablet by mouth four times a day if needed (MUST HAVE OFFICE VISIT FOR MORE REFILLS) 07/14/14   Corwin Levins, MD    ALLERGIES:  No Known Allergies  SOCIAL HISTORY:  Social History  Substance Use Topics  . Smoking status: Never Smoker   . Smokeless tobacco: Never Used     Comment: smoked a little when she was 22  . Alcohol Use: No    FAMILY HISTORY: Family History  Problem Relation Age of Onset  . Hypertension Mother   . Coronary artery disease Mother   .  Arthritis Other   . Cancer Other     lung cancer  . Heart disease Other   . Hypertension Other     EXAM: BP 137/82 mmHg  Pulse 95  Temp(Src) 97.6 F (36.4 C) (Oral)  Resp 15  SpO2 100% CONSTITUTIONAL: Alert and oriented and responds appropriately to questions. Chronically ill appearing and elderly HEAD: Normocephalic EYES: Conjunctivae clear, PERRL ENT: normal nose; no rhinorrhea; moist mucous membranes NECK: Supple, no meningismus, no LAD  CARD: Regular rhythm and minimally tachy; S1 and S2 appreciated; no murmurs, no clicks, no rubs, no gallops RESP: Normal chest excursion without splinting; breath sounds equal bilaterally;Tachypneic; Hypoxia on RA; bibasilar crackles noted; no wheezes, respiratory distress, speaking full sentences ABD/GI: Normal bowel sounds; non-distended; soft, non-tender, no rebound, no guarding, no peritoneal signs BACK:  The back appears normal and is non-tender to palpation, there is no CVA tenderness EXT: Normal ROM in all joints; non-tender to palpation; bilateral lower extremity non-pitting edema; normal capillary refill; no cyanosis, no calf tenderness or swelling    SKIN: Normal color for age and race; warm; no rash NEURO: Moves all extremities equally, sensation to light touch intact diffusely, cranial nerves II through XII intact PSYCH: The patient's mood and manner are appropriate. Grooming and personal hygiene are appropriate.  MEDICAL DECISION MAKING: Patient here with what appears to be a CHF exacerbation. Was found in respiratory distress with EMS and sats in the 80s. Does not wear oxygen at home. Here she does have nonpitting bilateral lower extremity edema, bibasilar crackles. BNP is pending. Troponin negative. EKG shows a paced rhythm. Chest x-ray shows mild pulmonary edema. We'll give IV Lasix, admit.  ED PROGRESS:   1:55 AM  Discussed patient's case with hospitalist, Dr. Clyde Lundborg.  Recommend admission to telemetry, observation bed.  I will place  holding orders per their request. Patient and family (if present) updated with plan. Care transferred to hospitalist service.  I reviewed all nursing notes, vitals, pertinent old records, EKGs, labs, imaging (as available).    EKG Interpretation  Date/Time:  Wednesday September 14 2015 00:22:56 EDT Ventricular Rate:  102 PR Interval:  177 QRS Duration: 176 QT Interval:  424 QTC Calculation: 552 R Axis:   -60 Text Interpretation:  Atrial-sensed ventricular-paced rhythm No significant change since last tracing Confirmed by WARD,  DO, KRISTEN ([id number]) on 09/14/2015 1:01:10 AM       I personally performed the services described in this documentation, which was scribed in my presence. The recorded information has been reviewed and is accurate.    Layla Maw Ward, DO 09/14/15 0246

## 2015-09-14 NOTE — ED Notes (Addendum)
Presents with SOB began a day ago, worse with exertion, when EMS arrived pt was in the 80s with O2 sats, moving makes SOB much worse. Bilateral lower extremity edema-per pt that is normal for her. Lives at home with son. Pt denies pain. She is alert and oriented to person. Sats 100% on 2 liters, HR 100. Temp 97.6 RR 34, labored.

## 2015-09-14 NOTE — Research (Signed)
[email or web address]  Informed Consent   Subject Name: Tiffany [name]  Subject met inclusion and exclusion criteria.  The informed consent form, study requirements and expectations were reviewed with the subject and questions and concerns were addressed prior to the signing of the consent form.  The subject verbalized understanding of the trail requirements.  The subject agreed to participate in the [email or web address]  trial and signed the informed consent.  The informed consent was obtained prior to performance of any protocol-specific procedures for the subject.  A copy of the signed informed consent was given to the subject and a copy was placed in the subject's medical record.  Sandie Ano 09/14/2015, 17:14

## 2015-09-14 NOTE — Consult Note (Signed)
   [facility] Inpatient Consult   09/14/2015  Evaleigh Deese Staebell 04/18/1923 [id number]   Patient was evaluated for [facility] Care Management services for frequent admissions and with HX of HF, with a HX of a recent pacemaker infection. Patient lives at home alone with a son who is her contact person.  Came by to see the patient and she was resting, very hard of hearing, back in the bed after working with therapy before lunch, and asked if this writer could check back tomorrow.   Will follow up for post hospital follow up needs.    For questions, please contact: Charlesetta Shanks, RN BSN CCM [facility]  [phone] business mobile phone Toll free office [phone]

## 2015-09-14 NOTE — H&P (Addendum)
History and Physical    Tiffany [name] RSW:[id number] DOB: 04/27/1923 DOA: 09/14/2015  Referring MD/NP/PA:   PCP: Tiffany Barre, MD   Outpatient Specialists: Cardiology, Dr. Ladona [name]  Patient coming from:  Home  Chief Complaint: Shortness of breath and leg edema  HPI: Tiffany [name] is a 82 y.o. female with medical history significant of complete heart block, s/p of pacemaker placement, hypertension, hypothyroidism, gout, depression, systolic congestive heart failure, hard hearing, who presents with shortness of breath and leg edema.  Patient reports that she started having shortness of breath since yesterday, which has been progressively getting worse. She also has bilateral lower leg edema. Oxygen desaturation to 80% on room air at arrival. Patient does not have cough, chest pain, fever or chills. She denies nausea, vomiting, diarrhea, abdominal pain, symptoms of UTI or unilateral weakness. Of note, patient was recently hospitalized from 2/20-07/26/15 due to PM/ICD system infection. She had new Temp-Perm system placed by dr. Ladona [name] in that admission and completed 14 days of Abx treatment. Today, pt dose not have fever or chills.  ED Course: pt was found to have negative troponin, WBC 9.9, temperature normal, slight tachycardia, electrolytes and renal function okay. Chest x-rays consistent with pulmonary edema with small amount of bilateral pleural effusion. Pending BNP. Patient is admitted to inpatient for further evaluation treatment.  Review of Systems:   General: no fevers, chills, no changes in body weight, has fatigue HEENT: no blurry vision, hearing changes or sore throat Pulm: has dyspnea, no coughing, wheezing CV: no chest pain, no palpitations Abd: no nausea, vomiting, abdominal pain, diarrhea, constipation GU: no dysuria, burning on urination, increased urinary frequency, hematuria  Ext: has leg edema Neuro: no unilateral weakness, numbness, or tingling, no vision change or hearing  loss Skin: no rash MSK: No muscle spasm, no deformity, no limitation of [name] of movement in spin Heme: No easy bruising.  Travel history: No recent long distant travel.  Allergy: No Known Allergies  Past Medical History  Diagnosis Date  . Status post biventricular pacemaker     with AICD; 2004; revision 2007, explanted 07/11/15, Dr. Ladona [name]  . Complete heart block (HCC) 07/18/15    MDT PPM, Dr. Johney [name]  . Nonischemic cardiomyopathy (HCC)     EF 45-50% in 2009  . Systolic heart failure   . Macular degeneration   . Ventricular tachycardia (HCC)   . Arthritis   . Glaucoma   . Hyperlipidemia   . Essential hypertension, benign   . Osteoporosis   . Allergy   . Impaired glucose tolerance 02/02/2011  . Vitamin D deficiency 02/02/2011  . Hearing loss 02/02/2011  . Shortness of breath dyspnea     with exertion  . Hypothyroidism     not currently  . Depression     occasional- situational  . HOH (hard of hearing)     Past Surgical History  Procedure Laterality Date  . Bi-ventricular pacemaker revision (crt-r)  03-08-2014    MDT CRTP generator change with use of previously abandoned RV lead due to ICD RV lead failure  . Cholecystectomy    . Appendectomy    . Tonsillectomy    . Cataract surgery    . Bi-ventricular pacemaker revision N/A 03/08/2014    Procedure: BI-VENTRICULAR PACEMAKER REVISION (CRT-R);  Surgeon: Tiffany Maw, MD;  Location: [facility] CATH LAB;  Service: Cardiovascular;  Laterality: N/A;  . Bi-ventricular pacemaker revision N/A 06/16/2014    Procedure: BI-VENTRICULAR PACEMAKER REVISION (CRT-R);  Surgeon: Tiffany Maw, MD;  Location: [facility] CATH LAB;  Service: Cardiovascular;  Laterality: N/A;  . Peripheral vascular catheterization N/A 07/14/2015    Procedure: Upper Extremity Venography / Left;  Surgeon: Tiffany Jorja Loa, MD;  Location: [facility] INVASIVE CV LAB;  Service: Cardiovascular;  Laterality: N/A;  . Pacemaker lead removal N/A 07/11/2015    Procedure: PACEMAKER LEAD REMOVAL  with placement of temporary pacing wire;  Surgeon: Tiffany Maw, MD;  Location: [facility] OR;  Service: Cardiovascular;  Laterality: N/A;  . Complete heart block  07/18/15    MDT PPM, Dr. Johney [name]  . Ep implantable device N/A 07/18/2015    Procedure: Pacemaker Implant;  Surgeon: Hillis Range, MD;  Location: [facility] INVASIVE CV LAB;  Service: Cardiovascular;  Laterality: N/A;    Social History:  reports that she has never smoked. She has never used smokeless tobacco. She reports that she does not drink alcohol or use illicit drugs.  Family History:  Family History  Problem Relation Age of Onset  . Hypertension Mother   . Coronary artery disease Mother   . Arthritis Other   . Cancer Other     lung cancer  . Heart disease Other   . Hypertension Other      Prior to Admission medications   Medication Sig Start Date End Date Taking? Authorizing Provider  aspirin 325 MG EC tablet Take 325 mg by mouth every 6 (six) hours as needed for pain.    Yes Historical Provider, MD  carvedilol (COREG) 12.5 MG tablet Take 1 tablet (12.5 mg total) by mouth 2 (two) times daily. 08/29/15  Yes Tiffany Maw, MD  colchicine 0.6 MG tablet Take 1 tablet (0.6 mg total) by mouth every 12 (twelve) hours. Patient not taking: Reported on 09/14/2015 07/26/15   Corwin Levins, MD  traMADol (ULTRAM) 50 MG tablet take 1 tablet by mouth four times a day if needed (MUST HAVE OFFICE VISIT FOR MORE REFILLS) Patient not taking: Reported on 09/14/2015 07/14/14   Corwin Levins, MD    Physical Exam: Filed Vitals:   09/14/15 0021 09/14/15 0030 09/14/15 0200  BP:  137/82   Pulse: 100 95 103  Temp: 97.6 F (36.4 C)    TempSrc: Oral    Resp: 34 15 17  SpO2: 100% 100% 100%   General: Not in acute distress HEENT:       Eyes: PERRL, EOMI, no scleral icterus.       ENT: No discharge from the ears and nose, no pharynx injection, no tonsillar enlargement.        Neck: positive JVD, no bruit, no mass felt. Heme: No neck lymph node  enlargement. Cardiac: S1/S2, RRR, No murmurs, No gallops or rubs. Pulm: has rales at base bilaterally, wheezing, rhonchi or rubs. Abd: Soft, nondistended, nontender, no rebound pain, no organomegaly, BS present. GU: No hematuria Ext: 1+ pitting leg edema bilaterally. 2+DP/PT pulse bilaterally. Musculoskeletal: No joint deformities, No joint redness or warmth, no limitation of ROM in spin. Skin: No rashes.  Neuro: Alert, oriented X3, cranial nerves II-XII grossly intact, moves all extremities normally. Psych: Patient is not psychotic, no suicidal or hemocidal ideation.  Labs on Admission: I have personally reviewed following labs and imaging studies  CBC:  Recent Labs Lab 09/14/15 0037  WBC 9.9  HGB 10.0*  HCT 31.7*  MCV 101.6*  PLT 196   Basic Metabolic Panel:  Recent Labs Lab 09/14/15 0037  NA 139  K 4.1  CL 106  CO2 20*  GLUCOSE 113*  BUN  14  CREATININE 0.98  CALCIUM 9.2   GFR: CrCl cannot be calculated (Unknown ideal weight.). Liver Function Tests: No results for input(s): AST, ALT, ALKPHOS, BILITOT, PROT, ALBUMIN in the last 168 hours. No results for input(s): LIPASE, AMYLASE in the last 168 hours. No results for input(s): AMMONIA in the last 168 hours. Coagulation Profile: No results for input(s): INR, PROTIME in the last 168 hours. Cardiac Enzymes: No results for input(s): CKTOTAL, CKMB, CKMBINDEX, TROPONINI in the last 168 hours. BNP (last 3 results) No results for input(s): PROBNP in the last 8760 hours. HbA1C: No results for input(s): HGBA1C in the last 72 hours. CBG: No results for input(s): GLUCAP in the last 168 hours. Lipid Profile: No results for input(s): CHOL, HDL, LDLCALC, TRIG, CHOLHDL, LDLDIRECT in the last 72 hours. Thyroid Function Tests: No results for input(s): TSH, T4TOTAL, FREET4, T3FREE, THYROIDAB in the last 72 hours. Anemia Panel: No results for input(s): VITAMINB12, FOLATE, FERRITIN, TIBC, IRON, RETICCTPCT in the last 72  hours. Urine analysis:    Component Value Date/Time   COLORURINE YELLOW 03/27/2013 1049   APPEARANCEUR CLOUDY* 03/27/2013 1049   LABSPEC 1.020 03/27/2013 1049   PHURINE 5.0 03/27/2013 1049   GLUCOSEU NEGATIVE 03/27/2013 1049   HGBUR TRACE* 03/27/2013 1049   BILIRUBINUR NEGATIVE 03/27/2013 1049   KETONESUR NEGATIVE 03/27/2013 1049   PROTEINUR NEGATIVE 03/27/2013 1049   UROBILINOGEN 0.2 03/27/2013 1049   NITRITE NEGATIVE 03/27/2013 1049   LEUKOCYTESUR LARGE* 03/27/2013 1049   Sepsis Labs: (procalcitonin:4,lacticidven:4) )No results found for this or any previous visit (from the past 240 hour(s)).   Radiological Exams on Admission: Dg Chest 2 View  09/14/2015  CLINICAL DATA:  Acute onset of shortness of breath. Initial encounter. EXAM: CHEST  2 VIEW COMPARISON:  Chest radiograph performed 07/27/2015 FINDINGS: The lungs are well-aerated. Small bilateral pleural effusions are seen. Vascular congestion is noted. Increased interstitial markings raise concern for pulmonary edema. There is no evidence of pneumothorax. The heart is enlarged. A pacemaker is noted at the left chest wall, with leads ending at the right atrium and right ventricle. No acute osseous abnormalities are seen. Degenerative change is noted at the left glenohumeral joint. IMPRESSION: Vascular congestion and cardiomegaly. Increased interstitial markings raise concern for pulmonary edema. Small bilateral pleural effusions seen. Electronically Signed   By: Roanna Raider M.D.   On: 09/14/2015 01:37     EKG: Independently reviewed. QTC 552, paced rhythm  Assessment/Plan Principal Problem:   Acute on chronic systolic (congestive) heart failure (HCC) Active Problems:   Essential hypertension, benign   Hyperlipidemia   Hypothyroidism   Complete heart block (HCC)   Pacemaker infection (HCC)   CHF exacerbation (HCC)   Acute on chronic systolic (congestive) heart failure (HCC): Patient's SOB, leg edema, positive  DVD, and chest x-ray findings of pulmonary edema, are consistent with CHF exacerbation. Patient had EF 25-30 percent of 2014, which improved to 50-55 percent on the repeated 2-D echo on 07/18/15. No CP or cough on admission.  -Tiffany admit to tele bed for obs -Lasix 40 mg bid by IV -trop x 3 -2d echo -Risk factor stratification: A1c, FLP, TSH -Tiffany continue home coreg and ASA -start lisinopril 2.5 mg daily -Daily weights -strict I/O's -Low salt diet  HTN: -continue Coreg -IV Lasix -Newly started low-dose of lisinopril  HLD: Last LDL was 129 on 03/27/13. Not taking meds at home now. -f/u FLP  Hypothyroidism: Last TSH was 6.432 on 03/27/13. Used to be on Synthroid, which was discontinued. -Check  TSH  Hx of Complete heart block [facility]): s/p AICD and had pacemaker infection recently. Had new Temp-Perm system placed by dr. Ladona [name]. No acute issues today. -tele monitoring   DVT ppx: SQ Lovenox Code Status: DNR Family Communication: Yes, patient's sonat bed side Disposition Plan:  Anticipate discharge back to previous home environment Consults called:  none Admission status: Obs / tele   Date of Service 09/14/2015    Lorretta Harp [facility] Pager [phone]  If 7PM-7AM, please contact night-coverage [email or web address] Password [email or web address] 09/14/2015, 3:12 AM

## 2015-09-14 NOTE — ED Notes (Signed)
ekg given to dr ward.

## 2015-09-14 NOTE — Evaluation (Signed)
Physical Therapy Evaluation Patient Details Name: Tiffany [name] MRN: [id number] DOB: April 05, 1923 Today's Date: 09/14/2015   History of Present Illness  Patient is a 82 yo female admitted with CHF exacerbation.  PMH:  NICM, pacemaker/ICK, complete heart block, HTN, CHF, HOH  Clinical Impression  Pt admitted with above diagnosis. Fatigued with incr dyspnea after ambulating 40 ft. SaO2 98% on room air. Pt currently with functional limitations due to the deficits listed below (see PT Problem List).  Pt will benefit from skilled PT to increase their independence and safety with mobility to allow discharge to the venue listed below.  Patient lives alone with son assisting her for community needs (she does not drive).      Follow Up Recommendations Home health PT;Supervision - Intermittent    Equipment Recommendations  None recommended by PT (Continue to assess)    Recommendations for Other Services OT consult     Precautions / Restrictions Precautions Precautions: Fall Precaution Comments: denies falls Restrictions Weight Bearing Restrictions: No      Mobility  Bed Mobility Overal bed mobility: Modified Independent             General bed mobility comments: pt sitting at EOB on arrival  Transfers Overall transfer level: Needs assistance Equipment used: None Transfers: Sit to/from Stand Sit to Stand: Supervision         General transfer comment: x 2; denies dizziness, no imbalance  Ambulation/Gait Ambulation/Gait assistance: Min assist Ambulation Distance (Feet): 80 Feet Assistive device: 1 person hand held assist Gait Pattern/deviations: Step-through pattern;Decreased stride length (kyphotic)   Gait velocity interpretation: at or above normal speed for age/gender General Gait Details: initially ambulated with no support, then pt stated at 40 feet that she needed to turn around and immediately reported her legs felt too weak; she required min assist due to  unsteadiness; incr dyspnea, denied dizziness  Stairs            Wheelchair Mobility    Modified Rankin (Stroke Patients Only)       Balance Overall balance assessment: Needs assistance (too fatigued after walk for full assessment) Sitting-balance support: No upper extremity supported;Feet supported Sitting balance-Leahy Scale: Fair     Standing balance support: No upper extremity supported Standing balance-Leahy Scale: Fair                               Pertinent Vitals/Pain On 2L sitting EOB with SaO2 99%; removed O2 and conducted interview. As prepared for activity, SaO2 98% on room air at rest. Ambulated with dyspnea increasing to 3/4 and SaO2 remained 98%.   Pain Assessment: No/denies pain    Home Living Family/patient expects to be discharged to:: Private residence Living Arrangements: Alone Available Help at Discharge: Family;Available PRN/intermittently (son) Type of Home: House Home Access: Stairs to enter   [employer] of Steps: 1 Home Layout: Multi-level;Bed/bath upstairs Home Equipment: Shower seat Additional Comments: All information from pt    Prior Function Level of Independence: Needs assistance   Gait / Transfers Assistance Needed: Independent, denies any h/o falls  ADL's / Homemaking Assistance Needed: cooks simple meals or orders out; does not do housework; son brings [employer] Strategy [employer] Dominance   Dominant Hand: Right    Extremity/Trunk Assessment   Upper Extremity Assessment: Generalized weakness           Lower Extremity Assessment: Generalized weakness  Cervical / Trunk Assessment: Kyphotic  Communication   Communication: HOH  Cognition Arousal/Alertness: Awake/alert Behavior During Therapy: WFL for tasks assessed/performed Overall Cognitive Status: Within Functional Limits for tasks assessed                      General Comments      Exercises        Assessment/Plan     PT Assessment Patient needs continued PT services  PT Diagnosis Generalized weakness   PT Problem List Decreased strength;Decreased activity tolerance;Decreased balance;Decreased mobility;Decreased knowledge of use of DME;Cardiopulmonary status limiting activity  PT Treatment Interventions DME instruction;Gait training;Stair training;Functional mobility training;Therapeutic activities;Therapeutic exercise;Balance training;Patient/family education   PT Goals (Current goals can be found in the Care Plan section) Acute Rehab PT Goals Patient Stated Goal: return home PT Goal Formulation: With patient Time For Goal Achievement: 09/21/15 Potential to Achieve Goals: Good    Frequency Min 3X/week   Barriers to discharge Decreased caregiver support      Co-evaluation               End of Session Equipment Utilized During Treatment: Gait belt Activity Tolerance: Patient limited by fatigue Patient left: in chair;with call bell/phone within reach;with chair alarm set Nurse Communication: Mobility status (SaO2 OK on room air (98%))    Functional Assessment Tool Used: clinical judgement Functional Limitation: Mobility: Walking and moving around Mobility: Walking and Moving Around Current Status [phone]): At least 1 percent but less than 20 percent impaired, limited or restricted Mobility: Walking and Moving Around Goal Status [phone]): At least 1 percent but less than 20 percent impaired, limited or restricted    Time: 1108-1130 PT Time Calculation (min) (ACUTE ONLY): 22 min   Charges:   PT Evaluation $PT Eval Moderate Complexity: 1 Procedure     PT G Codes:   PT G-Codes **NOT FOR INPATIENT CLASS** Functional Assessment Tool Used: clinical judgement Functional Limitation: Mobility: Walking and moving around Mobility: Walking and Moving Around Current Status ([id number]): At least 1 percent but less than 20 percent impaired, limited or restricted Mobility: Walking and Moving Around Goal  Status [phone]): At least 1 percent but less than 20 percent impaired, limited or restricted    Florence Yeung 09/14/2015, 11:51 AM Pager [phone]

## 2015-09-14 NOTE — Progress Notes (Signed)
   09/14/15 0311  Vitals  Temp 98.3 F (36.8 C)  Temp Source Oral  BP 131/64 mmHg  BP Location Left Arm  Patient Position (if appropriate) Lying  Pulse Rate 93  Pulse Rate Source Dinamap  Resp 18  Oxygen Therapy  SpO2 99 %  O2 Device Nasal Cannula  O2 Flow Rate (L/min) 2 L/min  Height and Weight  Height 5\' 4"  (1.626 m)  Weight 67.314 kg (148 lb 6.4 oz)  Type of Scale Used Standing (Scale C)  Type of Weight Actual  BSA (Calculated - sq m) 1.74 sq meters  BMI (Calculated) 25.5  Weight in (lb) to have BMI = 25 145.3  Admitted pt to rm 3E19 from ED, pt alert and oriented, denied pain at this time, oriented to room, call bell paced within reach.

## 2015-09-14 NOTE — Progress Notes (Signed)
[facility]                                                                              Tiffany [name] Demographics  Tiffany [name], is a 82 y.o. female, DOB - 02-09-1923, ZOX:[id number]  Admit date - 09/14/2015   Admitting Physician Lorretta Harp, MD  Outpatient Primary MD for the Tiffany [name] is Oliver Barre, MD  Outpatient specialists: Cardiology, Dr. Ladona Ridgel  LOS -   days    Chief Complaint  Tiffany [name] presents with  . Shortness of Breath       Brief summary   Tiffany [name] is a 82 year old female with complete heart block, status post pacemaker placement, hypertension, hypothyroidism, gout, systolic CHF, hearing deficit presented with shortness of breath and leg edema, started day prior to admission, progressively getting worse. In the ED, O2 sats 80% on room air, otherwise denied any cough, chest pain or any fevers or chills. Tiffany [name] was hospitalized 2/22 3/7 due to pacemaker ICD system infection, had completed 14 days of antibiotics. In ED, troponin negative, labs within normal limits, chest x-ray consistent with pulmonary edema, BNP elevated at 1512. Tiffany [name] was admitted for further workup.   Assessment & Plan    Principal Problem: Acute on chronic systolic (congestive) heart failure [facility]): Tiffany [name] presented with peripheral edema, dyspnea, elevated BNP.  - Placed on IV diuresis, negative balance of 460 mL. Cardiology increased her Lasix to 80 mg twice a day -  Troponins 0.05, positive, no chest pain currently, consulted cardiology - Placed on strict I's and O's and daily weights - follow 2d echo, TSH 1.8   HTN: -continue Coreg, Started on ACE inhibitor, Lasix   HLD: Last LDL was 129 on 03/27/13. Not taking meds at home now.Lipid panel showed LDL 68, cholesterol 129   Hypothyroidism: Last TSH was 6.432 on 03/27/13. Used to be on Synthroid, which was discontinued. - TSH 1.8   Hx of Complete heart block (HCC): s/p AICD and had pacemaker infection recently, completed antibiotics -  ICD to being interrogated today.  Code Status: DO NOT RESUSCITATE   Family Communication: Discussed in detail with the Tiffany [name], all imaging results, lab results explained to the Tiffany [name] . Called Tiffany [name]'s son however was unable to reach him on phone.  Disposition Plan:   Time Spent in minutes    Procedures  None  Consults   cardiology  DVT Prophylaxis  Lovenox   Medications  Scheduled Meds: . [START ON 09/15/2015] aspirin EC  325 mg Oral Daily  . carvedilol  12.5 mg Oral BID WC  . enoxaparin (LOVENOX) injection  40 mg Subcutaneous Q24H  . furosemide  40 mg Intravenous BID  . lisinopril  2.5 mg Oral Daily  . sodium chloride flush  3 mL Intravenous Q12H   Continuous Infusions:  PRN Meds:.sodium chloride, acetaminophen, ondansetron (ZOFRAN) IV, sodium chloride flush   Antibiotics   Anti-infectives    None        Subjective:   Tiffany [name] was seen and examined today.  States that she feels all good and wants to go home ASAP. However she is short of breath while talking. Tiffany [name] denies dizziness, abdominal pain,  N/V/D/C, new weakness, numbess, tingling. No acute events overnight.    Objective:   Filed Vitals:   09/14/15 0021 09/14/15 0030 09/14/15 0200 09/14/15 0311  BP:  137/82  131/64  Pulse: 100 95 103 93  Temp: 97.6 F (36.4 C)   98.3 F (36.8 C)  TempSrc: Oral   Oral  Resp: 34 15 17 18   Height:    5\' 4"  (1.626 m)  Weight:    67.314 kg (148 lb 6.4 oz)  SpO2: 100% 100% 100% 99%    Intake/Output Summary (Last 24 hours) at 09/14/15 1150 Last data filed at 09/14/15 0741  Gross per 24 hour  Intake    240 ml  Output   1050 ml  Net   -810 ml     Wt Readings from Last 3 Encounters:  09/14/15 67.314 kg (148 lb 6.4 oz)  07/11/15 72.576 kg (160 lb)  06/30/15 72.576 kg (160 lb)     Exam  General: Alert and oriented x 3, NAD  HEENT:  PERRLA, EOMI, Anicteric Sclera, mucous membranes moist.   Neck: Supple, +JVD, no masses  CVS: S1 S2  auscultated, no rubs, murmurs or gallops. Regular rate and rhythm.  Respiratory: Bibasilar crackles  Abdomen: Soft, nontender, nondistended, + bowel sounds  Ext: no cyanosis clubbing, 2+ edema  Neuro: AAOx3, Cr N's II- XII. Strength 5/5 upper and lower extremities bilaterally  Skin: No rashes  Psych: Normal affect and demeanor, alert and oriented x3    Data Reviewed:  I have personally reviewed following labs and imaging studies  Micro Results No results found for this or any previous visit (from the past 240 hour(s)).  Radiology Reports Dg Chest 2 View  09/14/2015  CLINICAL DATA:  Acute onset of shortness of breath. Initial encounter. EXAM: CHEST  2 VIEW COMPARISON:  Chest radiograph performed 07/27/2015 FINDINGS: The lungs are well-aerated. Small bilateral pleural effusions are seen. Vascular congestion is noted. Increased interstitial markings raise concern for pulmonary edema. There is no evidence of pneumothorax. The heart is enlarged. A pacemaker is noted at the left chest wall, with leads ending at the right atrium and right ventricle. No acute osseous abnormalities are seen. Degenerative change is noted at the left glenohumeral joint. IMPRESSION: Vascular congestion and cardiomegaly. Increased interstitial markings raise concern for pulmonary edema. Small bilateral pleural effusions seen. Electronically Signed   By: Roanna Raider M.D.   On: 09/14/2015 01:37    CBC  Recent Labs Lab 09/14/15 0037  WBC 9.9  HGB 10.0*  HCT 31.7*  PLT 196  MCV 101.6*  MCH 32.1  MCHC 31.5  RDW 16.9*    Chemistries   Recent Labs Lab 09/14/15 0037  NA 139  K 4.1  CL 106  CO2 20*  GLUCOSE 113*  BUN 14  CREATININE 0.98  CALCIUM 9.2   ------------------------------------------------------------------------------------------------------------------ estimated creatinine clearance is 35.2 mL/min (by C-G formula based on Cr of  0.98). ------------------------------------------------------------------------------------------------------------------ No results for input(s): HGBA1C in the last 72 hours. ------------------------------------------------------------------------------------------------------------------  Recent Labs  09/14/15 0405  CHOL 129  HDL 43  LDLCALC 68  TRIG 89  CHOLHDL 3.0   ------------------------------------------------------------------------------------------------------------------  Recent Labs  09/14/15 0301  TSH 1.882   ------------------------------------------------------------------------------------------------------------------ No results for input(s): VITAMINB12, FOLATE, FERRITIN, TIBC, IRON, RETICCTPCT in the last 72 hours.  Coagulation profile  Recent Labs Lab 09/14/15 0405  INR 1.21    No results for input(s): DDIMER in the last 72 hours.  Cardiac Enzymes  Recent Labs Lab 09/14/15 0405 09/14/15  3086  TROPONINI 0.05* 0.05*   ------------------------------------------------------------------------------------------------------------------ Invalid input(s): POCBNP  No results for input(s): GLUCAP in the last 72 hours.   RAI,RIPUDEEP M.D. [facility] 09/14/2015, 11:50 AM  Pager: [phone] Between 7am to 7pm - call Pager - [phone]  After 7pm go to [email or web address] - password [email or web address]  Call night coverage person covering after 7pm

## 2015-09-14 NOTE — Progress Notes (Signed)
Pt pulled out IV unable to give scheduled IV medication. Will attempt access.

## 2015-09-15 ENCOUNTER — Inpatient Hospital Stay (HOSPITAL_COMMUNITY): Payer: Medicare Other

## 2015-09-15 DIAGNOSIS — I5023 Acute on chronic systolic (congestive) heart failure: Secondary | ICD-10-CM | POA: Diagnosis not present

## 2015-09-15 DIAGNOSIS — I509 Heart failure, unspecified: Secondary | ICD-10-CM | POA: Diagnosis not present

## 2015-09-15 DIAGNOSIS — H9190 Unspecified hearing loss, unspecified ear: Secondary | ICD-10-CM | POA: Diagnosis present

## 2015-09-15 DIAGNOSIS — Z95810 Presence of automatic (implantable) cardiac defibrillator: Secondary | ICD-10-CM | POA: Diagnosis not present

## 2015-09-15 DIAGNOSIS — Z66 Do not resuscitate: Secondary | ICD-10-CM | POA: Diagnosis present

## 2015-09-15 DIAGNOSIS — E876 Hypokalemia: Secondary | ICD-10-CM

## 2015-09-15 DIAGNOSIS — I110 Hypertensive heart disease with heart failure: Secondary | ICD-10-CM | POA: Diagnosis present

## 2015-09-15 DIAGNOSIS — N179 Acute kidney failure, unspecified: Secondary | ICD-10-CM | POA: Diagnosis not present

## 2015-09-15 DIAGNOSIS — I442 Atrioventricular block, complete: Secondary | ICD-10-CM | POA: Diagnosis not present

## 2015-09-15 DIAGNOSIS — H3530 Unspecified macular degeneration: Secondary | ICD-10-CM | POA: Diagnosis present

## 2015-09-15 DIAGNOSIS — R0602 Shortness of breath: Secondary | ICD-10-CM | POA: Diagnosis present

## 2015-09-15 DIAGNOSIS — E785 Hyperlipidemia, unspecified: Secondary | ICD-10-CM | POA: Diagnosis not present

## 2015-09-15 DIAGNOSIS — I10 Essential (primary) hypertension: Secondary | ICD-10-CM | POA: Diagnosis not present

## 2015-09-15 DIAGNOSIS — Z950 Presence of cardiac pacemaker: Secondary | ICD-10-CM | POA: Diagnosis not present

## 2015-09-15 DIAGNOSIS — E039 Hypothyroidism, unspecified: Secondary | ICD-10-CM | POA: Diagnosis present

## 2015-09-15 LAB — BASIC METABOLIC PANEL
Anion gap: 12 (ref 5–15)
BUN: 13 mg/dL (ref 6–20)
CHLORIDE: 99 mmol/L — AB (ref 101–111)
CO2: 30 mmol/L (ref 22–32)
Calcium: 8.8 mg/dL — ABNORMAL LOW (ref 8.9–10.3)
Creatinine, Ser: 1.16 mg/dL — ABNORMAL HIGH (ref 0.44–1.00)
GFR calc Af Amer: 46 mL/min — ABNORMAL LOW (ref >60)
GFR calc non Af Amer: 40 mL/min — ABNORMAL LOW (ref >60)
Glucose, Bld: 116 mg/dL — ABNORMAL HIGH (ref 65–99)
POTASSIUM: 3.2 mmol/L — AB (ref 3.5–5.1)
SODIUM: 141 mmol/L (ref 135–145)

## 2015-09-15 LAB — ECHOCARDIOGRAM COMPLETE
Height: 64 in
Weight: 2283.2 oz

## 2015-09-15 LAB — HEMOGLOBIN A1C
Hgb A1c MFr Bld: 5.5 % (ref 4.8–5.6)
Mean Plasma Glucose: 111 mg/dL

## 2015-09-15 MED ORDER — POTASSIUM CHLORIDE CRYS ER 20 MEQ PO TBCR
40.0000 meq | EXTENDED_RELEASE_TABLET | Freq: Once | ORAL | Status: AC
  Administered 2015-09-15: 40 meq via ORAL
  Filled 2015-09-15: qty 2

## 2015-09-15 MED ORDER — ENOXAPARIN SODIUM 30 MG/0.3ML ~~LOC~~ SOLN
30.0000 mg | SUBCUTANEOUS | Status: DC
  Administered 2015-09-16 – 2015-09-17 (×2): 30 mg via SUBCUTANEOUS
  Filled 2015-09-15 (×2): qty 0.3

## 2015-09-15 NOTE — Telephone Encounter (Signed)
Per Epic notes, patient currently admitted to [facility] for CHF exacerbation.

## 2015-09-15 NOTE — Progress Notes (Signed)
PT Cancellation Note  Patient Details Name: Tiffany [name] MRN: [id number] DOB: 11/26/1922   Cancelled Treatment:    Reason Eval/Treat Not Completed: Fatigue/lethargy limiting ability to participate;Medical issues which prohibited therapy   Noted potassium 3.2 (not yet receiving replacement), BP 94/39, and pt lethargic. Will attempt to see later today as appropriate and as schedule permits   Joycie Aerts 09/15/2015, 9:26 AM  Pager [phone]

## 2015-09-15 NOTE — Progress Notes (Signed)
OT Cancellation Note  Patient Details Name: MANDRA ALDERETTE MRN: [id number] DOB: 12-09-1922   Cancelled Treatment:    Reason Eval/Treat Not Completed: Fatigue/lethargy limiting ability to participate;Medical issues which prohibited therapy. Pt arousable but lethargic upon therapist's arrival requesting OT to come back later due to tiredness. Vitals assessed in supine: bp 94/39, pulse 72, O2 94 on 2L via [place]. Nursing notified. Will reattempt as schedule permits.    Pilar Grammes 09/15/2015, 9:11 AM

## 2015-09-15 NOTE — Progress Notes (Signed)
Heart Failure Navigator Consult Note  Presentation: Tiffany [name] is a 82 y.o. female with medical history significant of complete heart block, s/p of pacemaker placement, hypertension, hypothyroidism, gout, depression, systolic congestive heart failure, hard hearing, who presents with shortness of breath and leg edema.  Patient reports that she started having shortness of breath since yesterday, which has been progressively getting worse. She also has bilateral lower leg edema. Oxygen desaturation to 80% on room air at arrival. Patient does not have cough, chest pain, fever or chills. She denies nausea, vomiting, diarrhea, abdominal pain, symptoms of UTI or unilateral weakness. Of note, patient was recently hospitalized from 2/20-07/26/15 due to PM/ICD system infection. She had new Temp-Perm system placed by dr. Ladona Ridgel in that admission and completed 14 days of Abx treatment. Today, pt dose not have fever or chills.   Past Medical History  Diagnosis Date  . Status post biventricular pacemaker     with AICD; 2004; revision 2007, explanted 07/11/15, Dr. Ladona Ridgel  . Complete heart block (HCC) 07/18/15    MDT PPM, Dr. Johney Frame  . Nonischemic cardiomyopathy (HCC)     EF 45-50% in 2009  . Systolic heart failure   . Macular degeneration   . Ventricular tachycardia (HCC)   . Arthritis   . Glaucoma   . Hyperlipidemia   . Essential hypertension, benign   . Osteoporosis   . Allergy   . Impaired glucose tolerance 02/02/2011  . Vitamin D deficiency 02/02/2011  . Hearing loss 02/02/2011  . Shortness of breath dyspnea     with exertion  . Hypothyroidism     not currently  . Depression     occasional- situational  . HOH (hard of hearing)     Social History   Social History  . Marital Status: Widowed    Spouse Name: Tiffany [name]  . Number of Children: Tiffany [name]  . Years of Education: Tiffany [name]   Occupational History  . retired    Social History Main Topics  . Smoking status: Never Smoker   . Smokeless tobacco:  Never Used     Comment: smoked a little when she was 22  . Alcohol Use: No  . Drug Use: No  . Sexual Activity: No   Other Topics Concern  . None   Social History Narrative    ECHO--07/18/15 Study Conclusions  - Left ventricle: The cavity size was normal. There was moderate  concentric hypertrophy. Systolic function was at the lower limits  of normal. The estimated ejection fraction was in the range of  50% to 55%. Possible hypokinesis of the apical myocardium (versus  pacing induced asynchrony). Inconsistent AV association limits  evaluation of LV diastolic function. Consider pacemaker  evaluation. - Ventricular septum: Septal motion showed abnormal function,  dyssynergy, and paradox. - Left atrium: The atrium was mildly dilated. - Right atrium: The atrium was mildly dilated. - Tricuspid valve: There was moderate regurgitation. - Pulmonary arteries: Systolic pressure was moderately increased.  PA peak pressure: 54 mm Hg (S).  Impressions:  - Compared to 2014, there is substantial improvement in LV  function.  Transthoracic echocardiography. M-mode, complete 2D, spectral Doppler, and color Doppler. Birthdate: Patient birthdate: 21-Jan-1923. Age: Patient is 82 yr old. Sex: Gender: female. BMI: 25.8 kg/m^2. Blood pressure: 176/67 Patient status: Inpatient. Study date: Study date: 07/18/2015. Study time: 08:22 AM. Location: Bedside.  -------------------------------------------------------------------  BNP    Component Value Date/Time   BNP 1512.3* 09/14/2015 0301    ProBNP No results found for: PROBNP  Education Assessment and Provision:  Detailed education and instructions provided on heart failure disease management including the following:  Signs and symptoms of Heart Failure When to call the physician Importance of daily weights Low sodium diet Fluid restriction Medication management Anticipated future follow-up  appointments  Patient education given on each of the above topics.  Patient acknowledges understanding and acceptance of all instructions.  I spoke briefly with Tiffany [name] regarding her HF.  She tells me that she does weigh daily however no on has ever told her to contact physician if her weight was elevated.  I reviewed the relation of weight increases to HF and when to contact the physician.  She tells me that she does not particularly restrict sodium at all.  I reviewed a low sodium diet and high sodium foods to avoid.  She denies any issues with getting or taking prescribed medications.  She lives alone yet has a son that visits frequently.  She will follow with [facility] after discharge.  Education Materials:  "Living Better With Heart Failure" Booklet, Daily Weight Tracker Tool    High Risk Criteria for Readmission and/or Poor Patient Outcomes:   EF <30%- No 50-55%--new pending echo  2 or more admissions in 6 months-  Difficult social situation- No  Demonstrates medication noncompliance- No    Barriers of Care:  Knowledge and compliance  Discharge Planning:  Plans to return to home alone? With son assisting as needed.   She will benefit from [facility] for ongoing education, compliance reinforcement, and symptom recognition.  :

## 2015-09-15 NOTE — Progress Notes (Signed)
[facility]                                                                              Patient Demographics  Tiffany [name], is a 82 y.o. female, DOB - 01-19-1923, WHQ:[id number]  Admit date - 09/14/2015   Admitting Physician Lorretta Harp, MD  Outpatient Primary MD for the patient is Oliver Barre, MD  Outpatient specialists: Cardiology, Dr. Ladona Ridgel  LOS - 0  days    Chief Complaint  Patient presents with  . Shortness of Breath       Brief summary   Patient is a 82 year old female with complete heart block, status post pacemaker placement, hypertension, hypothyroidism, gout, systolic CHF, hearing deficit presented with shortness of breath and leg edema, started day prior to admission, progressively getting worse. In the ED, O2 sats 80% on room air, otherwise denied any cough, chest pain or any fevers or chills. Patient was hospitalized 2/22 3/7 due to pacemaker ICD system infection, had completed 14 days of antibiotics. In ED, troponin negative, labs within normal limits, chest x-ray consistent with pulmonary edema, BNP elevated at 1512. Patient was admitted for further workup.   Assessment & Plan    Principal Problem: Acute on chronic systolic (congestive) heart failure [facility]): Patient presented with peripheral edema, dyspnea, elevated BNP.  - Placed on IV diuresis, negative balance of 1.1 L .  - Continue IV Lasix, cardiology recommending transitioning to oral Lasix and a - ACE inhibitor discontinued due to creatinine trending up  HTN: -continue Coreg, Lasix   HLD: Last LDL was 129 on 03/27/13.  - Not taking meds at home now.Lipid panel showed LDL 68, cholesterol 129   Hypothyroidism: Last TSH was 6.432 on 03/27/13. Used to be on Synthroid, which was discontinued. - TSH 1.8   Hx of Complete heart block (HCC): s/p AICD and had pacemaker infection recently, completed antibiotics - ICD interrogated  Hypokalemia Replace  Code Status: DO NOT RESUSCITATE   Family  Communication: Discussed in detail with the patient, all imaging results, lab results explained to the patient . Called patient's son however was unable to reach him on phone.  Disposition Plan:   Time Spent in minutes    Procedures  None   Consults   cardiology  DVT Prophylaxis  Lovenox   Medications  Scheduled Meds: . aspirin EC  325 mg Oral Daily  . carvedilol  12.5 mg Oral BID WC  . enoxaparin (LOVENOX) injection  40 mg Subcutaneous Q24H  . furosemide  40 mg Intravenous BID  . sodium chloride flush  3 mL Intravenous Q12H   Continuous Infusions:  PRN Meds:.sodium chloride, acetaminophen, ondansetron (ZOFRAN) IV, sodium chloride flush   Antibiotics   Anti-infectives    None        Subjective:   Tiffany [name] was seen and examined today.Denies any specific complaints this morning,  no chest pain or dizziness.  Patient denies dizziness, abdominal pain, N/V/D/C, new weakness, numbess, tingling. No acute events overnight.    Objective:   Filed Vitals:   09/15/15 0103 09/15/15 0551 09/15/15 0910 09/15/15 1053  BP: 105/46 110/45 94/39 103/52  Pulse: 75  79 72 73  Temp: 97.3 F (36.3 C) 98.2 F (36.8 C)    TempSrc: Oral Oral    Resp: 18 18    Height:      Weight:  64.728 kg (142 lb 11.2 oz)    SpO2: 99% 98% 94%     Intake/Output Summary (Last 24 hours) at 09/15/15 1109 Last data filed at 09/15/15 0630  Gross per 24 hour  Intake    580 ml  Output    800 ml  Net   -220 ml     Wt Readings from Last 3 Encounters:  09/15/15 64.728 kg (142 lb 11.2 oz)  07/11/15 72.576 kg (160 lb)  06/30/15 72.576 kg (160 lb)     Exam  General: Alert and oriented x 3, NAD  HEENT:    Neck: Supple, +JVD, no masses  CVS: S1 S2 auscultated, no rubs, murmurs or gallops. Regular rate and rhythm.  Respiratory: Bibasilar crackles  Abdomen: Soft, nontender, nondistended, + bowel sounds  Ext: no cyanosis clubbing, trace edema  Neuro:  no new complaints  Skin:  No rashes  Psych: Normal affect and demeanor, alert and oriented    Data Reviewed:  I have personally reviewed following labs and imaging studies  Micro Results No results found for this or any previous visit (from the past 240 hour(s)).  Radiology Reports Dg Chest 2 View  09/14/2015  CLINICAL DATA:  Acute onset of shortness of breath. Initial encounter. EXAM: CHEST  2 VIEW COMPARISON:  Chest radiograph performed 07/27/2015 FINDINGS: The lungs are well-aerated. Small bilateral pleural effusions are seen. Vascular congestion is noted. Increased interstitial markings raise concern for pulmonary edema. There is no evidence of pneumothorax. The heart is enlarged. A pacemaker is noted at the left chest wall, with leads ending at the right atrium and right ventricle. No acute osseous abnormalities are seen. Degenerative change is noted at the left glenohumeral joint. IMPRESSION: Vascular congestion and cardiomegaly. Increased interstitial markings raise concern for pulmonary edema. Small bilateral pleural effusions seen. Electronically Signed   By: Roanna Raider M.D.   On: 09/14/2015 01:37    CBC  Recent Labs Lab 09/14/15 0037  WBC 9.9  HGB 10.0*  HCT 31.7*  PLT 196  MCV 101.6*  MCH 32.1  MCHC 31.5  RDW 16.9*    Chemistries   Recent Labs Lab 09/14/15 0037 09/15/15 0256  NA 139 141  K 4.1 3.2*  CL 106 99*  CO2 20* 30  GLUCOSE 113* 116*  BUN 14 13  CREATININE 0.98 1.16*  CALCIUM 9.2 8.8*   ------------------------------------------------------------------------------------------------------------------ estimated creatinine clearance is 27.3 mL/min (by C-G formula based on Cr of 1.16). ------------------------------------------------------------------------------------------------------------------  Recent Labs  09/14/15 0405  HGBA1C 5.5   ------------------------------------------------------------------------------------------------------------------  Recent Labs   09/14/15 0405  CHOL 129  HDL 43  LDLCALC 68  TRIG 89  CHOLHDL 3.0   ------------------------------------------------------------------------------------------------------------------  Recent Labs  09/14/15 0301  TSH 1.882   ------------------------------------------------------------------------------------------------------------------ No results for input(s): VITAMINB12, FOLATE, FERRITIN, TIBC, IRON, RETICCTPCT in the last 72 hours.  Coagulation profile  Recent Labs Lab 09/14/15 0405  INR 1.21    No results for input(s): DDIMER in the last 72 hours.  Cardiac Enzymes  Recent Labs Lab 09/14/15 0405 09/14/15 0822 09/14/15 1455  TROPONINI 0.05* 0.05* 0.04*   ------------------------------------------------------------------------------------------------------------------ Invalid input(s): POCBNP  No results for input(s): GLUCAP in the last 72 hours.   Amila Callies M.D. [facility] 09/15/2015, 11:09 AM  Pager: [phone] Between 7am to 7pm - call  Pager - [phone]  After 7pm go to [email or web address] - password [email or web address]  Call night coverage person covering after 7pm

## 2015-09-15 NOTE — Progress Notes (Signed)
PT Cancellation Note  Patient Details Name: Tiffany [name] MRN: [id number] DOB: 03/28/1923   Cancelled Treatment:    Reason Eval/Treat Not Completed: Patient at procedure or test/unavailable (attempted at 14:30 and pt undergoing test). Currently pt sleeping soundly; awakens and falls asleep mid-sentence. Eventually stated, "I just can't do it right now."    [name] 09/15/2015, 3:46 PM  Pager [phone]

## 2015-09-15 NOTE — Consult Note (Signed)
   [facility] Inpatient Consult   09/15/2015  Tiffany [name] 1923/01/05 [id number]   Patient evaluated for community based chronic disease management services with [facility] Care Management Program as a benefit of patient's Medicare Insurance. Spoke with patient at bedside to explain [facility] Care Management services. Admitted with HF exacerbation.  Patient endorses that Dr. Oliver Barre is her primary care provider but she does not see him very much.  Patient is very Hard of Hearing but denies she needs any hearing aids.  Consent form signed.  Patient states her son Tiffany [name] has recently moved in with her and he's her contact person.   Patient will receive post hospital discharge call and will be evaluated for monthly home visits for assessments and disease process education.  Left contact information and [facility] literature at bedside. Made Inpatient Case Manager aware that [facility] Care Management following. Of note, [facility] Care Management services does not replace or interfere with any services that are arranged by inpatient case management or social work.  For additional questions or referrals please contact:    Charlesetta Shanks, RN BSN CCM [facility]  [phone] business mobile phone Toll free office [phone]

## 2015-09-15 NOTE — Progress Notes (Signed)
  Echocardiogram 2D Echocardiogram has been performed.  Leta Jungling M 09/15/2015, 2:58 PM

## 2015-09-15 NOTE — Progress Notes (Signed)
DAILY PROGRESS NOTE  Subjective:  Net negative 1.1L so far recorded. Weight is down 5 lb. Echo not yet performed. Creatinine rising today with diuresis.  Breathing improved. Hypokalemic today at 3.2.   Objective:  Temp:  [97.3 F (36.3 C)-98.5 F (36.9 C)] 98.2 F (36.8 C) (04/27 0551) Pulse Rate:  [72-92] 72 (04/27 0910) Resp:  [18-20] 18 (04/27 0551) BP: (94-120)/(39-58) 94/39 mmHg (04/27 0910) SpO2:  [93 %-99 %] 94 % (04/27 0910) Weight:  [142 lb 11.2 oz (64.728 kg)] 142 lb 11.2 oz (64.728 kg) (04/27 0551) Weight change: -5 lb 11.2 oz (-2.586 kg)  Intake/Output from previous day: 04/26 0701 - 04/27 0700 In: 580 [P.O.:580] Out: 1250 [Urine:1250]  Intake/Output from this shift:    Medications: Current Facility-Administered Medications  Medication Dose Route Frequency Provider Last Rate Last Dose  . 0.9 %  sodium chloride infusion  250 mL Intravenous PRN Ivor Costa, MD      . acetaminophen (TYLENOL) tablet 650 mg  650 mg Oral Q4H PRN Ivor Costa, MD      . aspirin EC tablet 325 mg  325 mg Oral Daily Ivor Costa, MD   325 mg at 09/15/15 5364  . carvedilol (COREG) tablet 12.5 mg  12.5 mg Oral BID WC Ivor Costa, MD   12.5 mg at 09/15/15 6803  . enoxaparin (LOVENOX) injection 40 mg  40 mg Subcutaneous Q24H Ivor Costa, MD   40 mg at 09/15/15 0923  . furosemide (LASIX) injection 40 mg  40 mg Intravenous BID Ivor Costa, MD   40 mg at 09/15/15 2122  . lisinopril (PRINIVIL,ZESTRIL) tablet 2.5 mg  2.5 mg Oral Daily Ivor Costa, MD   2.5 mg at 09/15/15 4825  . ondansetron (ZOFRAN) injection 4 mg  4 mg Intravenous Q6H PRN Ivor Costa, MD      . sodium chloride flush (NS) 0.9 % injection 3 mL  3 mL Intravenous Q12H Ivor Costa, MD   3 mL at 09/15/15 0926  . sodium chloride flush (NS) 0.9 % injection 3 mL  3 mL Intravenous PRN Ivor Costa, MD        Physical Exam: General appearance: asleep, awakened for exam, no complaints Lungs: rales RLL Heart: regular rate and rhythm, S1, S2 normal, no  murmur, click, rub or gallop Extremities: edema trace edema  Lab Results: Results for orders placed or performed during the hospital encounter of 09/14/15 (from the past 48 hour(s))  Basic metabolic panel     Status: Abnormal   Collection Time: 09/14/15 12:37 AM  Result Value Ref Range   Sodium 139 135 - 145 mmol/L   Potassium 4.1 3.5 - 5.1 mmol/L   Chloride 106 101 - 111 mmol/L   CO2 20 (L) 22 - 32 mmol/L   Glucose, Bld 113 (H) 65 - 99 mg/dL   BUN 14 6 - 20 mg/dL   Creatinine, Ser 0.98 0.44 - 1.00 mg/dL   Calcium 9.2 8.9 - 10.3 mg/dL   GFR calc non Af Amer 49 (L) >60 mL/min   GFR calc Af Amer 57 (L) >60 mL/min    Comment: (NOTE) The eGFR has been calculated using the CKD EPI equation. This calculation has not been validated in all clinical situations. eGFR's persistently <60 mL/min signify possible Chronic Kidney Disease.    Anion gap 13 5 - 15  CBC     Status: Abnormal   Collection Time: 09/14/15 12:37 AM  Result Value Ref Range   WBC 9.9 4.0 - 10.5  K/uL   RBC 3.12 (L) 3.87 - 5.11 MIL/uL   Hemoglobin 10.0 (L) 12.0 - 15.0 g/dL   HCT 31.7 (L) 36.0 - 46.0 %   MCV 101.6 (H) 78.0 - 100.0 fL   MCH 32.1 26.0 - 34.0 pg   MCHC 31.5 30.0 - 36.0 g/dL   RDW 16.9 (H) 11.5 - 15.5 %   Platelets 196 150 - 400 K/uL  I-stat troponin, ED     Status: None   Collection Time: 09/14/15 12:55 AM  Result Value Ref Range   Troponin i, poc 0.04 0.00 - 0.08 ng/mL   Comment 3            Comment: Due to the release kinetics of cTnI, a negative result within the first hours of the onset of symptoms does not rule out myocardial infarction with certainty. If myocardial infarction is still suspected, repeat the test at appropriate intervals.   Brain natriuretic peptide     Status: Abnormal   Collection Time: 09/14/15  3:01 AM  Result Value Ref Range   B Natriuretic Peptide 1512.3 (H) 0.0 - 100.0 pg/mL  TSH     Status: None   Collection Time: 09/14/15  3:01 AM  Result Value Ref Range   TSH  1.882 0.350 - 4.500 uIU/mL  Protime-INR     Status: Abnormal   Collection Time: 09/14/15  4:05 AM  Result Value Ref Range   Prothrombin Time 15.4 (H) 11.6 - 15.2 seconds   INR 1.21 0.00 - 1.49  Troponin I     Status: Abnormal   Collection Time: 09/14/15  4:05 AM  Result Value Ref Range   Troponin I 0.05 (H) <0.031 ng/mL    Comment:        PERSISTENTLY INCREASED TROPONIN VALUES IN THE RANGE OF 0.04-0.49 ng/mL CAN BE SEEN IN:       -UNSTABLE ANGINA       -CONGESTIVE HEART FAILURE       -MYOCARDITIS       -CHEST TRAUMA       -ARRYHTHMIAS       -LATE PRESENTING MYOCARDIAL INFARCTION       -COPD   CLINICAL FOLLOW-UP RECOMMENDED.   Hemoglobin A1c     Status: None   Collection Time: 09/14/15  4:05 AM  Result Value Ref Range   Hgb A1c MFr Bld 5.5 4.8 - 5.6 %    Comment: (NOTE)         Pre-diabetes: 5.7 - 6.4         Diabetes: >6.4         Glycemic control for adults with diabetes: <7.0    Mean Plasma Glucose 111 mg/dL    Comment: (NOTE) Performed At: [facility] [place], [place] [id number] Lindon Romp MD VC:[id number]   Lipid panel     Status: None   Collection Time: 09/14/15  4:05 AM  Result Value Ref Range   Cholesterol 129 0 - 200 mg/dL   Triglycerides 89 <150 mg/dL   HDL 43 >40 mg/dL   Total CHOL/HDL Ratio 3.0 RATIO   VLDL 18 0 - 40 mg/dL   LDL Cholesterol 68 0 - 99 mg/dL    Comment:        Total Cholesterol/HDL:CHD Risk Coronary Heart Disease Risk Table                     Men   Women  1/2 Average Risk   3.4  3.3  Average Risk       5.0   4.4  2 X Average Risk   9.6   7.1  3 X Average Risk  23.4   11.0        Use the calculated Patient Ratio above and the CHD Risk Table to determine the patient's CHD Risk.        ATP III CLASSIFICATION (LDL):  <100     mg/dL   Optimal  100-129  mg/dL   Near or Above                    Optimal  130-159  mg/dL   Borderline  160-189  mg/dL   High  >190     mg/dL   Very High   Troponin I      Status: Abnormal   Collection Time: 09/14/15  8:22 AM  Result Value Ref Range   Troponin I 0.05 (H) <0.031 ng/mL    Comment:        PERSISTENTLY INCREASED TROPONIN VALUES IN THE RANGE OF 0.04-0.49 ng/mL CAN BE SEEN IN:       -UNSTABLE ANGINA       -CONGESTIVE HEART FAILURE       -MYOCARDITIS       -CHEST TRAUMA       -ARRYHTHMIAS       -LATE PRESENTING MYOCARDIAL INFARCTION       -COPD   CLINICAL FOLLOW-UP RECOMMENDED.   Troponin I     Status: Abnormal   Collection Time: 09/14/15  2:55 PM  Result Value Ref Range   Troponin I 0.04 (H) <0.031 ng/mL    Comment:        PERSISTENTLY INCREASED TROPONIN VALUES IN THE RANGE OF 0.04-0.49 ng/mL CAN BE SEEN IN:       -UNSTABLE ANGINA       -CONGESTIVE HEART FAILURE       -MYOCARDITIS       -CHEST TRAUMA       -ARRYHTHMIAS       -LATE PRESENTING MYOCARDIAL INFARCTION       -COPD   CLINICAL FOLLOW-UP RECOMMENDED.   Basic metabolic panel     Status: Abnormal   Collection Time: 09/15/15  2:56 AM  Result Value Ref Range   Sodium 141 135 - 145 mmol/L   Potassium 3.2 (L) 3.5 - 5.1 mmol/L    Comment: DELTA CHECK NOTED   Chloride 99 (L) 101 - 111 mmol/L   CO2 30 22 - 32 mmol/L   Glucose, Bld 116 (H) 65 - 99 mg/dL   BUN 13 6 - 20 mg/dL   Creatinine, Ser 1.16 (H) 0.44 - 1.00 mg/dL   Calcium 8.8 (L) 8.9 - 10.3 mg/dL   GFR calc non Af Amer 40 (L) >60 mL/min   GFR calc Af Amer 46 (L) >60 mL/min    Comment: (NOTE) The eGFR has been calculated using the CKD EPI equation. This calculation has not been validated in all clinical situations. eGFR's persistently <60 mL/min signify possible Chronic Kidney Disease.    Anion gap 12 5 - 15    Imaging: Dg Chest 2 View  09/14/2015  CLINICAL DATA:  Acute onset of shortness of breath. Initial encounter. EXAM: CHEST  2 VIEW COMPARISON:  Chest radiograph performed 07/27/2015 FINDINGS: The lungs are well-aerated. Small bilateral pleural effusions are seen. Vascular congestion is noted. Increased  interstitial markings raise concern for pulmonary edema. There is no evidence of pneumothorax. The heart  is enlarged. A pacemaker is noted at the left chest wall, with leads ending at the right atrium and right ventricle. No acute osseous abnormalities are seen. Degenerative change is noted at the left glenohumeral joint. IMPRESSION: Vascular congestion and cardiomegaly. Increased interstitial markings raise concern for pulmonary edema. Small bilateral pleural effusions seen. Electronically Signed   By: Garald Balding M.D.   On: 09/14/2015 01:37    Assessment:  1. Principal Problem: 2.   Acute on chronic systolic (congestive) heart failure ([place]) 3. Active Problems: 4.   Essential hypertension, benign 5.   Hyperlipidemia 6.   Hypothyroidism 7.   Complete heart block ([place]) 8.   Pacemaker infection ([place]) 9.   CHF exacerbation ([place]) 10.   Plan:  1. Continue iv diuresis today. Repleted potassium this morning. Discontinued ACE-I - due to rising creatinine. Switch to po lasix tomorrow. Ambulate today.  Time Spent Directly with Patient:  15 minutes  Length of Stay:    Pixie Casino, MD, [facility] Attending Cardiologist [id number] 09/15/2015, 9:46 AM

## 2015-09-16 ENCOUNTER — Other Ambulatory Visit: Payer: Self-pay

## 2015-09-16 DIAGNOSIS — N179 Acute kidney failure, unspecified: Secondary | ICD-10-CM | POA: Diagnosis present

## 2015-09-16 LAB — BASIC METABOLIC PANEL
ANION GAP: 12 (ref 5–15)
BUN: 14 mg/dL (ref 6–20)
CHLORIDE: 101 mmol/L (ref 101–111)
CO2: 28 mmol/L (ref 22–32)
Calcium: 8.8 mg/dL — ABNORMAL LOW (ref 8.9–10.3)
Creatinine, Ser: 1.38 mg/dL — ABNORMAL HIGH (ref 0.44–1.00)
GFR calc non Af Amer: 32 mL/min — ABNORMAL LOW (ref >60)
GFR, EST AFRICAN AMERICAN: 37 mL/min — AB (ref >60)
Glucose, Bld: 157 mg/dL — ABNORMAL HIGH (ref 65–99)
POTASSIUM: 3.8 mmol/L (ref 3.5–5.1)
SODIUM: 141 mmol/L (ref 135–145)

## 2015-09-16 MED ORDER — FUROSEMIDE 40 MG PO TABS: 40.0000 mg | ORAL_TABLET | Freq: Every day | ORAL | Status: DC

## 2015-09-16 MED ORDER — FUROSEMIDE 20 MG PO TABS
20.0000 mg | ORAL_TABLET | Freq: Every day | ORAL | Status: DC
  Administered 2015-09-17: 20 mg via ORAL
  Filled 2015-09-16: qty 1

## 2015-09-16 NOTE — Evaluation (Signed)
Occupational Therapy Evaluation Patient Details Name: Tiffany [name] MRN: [id number] DOB: 05/28/1922 Today's Date: 09/16/2015    History of Present Illness Patient is a 82 yo female admitted with CHF exacerbation.  PMH:  NICM, pacemaker/ICK, complete heart block, HTN, CHF, HOH   Clinical Impression   Pt seen for OT eval. Overall at supervision level but does fatigue with minimal activity which is different from her baseline. Would benefit from OT for additional energy conservation education and to progress ADL independence. Will follow.    Follow Up Recommendations  Home health OT;Supervision - Intermittent (if agreeable.)    Equipment Recommendations  None recommended by OT    Recommendations for Other Services       Precautions / Restrictions Precautions Precautions: Fall Restrictions Weight Bearing Restrictions: No      Mobility Bed Mobility Overal bed mobility: Modified Independent                Transfers Overall transfer level: Needs assistance Equipment used: None Transfers: Sit to/from Stand Sit to Stand: Supervision         General transfer comment: supervision for safety    Balance   Sitting-balance support: No upper extremity supported;Feet supported Sitting balance-Leahy Scale: Fair     Standing balance support: No upper extremity supported;During functional activity Standing balance-Leahy Scale: Fair                              ADL Overall ADL's : Needs assistance/impaired Eating/Feeding: Independent;Sitting   Grooming: Wash/dry hands;Supervision/safety;Standing   Upper Body Bathing: Set up;Sitting   Lower Body Bathing: Supervison/ safety;Sit to/from stand   Upper Body Dressing : Set up;Sitting   Lower Body Dressing: Supervision/safety;Sit to/from stand   Toilet Transfer: Supervision/safety;Ambulation;Comfort height toilet;Grab bars   Toileting- Clothing Manipulation and Hygiene: Supervision/safety;Sit to/from  stand         General ADL Comments: Pt up to the bathroom to void. Upon return from bathroom, O2 sats 96% on RA HR 114. After several minutes of rest HR down to 105. Pt wtih 2/4 dyspnea with activity and needed to rest before scooting up in the bed. She would benefit from additional energy conservation education. Discussed purse lip breathing and rest breaks. She has a seat already that she sits on for bathing. Also recommended pt have son present when she does initial showers once up for safety and pt agreed this is a good idea.     Vision     Perception     Praxis      Pertinent Vitals/Pain Pain Assessment: No/denies pain     Hand Dominance Right   Extremity/Trunk Assessment Upper Extremity Assessment Upper Extremity Assessment: Generalized weakness           Communication Communication Communication: HOH   Cognition Arousal/Alertness: Awake/alert Behavior During Therapy: WFL for tasks assessed/performed Overall Cognitive Status: Within Functional Limits for tasks assessed                     General Comments       Exercises      Shoulder Instructions      Home Living Family/patient expects to be discharged to:: Private residence Living Arrangements: Alone Available Help at Discharge: Family;Available PRN/intermittently (son) Type of Home: House Home Access: Stairs to enter [employer] of Steps: 1   Home Layout: Multi-level;Bed/bath upstairs Alternate Level Stairs-Number of Steps: 3 (up to bedroom/kitchen level; doesn't go down to den (  8 steps) Alternate Level Stairs-Rails: Right Bathroom Shower/Tub: Chief Strategy Officer: Standard     Home Equipment: Shower seat;Toilet riser   Additional Comments: All information from pt      Prior Functioning/Environment Level of Independence: Needs assistance  Gait / Transfers Assistance Needed: Independent, denies any h/o falls ADL's / Homemaking Assistance Needed: cooks simple  meals or orders out; does not do housework; son brings groceries   Comments: pt uses seat in tub to bathe, has riser on commode    OT Diagnosis: Generalized weakness   OT Problem List: Decreased strength   OT Treatment/Interventions: Self-care/ADL training;DME and/or AE instruction;Energy conservation;Patient/family education;Therapeutic activities    OT Goals(Current goals can be found in the care plan section) Acute Rehab OT Goals Patient Stated Goal: return home OT Goal Formulation: With patient Time For Goal Achievement: 09/30/15 Potential to Achieve Goals: Good  OT Frequency: Min 2X/week   Barriers to D/C:            Co-evaluation              End of Session    Activity Tolerance: Patient limited by fatigue (some fatigue at end of session) Patient left: in bed;with call bell/phone within reach;with bed alarm set   Time: 8469-6295 OT Time Calculation (min): 21 min Charges:  OT General Charges $OT Visit: 1 Procedure OT Evaluation $OT Eval Low Complexity: 1 Procedure G-Codes:    Lennox Laity 09/16/2015, 2:22 PM

## 2015-09-16 NOTE — Care Management Note (Signed)
Case Management Note  Patient Details  Name: Tiffany [name] MRN: [id number] Date of Birth: Jun 22, 1922  Subjective/Objective:    Admitted with Acute on Chronic CHF                Action/Plan: Patient lives at home with son; patient states that she does not use any assistive device for walking, does her own cooking. She also states that she does not have any problem getting her medication. CM talked to patient about HHC services, patient politely refused, stated " Honey, I am fine, my son is with me and if I need anything he will get it for me." CM talked to patient about the benefit of HHC services, patient refused again. CM will continue to follow for DCP.  Expected Discharge Date:     Possibly 09/18/2015             Expected Discharge Plan:  Home w Home Health Services  Discharge planning Services  CM Consult Choice offered to:  Patient  [facility] Arranged:  Patient Refused  Status of Service:  In process, will continue to follow  Reola Mosher [phone] 09/16/2015, 1:49 PM

## 2015-09-16 NOTE — Progress Notes (Signed)
Pt states she does not want to be a DNR and does not know who ordered DNR. Benedetto Coons paged and new order for FULL code.   Pt BP in 90s this AM, per t callahan to hold coreg this AM.

## 2015-09-16 NOTE — Progress Notes (Signed)
DAILY PROGRESS NOTE  Subjective:  1.6L negative. Creatinine rising. Breathing improved.  Objective:  Temp:  [97.5 F (36.4 C)] 97.5 F (36.4 C) (04/28 0603) Pulse Rate:  [73-80] 77 (04/28 0603) Resp:  [16-18] 16 (04/28 0603) BP: (94-103)/(41-65) 95/65 mmHg (04/28 0630) SpO2:  [97 %-100 %] 98 % (04/28 0821) Weight:  [141 lb 9.6 oz (64.229 kg)] 141 lb 9.6 oz (64.229 kg) (04/28 0603) Weight change: -1 lb 1.6 oz (-0.499 kg)  Intake/Output from previous day: 04/27 0701 - 04/28 0700 In: 840 [P.O.:840] Out: 1300 [Urine:1300]  Intake/Output from this shift: Total I/O In: 360 [P.O.:360] Out: 1 [Stool:1]  Medications: Current Facility-Administered Medications  Medication Dose Route Frequency Provider Last Rate Last Dose  . 0.9 %  sodium chloride infusion  250 mL Intravenous PRN Ivor Costa, MD      . acetaminophen (TYLENOL) tablet 650 mg  650 mg Oral Q4H PRN Ivor Costa, MD      . aspirin EC tablet 325 mg  325 mg Oral Daily Ivor Costa, MD   325 mg at 09/16/15 0817  . carvedilol (COREG) tablet 12.5 mg  12.5 mg Oral BID WC Ivor Costa, MD   Stopped at 09/16/15 0700  . enoxaparin (LOVENOX) injection 30 mg  30 mg Subcutaneous Q24H Ripudeep K Rai, MD   30 mg at 09/16/15 0817  . furosemide (LASIX) injection 40 mg  40 mg Intravenous BID Ivor Costa, MD   40 mg at 09/16/15 6073  . ondansetron (ZOFRAN) injection 4 mg  4 mg Intravenous Q6H PRN Ivor Costa, MD      . sodium chloride flush (NS) 0.9 % injection 3 mL  3 mL Intravenous Q12H Ivor Costa, MD   3 mL at 09/16/15 0818  . sodium chloride flush (NS) 0.9 % injection 3 mL  3 mL Intravenous PRN Ivor Costa, MD        Physical Exam: General appearance: Awake, NAD Lungs: clear to auscultation bilaterally Heart: regular rate and rhythm, S1, S2 normal, no murmur, click, rub or gallop Extremities: edema trace edema  Lab Results: Results for orders placed or performed during the hospital encounter of 09/14/15 (from the past 48 hour(s))  Troponin I      Status: Abnormal   Collection Time: 09/14/15  2:55 PM  Result Value Ref Range   Troponin I 0.04 (H) <0.031 ng/mL    Comment:        PERSISTENTLY INCREASED TROPONIN VALUES IN THE RANGE OF 0.04-0.49 ng/mL CAN BE SEEN IN:       -UNSTABLE ANGINA       -CONGESTIVE HEART FAILURE       -MYOCARDITIS       -CHEST TRAUMA       -ARRYHTHMIAS       -LATE PRESENTING MYOCARDIAL INFARCTION       -COPD   CLINICAL FOLLOW-UP RECOMMENDED.   Basic metabolic panel     Status: Abnormal   Collection Time: 09/15/15  2:56 AM  Result Value Ref Range   Sodium 141 135 - 145 mmol/L   Potassium 3.2 (L) 3.5 - 5.1 mmol/L    Comment: DELTA CHECK NOTED   Chloride 99 (L) 101 - 111 mmol/L   CO2 30 22 - 32 mmol/L   Glucose, Bld 116 (H) 65 - 99 mg/dL   BUN 13 6 - 20 mg/dL   Creatinine, Ser 1.16 (H) 0.44 - 1.00 mg/dL   Calcium 8.8 (L) 8.9 - 10.3 mg/dL   GFR calc non Af  Amer 40 (L) >60 mL/min   GFR calc Af Amer 46 (L) >60 mL/min    Comment: (NOTE) The eGFR has been calculated using the CKD EPI equation. This calculation has not been validated in all clinical situations. eGFR's persistently <60 mL/min signify possible Chronic Kidney Disease.    Anion gap 12 5 - 15  Basic metabolic panel     Status: Abnormal   Collection Time: 09/16/15  3:09 AM  Result Value Ref Range   Sodium 141 135 - 145 mmol/L   Potassium 3.8 3.5 - 5.1 mmol/L   Chloride 101 101 - 111 mmol/L   CO2 28 22 - 32 mmol/L   Glucose, Bld 157 (H) 65 - 99 mg/dL   BUN 14 6 - 20 mg/dL   Creatinine, Ser 1.38 (H) 0.44 - 1.00 mg/dL   Calcium 8.8 (L) 8.9 - 10.3 mg/dL   GFR calc non Af Amer 32 (L) >60 mL/min   GFR calc Af Amer 37 (L) >60 mL/min    Comment: (NOTE) The eGFR has been calculated using the CKD EPI equation. This calculation has not been validated in all clinical situations. eGFR's persistently <60 mL/min signify possible Chronic Kidney Disease.    Anion gap 12 5 - 15    Imaging: No results found.  Assessment:  Principal  Problem:   Acute on chronic systolic (congestive) heart failure (HCC) Active Problems:   Essential hypertension, benign   Hyperlipidemia   Hypothyroidism   Complete heart block (HCC)   Pacemaker infection ([place])   CHF exacerbation ([place])   Plan: 1. Hold lasix today - restart lasix 20 mg po daily tomorrow. Continue to hold lisinopril due to rising creatinine. Would monitor today and if creatinine improved tomorrow, could likely discharge.   Time Spent Directly with Patient:  15 minutes  Length of Stay:  LOS: 1 day   Pixie Casino, MD, [facility] Attending Cardiologist [place] 09/16/2015, 10:48 AM

## 2015-09-16 NOTE — Progress Notes (Signed)
[facility]                                                                              Patient Demographics  Tiffany [name], is a 82 y.o. female, DOB - 1923-01-25, ZOX:[id number]  Admit date - 09/14/2015   Admitting Physician Lorretta Harp, MD  Outpatient Primary MD for the patient is Oliver Barre, MD  Outpatient specialists: Cardiology, Dr. Ladona Ridgel  LOS - 1  days    Chief Complaint  Patient presents with  . Shortness of Breath       Brief summary   Patient is a 82 year old female with complete heart block, status post pacemaker placement, hypertension, hypothyroidism, gout, systolic CHF, hearing deficit presented with shortness of breath and leg edema, started day prior to admission, progressively getting worse. In the ED, O2 sats 80% on room air, otherwise denied any cough, chest pain or any fevers or chills. Patient was hospitalized 2/22 3/7 due to pacemaker ICD system infection, had completed 14 days of antibiotics. In ED, troponin negative, labs within normal limits, chest x-ray consistent with pulmonary edema, BNP elevated at 1512. Patient was admitted for further workup.   Assessment & Plan    Principal Problem: Acute on chronic systolic (congestive) heart failure [facility]): Patient presented with peripheral edema, dyspnea, elevated BNP.  - Placed on IV diuresis, negative balance of 1.1 L .  - Cr trending up, holding lasix today   - ACE inhibitor discontinued due to creatinine trending up - 2D echo showed EF 40-45%, grade 1 diastolic dysfunction  HTN: - continue Coreg, Lasix   HLD: Last LDL was 129 on 03/27/13.  - Not taking meds at home now.Lipid panel showed LDL 68, cholesterol 129   Hypothyroidism: Last TSH was 6.432 on 03/27/13. Used to be on Synthroid, which was discontinued. - TSH 1.8   Hx of Complete heart block (HCC): s/p AICD and had pacemaker infection recently, completed antibiotics - ICD interrogated  Hypokalemia Replace  Code Status: DO NOT  RESUSCITATE   Family Communication: Discussed in detail with the patient, all imaging results, lab results explained to the patient.   Disposition Plan:   Time Spent in minutes    Procedures  None   Consults   cardiology  DVT Prophylaxis  Lovenox   Medications  Scheduled Meds: . aspirin EC  325 mg Oral Daily  . carvedilol  12.5 mg Oral BID WC  . enoxaparin (LOVENOX) injection  30 mg Subcutaneous Q24H  . [START ON 09/17/2015] furosemide  20 mg Oral Daily  . sodium chloride flush  3 mL Intravenous Q12H   Continuous Infusions:  PRN Meds:.sodium chloride, acetaminophen, ondansetron (ZOFRAN) IV, sodium chloride flush   Antibiotics   Anti-infectives    None        Subjective:   Tiffany [name] was seen and examined today. Doing well, no specific complaints this morning,  Hoping to go home soon. No chest pain or dizziness.  Patient denies dizziness, abdominal pain, N/V/D/C, new weakness, numbess, tingling. No acute events overnight.    Objective:   Filed Vitals:   09/15/15 2330 09/16/15 0603 09/16/15 0630 09/16/15 0821  BP:  99/45 95/65   Pulse: 75 77    Temp:  97.5 F (36.4 C)    TempSrc:  Oral    Resp:  16    Height:      Weight:  64.229 kg (141 lb 9.6 oz)    SpO2: 100% 100%  98%    Intake/Output Summary (Last 24 hours) at 09/16/15 1118 Last data filed at 09/16/15 1109  Gross per 24 hour  Intake   1200 ml  Output   1301 ml  Net   -101 ml     Wt Readings from Last 3 Encounters:  09/16/15 64.229 kg (141 lb 9.6 oz)  07/11/15 72.576 kg (160 lb)  06/30/15 72.576 kg (160 lb)     Exam  General: Alert and oriented x 3, NAD  HEENT:    Neck: Supple, No JVD  CVS: S1 S2 auscultated, no rubs, murmurs or gallops. Regular rate and rhythm.  Respiratory: Fairly clear to auscultation bilaterally  Abdomen: Soft, nontender, nondistended, + bowel sounds  Ext: no cyanosis clubbing, trace edema  Neuro:  no new complaints  Skin: No rashes  Psych:  Normal affect and demeanor, alert and oriented    Data Reviewed:  I have personally reviewed following labs and imaging studies  Micro Results No results found for this or any previous visit (from the past 240 hour(s)).  Radiology Reports Dg Chest 2 View  09/14/2015  CLINICAL DATA:  Acute onset of shortness of breath. Initial encounter. EXAM: CHEST  2 VIEW COMPARISON:  Chest radiograph performed 07/27/2015 FINDINGS: The lungs are well-aerated. Small bilateral pleural effusions are seen. Vascular congestion is noted. Increased interstitial markings raise concern for pulmonary edema. There is no evidence of pneumothorax. The heart is enlarged. A pacemaker is noted at the left chest wall, with leads ending at the right atrium and right ventricle. No acute osseous abnormalities are seen. Degenerative change is noted at the left glenohumeral joint. IMPRESSION: Vascular congestion and cardiomegaly. Increased interstitial markings raise concern for pulmonary edema. Small bilateral pleural effusions seen. Electronically Signed   By: Roanna Raider M.D.   On: 09/14/2015 01:37    CBC  Recent Labs Lab 09/14/15 0037  WBC 9.9  HGB 10.0*  HCT 31.7*  PLT 196  MCV 101.6*  MCH 32.1  MCHC 31.5  RDW 16.9*    Chemistries   Recent Labs Lab 09/14/15 0037 09/15/15 0256 09/16/15 0309  NA 139 141 141  K 4.1 3.2* 3.8  CL 106 99* 101  CO2 20* 30 28  GLUCOSE 113* 116* 157*  BUN CREATININE 0.98 1.16* 1.38*  CALCIUM 9.2 8.8* 8.8*   ------------------------------------------------------------------------------------------------------------------ estimated creatinine clearance is 22.9 mL/min (by C-G formula based on Cr of 1.38). ------------------------------------------------------------------------------------------------------------------  Recent Labs  09/14/15 0405  HGBA1C 5.5    ------------------------------------------------------------------------------------------------------------------  Recent Labs  09/14/15 0405  CHOL 129  HDL 43  LDLCALC 68  TRIG 89  CHOLHDL 3.0   ------------------------------------------------------------------------------------------------------------------  Recent Labs  09/14/15 0301  TSH 1.882   ------------------------------------------------------------------------------------------------------------------ No results for input(s): VITAMINB12, FOLATE, FERRITIN, TIBC, IRON, RETICCTPCT in the last 72 hours.  Coagulation profile  Recent Labs Lab 09/14/15 0405  INR 1.21    No results for input(s): DDIMER in the last 72 hours.  Cardiac Enzymes  Recent Labs Lab 09/14/15 0405 09/14/15 0822 09/14/15 1455  TROPONINI 0.05* 0.05* 0.04*   ------------------------------------------------------------------------------------------------------------------ Invalid input(s): POCBNP  No results for input(s): GLUCAP in the last 72 hours.   Tiffany [name] M.D. [facility] 09/16/2015, 11:18 AM  Pager: [phone] Between 7am to 7pm - call Pager - [phone]  After 7pm go to [email or web address] - password [email or web address]  Call night coverage person covering after 7pm

## 2015-09-16 NOTE — Progress Notes (Addendum)
Physical Therapy Treatment Patient Details Name: Tiffany [name] MRN: [id number] DOB: 1922/12/25 Today's Date: 09/16/2015    History of Present Illness Patient is a 82 yo female admitted with CHF exacerbation.  PMH:  NICM, pacemaker/ICK, complete heart block, HTN, CHF, HOH    PT Comments    Pt admitted with above diagnosis. Pt currently with functional limitations due to balance and endurance deficits. Pt was able to ambulate without device with overall good balance with one LOB of which she self corrected.  Instructed pt she may want to use a RW for ultimate safety but pt declines.  Will continue PT as pt allows and recommend HHPT on d/c.    Pt will benefit from skilled PT to increase their independence and safety with mobility to allow discharge to the venue listed below.    Follow Up Recommendations  Home health PT;Supervision - Intermittent     Equipment Recommendations  Rolling walker with 5" wheels    Recommendations for Other Services       Precautions / Restrictions Precautions Precautions: Fall Restrictions Weight Bearing Restrictions: No    Mobility  Bed Mobility Overal bed mobility: Modified Independent                Transfers Overall transfer level: Needs assistance Equipment used: None Transfers: Sit to/from Stand Sit to Stand: Supervision         General transfer comment: x 2; denies dizziness, no imbalance  Ambulation/Gait Ambulation/Gait assistance: Supervision Ambulation Distance (Feet): 200 Feet Assistive device: None Gait Pattern/deviations: Step-through pattern;Decreased stride length (kyphotic)   Gait velocity interpretation: at or above normal speed for age/gender General Gait Details: Pt agreed only to walk to nursing station and back however pt was able to perform without physical assist.   Pt with one LOB of which she self corrected.  Discussed to pt that she may want to use a RW for incr stability of which pt stated,"I don't need  that."   Stairs            Wheelchair Mobility    Modified Rankin (Stroke Patients Only)       Balance   Sitting-balance support: No upper extremity supported;Feet supported Sitting balance-Leahy Scale: Fair     Standing balance support: No upper extremity supported;During functional activity Standing balance-Leahy Scale: Fair                      Cognition Arousal/Alertness: Awake/alert Behavior During Therapy: WFL for tasks assessed/performed Overall Cognitive Status: Within Functional Limits for tasks assessed                      Exercises General Exercises - Lower Extremity Ankle Circles/Pumps: AROM;Both;10 reps;Seated Long Arc Quad: AROM;Both;10 reps;Seated    General Comments General comments (skin integrity, edema, etc.): Wanted to do more balance activities but pt declined.       Pertinent Vitals/Pain Pain Assessment: No/denies pain  VSS with sats >90% on RA.     Home Living                      Prior Function            PT Goals (current goals can now be found in the care plan section) Progress towards PT goals: Progressing toward goals    Frequency  Min 3X/week    PT Plan Current plan remains appropriate    Co-evaluation  End of Session Equipment Utilized During Treatment: Gait belt Activity Tolerance: Patient tolerated treatment well Patient left: in chair;with call bell/phone within reach;with chair alarm set     Time: 0906-0920 PT Time Calculation (min) (ACUTE ONLY): 14 min  Charges:  $Gait Training: 8-22 mins                    G CodesBerline Lopes 2015/10/03, 12:32 PM Lashea Goda,PT Acute Rehabilitation [phone] (pager)

## 2015-09-17 DIAGNOSIS — E039 Hypothyroidism, unspecified: Secondary | ICD-10-CM

## 2015-09-17 DIAGNOSIS — I442 Atrioventricular block, complete: Secondary | ICD-10-CM

## 2015-09-17 LAB — BASIC METABOLIC PANEL
ANION GAP: 9 (ref 5–15)
BUN: 16 mg/dL (ref 6–20)
CHLORIDE: 98 mmol/L — AB (ref 101–111)
CO2: 32 mmol/L (ref 22–32)
CREATININE: 1.09 mg/dL — AB (ref 0.44–1.00)
Calcium: 8.7 mg/dL — ABNORMAL LOW (ref 8.9–10.3)
GFR calc non Af Amer: 43 mL/min — ABNORMAL LOW (ref >60)
GFR, EST AFRICAN AMERICAN: 50 mL/min — AB (ref >60)
Glucose, Bld: 124 mg/dL — ABNORMAL HIGH (ref 65–99)
Potassium: 3.3 mmol/L — ABNORMAL LOW (ref 3.5–5.1)
SODIUM: 139 mmol/L (ref 135–145)

## 2015-09-17 MED ORDER — FUROSEMIDE 20 MG PO TABS: 20.0000 mg | ORAL_TABLET | Freq: Every day | ORAL | Status: DC

## 2015-09-17 MED ORDER — POTASSIUM CHLORIDE CRYS ER 10 MEQ PO TBCR: 10.0000 meq | EXTENDED_RELEASE_TABLET | Freq: Every day | ORAL | Status: AC

## 2015-09-17 MED ORDER — POTASSIUM CHLORIDE CRYS ER 20 MEQ PO TBCR
40.0000 meq | EXTENDED_RELEASE_TABLET | Freq: Once | ORAL | Status: AC
  Administered 2015-09-17: 40 meq via ORAL
  Filled 2015-09-17: qty 2

## 2015-09-17 NOTE — Progress Notes (Signed)
ReDS Vest Discharge Study  Results of ReDS reading  Your patient is in the Unblinded arm of the Vest at Discharge study.  The ReDS reading is:   ( < 39) = 33  Your patient is ok for discharge.    Thank You   The research team    

## 2015-09-17 NOTE — Discharge Summary (Signed)
Physician Discharge Summary   Patient ID: Tiffany [name] MRN: [id number] DOB/AGE: 1923-05-09 82 y.o.  Admit date: 09/14/2015 Discharge date: 09/17/2015  Primary Care Physician:  Oliver Barre, MD  Discharge Diagnoses:    . Acute on chronic systolic (congestive) heart failure (HCC) . Essential hypertension, benign . Hyperlipidemia . Hypothyroidism . Complete heart block (HCC) . Pacemaker infection (HCC) . AKI (acute kidney injury) [facility])  Consults: Cardiology  Recommendations for Outpatient Follow-up:  1. Home health, PT, OT, RN arranged 2. Please repeat CBC/BMET at next visit   DIET: heart healthy    Allergies:  No Known Allergies   DISCHARGE MEDICATIONS: Current Discharge Medication List    START taking these medications   Details  furosemide (LASIX) 20 MG tablet Take 1 tablet (20 mg total) by mouth daily. Qty: 30 tablet, Refills: 3    potassium chloride SA (K-DUR,KLOR-CON) 10 MEQ tablet Take 1 tablet (10 mEq total) by mouth daily. Qty: 30 tablet, Refills: 3      CONTINUE these medications which have NOT CHANGED   Details  aspirin 325 MG EC tablet Take 325 mg by mouth every 6 (six) hours as needed for pain.     carvedilol (COREG) 12.5 MG tablet Take 1 tablet (12.5 mg total) by mouth 2 (two) times daily. Qty: 60 tablet, Refills: 11      STOP taking these medications     colchicine 0.6 MG tablet      traMADol (ULTRAM) 50 MG tablet          Brief H and P: For complete details please refer to admission H and P, but in brief Patient is a 82 year old female with complete heart block, status post pacemaker placement, hypertension, hypothyroidism, gout, systolic CHF, hearing deficit presented with shortness of breath and leg edema, started day prior to admission, progressively getting worse. In the ED, O2 sats 80% on room air, otherwise denied any cough, chest pain or any fevers or chills. Patient was hospitalized 2/22 3/7 due to pacemaker ICD system infection, had  completed 14 days of antibiotics. In ED, troponin negative, labs within normal limits, chest x-ray consistent with pulmonary edema, BNP elevated at 1512. Patient was admitted for further workup.  Hospital Course:   Acute on chronic systolic (congestive) heart failure [facility]): Patient presented with peripheral edema, dyspnea, elevated BNP.  - Patient was placed on IV diuresis. Cardiology was consulted. Negative balance of 1.5 L. Weight down from 148 lbs on admission to 141 lbs. Creatinine trended up to 1.3 hence Lasix was held. Now creatinine function has improved to 1.0. Cardiology recommended to transition to oral Lasix. - 2D echo showed EF 40-45%, grade 1 diastolic dysfunction  HTN: - continue Coreg, Lasix   HLD: Last LDL was 129 on 03/27/13.  - Not taking meds at home now.Lipid panel showed LDL 68, cholesterol 129   Hypothyroidism: Last TSH was 6.432 on 03/27/13. Used to be on Synthroid, which was discontinued. - TSH 1.8   Hx of Complete heart block (HCC): s/p AICD and had pacemaker infection recently, completed antibiotics - ICD interrogated  Hypokalemia Replaced  PT elevation was done and patient qualified for home health PT, arranged by case management  Day of Discharge BP 115/59 mmHg  Pulse 75  Temp(Src) 97.7 F (36.5 C) (Oral)  Resp 18  Ht  (1.626 m)  Wt 64.4 kg (141 lb 15.6 oz)  BMI 24.36 kg/m2  SpO2 100%  Physical Exam: General: Alert and awake oriented x3 not in any acute  distress. HEENT: anicteric sclera, pupils reactive to light and accommodation CVS: S1-S2 clear no murmur rubs or gallops Chest: clear to auscultation bilaterally, no wheezing rales or rhonchi Abdomen: soft nontender, nondistended, normal bowel sounds Extremities: no cyanosis, clubbing or edema noted bilaterally Neuro: Cranial nerves II-XII intact, no focal neurological deficits   The results of significant diagnostics from this hospitalization (including imaging, microbiology, ancillary  and laboratory) are listed below for reference.    LAB RESULTS: Basic Metabolic Panel:  Recent Labs Lab 09/16/15 0309 09/17/15 0452  NA 141 139  K 3.8 3.3*  CL 101 98*  CO2 28 32  GLUCOSE 157* 124*  BUN 14 16  CREATININE 1.38* 1.09*  CALCIUM 8.8* 8.7*   Liver Function Tests: No results for input(s): AST, ALT, ALKPHOS, BILITOT, PROT, ALBUMIN in the last 168 hours. No results for input(s): LIPASE, AMYLASE in the last 168 hours. No results for input(s): AMMONIA in the last 168 hours. CBC:  Recent Labs Lab 09/14/15 0037  WBC 9.9  HGB 10.0*  HCT 31.7*  MCV 101.6*  PLT 196   Cardiac Enzymes:  Recent Labs Lab 09/14/15 0822 09/14/15 1455  TROPONINI 0.05* 0.04*   BNP: Invalid input(s): POCBNP CBG: No results for input(s): GLUCAP in the last 168 hours.  Significant Diagnostic Studies:  Dg Chest 2 View  09/14/2015  CLINICAL DATA:  Acute onset of shortness of breath. Initial encounter. EXAM: CHEST  2 VIEW COMPARISON:  Chest radiograph performed 07/27/2015 FINDINGS: The lungs are well-aerated. Small bilateral pleural effusions are seen. Vascular congestion is noted. Increased interstitial markings raise concern for pulmonary edema. There is no evidence of pneumothorax. The heart is enlarged. A pacemaker is noted at the left chest wall, with leads ending at the right atrium and right ventricle. No acute osseous abnormalities are seen. Degenerative change is noted at the left glenohumeral joint. IMPRESSION: Vascular congestion and cardiomegaly. Increased interstitial markings raise concern for pulmonary edema. Small bilateral pleural effusions seen. Electronically Signed   By: Roanna Raider M.D.   On: 09/14/2015 01:37    2D ECHO: Study Conclusions  - Left ventricle: The cavity size was normal. There was moderate  concentric hypertrophy. Systolic function was mildly to  moderately reduced. The estimated ejection fraction was in the  range of 40% to 45%. Wall motion was  normal; there were no  regional wall motion abnormalities. Doppler parameters are  consistent with abnormal left ventricular relaxation (grade 1  diastolic dysfunction). - Ventricular septum: Septal motion showed abnormal function and  dyssynergy. Consistent with RV pacing. - Aortic valve: Transvalvular velocity was within the normal range.  There was no stenosis. There was no regurgitation. - Mitral valve: Transvalvular velocity was within the normal range.  There was no evidence for stenosis. There was mild regurgitation. - Left atrium: The atrium was moderately to severely dilated. - Right ventricle: The cavity size was normal. Wall thickness was  normal. Systolic function was normal. - Tricuspid valve: There was mild regurgitation. - Pulmonary arteries: Systolic pressure was within the normal  range. - Inferior vena cava: The vessel was normal in size. The  respirophasic diameter changes were in the normal range (>= 50%),  consistent with normal central venous pressure. - Pericardium, extracardiac: A trivial pericardial effusion was  identified.  Disposition and Follow-up: Discharge Instructions    (HEART FAILURE PATIENTS) Call MD:  Anytime you have any of the following symptoms: 1) 3 pound weight gain in 24 hours or 5 pounds in 1 week 2) shortness  of breath, with or without a dry hacking cough 3) swelling in the hands, feet or stomach 4) if you have to sleep on extra pillows at night in order to breathe.    Complete by:  As directed      AMB Referral to [facility] Care Management    Complete by:  As directed   Reason for consult:  HF exacerbation with recent pacemaker infection, 2nd admission  Diagnoses of:  Heart Failure  Expected date of contact:  1-3 days (reserved for hospital discharges)  Please assign to community nurse for transition of care calls and assess for home visits. Questions please call:   Charlesetta Shanks, RN BSN CCM [facility]   [phone] business mobile phone Toll free office [phone]     Diet - low sodium heart healthy    Complete by:  As directed      Increase activity slowly    Complete by:  As directed             DISPOSITION: home    DISCHARGE FOLLOW-UP Follow-up Information    Follow up with Oliver Barre, MD. Schedule an appointment as soon as possible for a visit in 10 days.   Specialties:  Internal Medicine, Radiology   Why:  for hospital follow-up, obtain labs BMET   Contact information:   [address] [place]walbe [facility] [place] [address] [phone]       Follow up with Lewayne Bunting, MD On 10/12/2015.   Specialty:  Cardiology   Why:  at 11:45 AM   Contact information:   [address] [place] [address] [phone]        Time spent on Discharge:   Signed:   Divinity Kyler M.D. [facility] 09/17/2015, 12:11 PM Pager: [phone]

## 2015-09-17 NOTE — Progress Notes (Signed)
Patient is discharge to home accompanied by patient's family  Members and NT via wheelchair. Discharge instructions given . Patient verbalizes understanding. All personal belongings given. Telemetry box and IV removed prior to discharge and site in good condition. Patient with ReDs Vest upon discharge via research team.

## 2015-09-19 ENCOUNTER — Other Ambulatory Visit: Payer: Self-pay | Admitting: *Deleted

## 2015-09-19 ENCOUNTER — Telehealth: Payer: Self-pay | Admitting: *Deleted

## 2015-09-19 NOTE — Patient Outreach (Addendum)
[facility]) Care Management  09/19/2015  [name] 1923/02/04 [id number]   Transition of care (week 1)  RN spoke with pt and her son (primary caregiver). RN introduced the [facility] and purpose for today's call. RN inquired on pt's recent hospitalization and inquired on her HF. Caregiver confirms pt is weighing however scales slightly different from the hospital's readings but pt denies any swelling or noted symptoms at this time. Discussed the HF zones and confirmed pt remains in the GREEN zone with a weight today and yesterday at 143 lbs. RN reviewed the HF zones and review the possible symptoms as pt denied. RN completed the transition of care template. RN offered community case management for home visits however pt only receptive to ongoing transition of care calls. Pt receptive to a follow up call next week on Tuesday as her caregiver indicated they will follow up with Dr. Jonny Ruiz on Monday. Will continue educating pt on her HF and HF monitoring related to the HF zones.Plan of care discussed along with the goals for increase knowledge base of HF, daily weights related attending medical appointments. No other inquires or resources needed at this time.   Elliot Cousin, RN Care Management Coordinator [facility] Main Office [phone]

## 2015-09-19 NOTE — Telephone Encounter (Signed)
Transition Care Management Follow-up Telephone Call   Date discharged? 09/17/15   How have you been since you were released from the hospital? Pt states she feel alright   Do you understand why you were in the hospital? YES   Do you understand the discharge instructions? YES   Where were you discharged to? Home   Items Reviewed:  Medications reviewed: YES  Allergies reviewed: YES  Dietary changes reviewed: YES, heart healthy  Referrals reviewed: YES, she states no one has contacted yet about home health orders   Functional Questionnaire:   Activities of Daily Living (ADLs):   She states she are independent in the following: ambulation, bathing and hygiene, feeding, continence, grooming, toileting and dressing States she  require assistance with the following: ambulation   Any transportation issues/concerns?: NO   Any patient concerns? NO   Confirmed importance and date/time of follow-up visits scheduled YES, appt 09/27/15, inform pt to make sure she try to keep appt. She haven't seen MD since 2014, and any orders that home health orders he will not be able to ok if she hasn't seen MD  Provider Appointment booked with Dr. Jonny Ruiz  Confirmed with patient if condition begins to worsen call PCP or go to the ER.  Patient was given the office number and encouraged to call back with question or concerns.  : YES

## 2015-09-27 ENCOUNTER — Inpatient Hospital Stay: Payer: Medicare Other | Admitting: Internal Medicine

## 2015-09-27 ENCOUNTER — Other Ambulatory Visit: Payer: Self-pay | Admitting: *Deleted

## 2015-09-27 NOTE — Patient Outreach (Addendum)
[facility]) Care Management  09/27/2015  Tiffany [name] 1922/08/12 [id number]   Spoke with pt today with confirmation who requested RN speak with her caregiver son. Reports pt doing well with reported weights in monitoring her HF at 141 lbs today and 140 on yesterday. Last week pt's weight was the same with no reported swelling or related symptoms. Son states he is fixing all the pt's meals with low sodium products for healthier eating habits. Praise caregiver for his initiation  RN reiterated on the HF zones and symptoms as caregiver able to verify pt remains in the GREEN zone and asymptomatic with her daily monitoring. Son has admitted pt does not weight daily. RN stressed the importance of why it is importance for pt to weigh daily as a prevention measures on any precipitating symptoms that may occur. RN again offered community home visits however son indicates pt has had this medical condition for over 20 years and feel they are able to monitor pt's HF closely now that her pacer is doing it's job.  Son reports one issue at this time with pt cancelling her appointments with one cancelled today. Son indicated he was going to contact pt's providers and alert them that he has Healthcare Power of [place] and no appointments are to be cancelled unless he cancels them. Note there has been several telephonic conversations during pt's events and needed medication refills that have taken place but son has indicated due to the pt's limitation at 82 y/o it's "been a well" since she had an office visit with pt's primary provider but pt has visited her cardiologist a lot due to the issues pt was having with her pacer in the pass months. RN strongly encouraged caregiver to keep provider aware of the events and issues going on with the pt.  RN requested to continue transition of care contacts over the next few weeks (caregiver receptive in making this decision for the pt). Will schedule next week  telephonic call accordingly.  Elliot Cousin, RN Care Management Coordinator [facility] Main Office [phone]

## 2015-10-04 ENCOUNTER — Other Ambulatory Visit: Payer: Self-pay | Admitting: *Deleted

## 2015-10-04 NOTE — Patient Outreach (Signed)
[facility]) Care Management  10/04/2015  Tiffany [name] 1922-06-27 [id number]   Transition of care  RN spoke with pt today and receiving a update on pt's ongoing recover. RN inquired if her son was available however pt reports her son was up late last night and currently was sleeping. Pt states she has been weight but at night. RN strongly encouraged pt to empty her bladder every morning and weight prior to breakfast for a more dry weight. Note pt reports last night's weight at 143 lbs with last weeks weight at 141 lbs. Will verify pt remains in the GREEN zone and review the zones and what to do if encountered. Pt denies any signs or symptoms today however trouble lying down and sleeping however able to sleep sitting up in the chair better. No other issues or request at this time as plan of care and goals review and RN strongly stress the importance of following up with her primary provider based upon her recent hospital discharge. Note son indicated last week he would reschedule the appointment the pt has cancelled without his knowledge. RN will follow up with pt's son and inquired further on pt's progress on the next scheduled telephonic contact.   Elliot Cousin, RN Care Management Coordinator [facility] Main Office [phone]

## 2015-10-10 ENCOUNTER — Other Ambulatory Visit: Payer: Self-pay | Admitting: *Deleted

## 2015-10-10 NOTE — Patient Outreach (Signed)
[facility]) Care Management  10/10/2015  Tess Beighley Dantes Oct 30, 1922 [id number]  Transition of care  RN attempted outreach call to pt today however unsuccessful. Unable to leave a message to this only number listed. Will continue outreach calls accordingly.   Elliot Cousin, RN Care Management Coordinator [facility] Main Office [phone]

## 2015-10-12 ENCOUNTER — Other Ambulatory Visit: Payer: Self-pay | Admitting: *Deleted

## 2015-10-12 ENCOUNTER — Encounter: Payer: Self-pay | Admitting: *Deleted

## 2015-10-12 ENCOUNTER — Ambulatory Visit: Payer: Medicare Other | Admitting: Internal Medicine

## 2015-10-12 NOTE — Patient Outreach (Signed)
[facility]) Care Management  10/12/2015  Grant 12/22/1922 [id number]   Transition of care  RN spoke with pt today and inquired on her ongoing management of care. Pt reports she is doing well with the assistance of her son. States her weight today was 147 lbs with no swelling or encountered symptoms. RN reiterated on the HF zones and confirmed pt remains in the GREEN zone today with no acute events since the last conversation. Pt expressed how appreciative she has been for the follow up calls but feels she is able to manage her ongoing HF and opt to discontinue ongoing [facility] services at this time. Pt opt to decline [facility] and again express how grateful she was for the call so far. RN able to verify other assessments and provided information [facility] if needed in the future. RN has also alerted pt that her primary provider Dr. Jenny Reichmann will be notified that she has opt to discontinue the ongoing transition of care and services with [facility] (pt with understanding). The plan of care and goals were discussed as pt continues to met these goals however a few days early based upon pt's request to end the program. No other request or inquires at this time this case will be closed and administrative staff via [facility] will be informed of case closure.   Raina Mina, RN Care Management Coordinator [place] Office [phone]

## 2015-12-13 ENCOUNTER — Telehealth: Payer: Self-pay | Admitting: *Deleted

## 2015-12-13 NOTE — Telephone Encounter (Signed)
I spoke with patient for 1-month phone call follow-up. Patient has not been re-hospitalized in last 3 months. I thanked patient for her participation in the study.

## 2016-02-29 ENCOUNTER — Encounter (HOSPITAL_COMMUNITY): Payer: Self-pay

## 2016-02-29 ENCOUNTER — Emergency Department (HOSPITAL_COMMUNITY)
Admission: EM | Admit: 2016-02-29 | Discharge: 2016-02-29 | Disposition: A | Discharge status: Home / Self Care | Payer: Medicare Other | Attending: Emergency Medicine | Admitting: Emergency Medicine

## 2016-02-29 ENCOUNTER — Emergency Department (HOSPITAL_COMMUNITY): Payer: Medicare Other

## 2016-02-29 DIAGNOSIS — I110 Hypertensive heart disease with heart failure: Secondary | ICD-10-CM | POA: Insufficient documentation

## 2016-02-29 DIAGNOSIS — I509 Heart failure, unspecified: Secondary | ICD-10-CM | POA: Diagnosis not present

## 2016-02-29 DIAGNOSIS — Z7982 Long term (current) use of aspirin: Secondary | ICD-10-CM | POA: Insufficient documentation

## 2016-02-29 DIAGNOSIS — Z79899 Other long term (current) drug therapy: Secondary | ICD-10-CM | POA: Insufficient documentation

## 2016-02-29 DIAGNOSIS — Z950 Presence of cardiac pacemaker: Secondary | ICD-10-CM | POA: Diagnosis not present

## 2016-02-29 DIAGNOSIS — E039 Hypothyroidism, unspecified: Secondary | ICD-10-CM | POA: Diagnosis not present

## 2016-02-29 DIAGNOSIS — R0602 Shortness of breath: Secondary | ICD-10-CM | POA: Diagnosis present

## 2016-02-29 LAB — I-STAT TROPONIN, ED: TROPONIN I, POC: 0.05 ng/mL (ref 0.00–0.08)

## 2016-02-29 LAB — CBC
HEMATOCRIT: 34.5 % — AB (ref 36.0–46.0)
HEMOGLOBIN: 11.4 g/dL — AB (ref 12.0–15.0)
MCH: 34.3 pg — ABNORMAL HIGH (ref 26.0–34.0)
MCHC: 33 g/dL (ref 30.0–36.0)
MCV: 103.9 fL — ABNORMAL HIGH (ref 78.0–100.0)
Platelets: 148 10*3/uL — ABNORMAL LOW (ref 150–400)
RBC: 3.32 MIL/uL — AB (ref 3.87–5.11)
RDW: 14.5 % (ref 11.5–15.5)
WBC: 7.8 10*3/uL (ref 4.0–10.5)

## 2016-02-29 LAB — BASIC METABOLIC PANEL
ANION GAP: 10 (ref 5–15)
BUN: 24 mg/dL — ABNORMAL HIGH (ref 6–20)
CALCIUM: 10 mg/dL (ref 8.9–10.3)
CO2: 22 mmol/L (ref 22–32)
Chloride: 108 mmol/L (ref 101–111)
Creatinine, Ser: 1.15 mg/dL — ABNORMAL HIGH (ref 0.44–1.00)
GFR, EST AFRICAN AMERICAN: 47 mL/min — AB (ref >60)
GFR, EST NON AFRICAN AMERICAN: 40 mL/min — AB (ref >60)
Glucose, Bld: 113 mg/dL — ABNORMAL HIGH (ref 65–99)
POTASSIUM: 3.9 mmol/L (ref 3.5–5.1)
SODIUM: 140 mmol/L (ref 135–145)

## 2016-02-29 LAB — BRAIN NATRIURETIC PEPTIDE: B NATRIURETIC PEPTIDE 5: 1114.4 pg/mL — AB (ref 0.0–100.0)

## 2016-02-29 MED ORDER — FUROSEMIDE 20 MG PO TABS
40.0000 mg | ORAL_TABLET | Freq: Once | ORAL | Status: AC
  Administered 2016-02-29: 40 mg via ORAL
  Filled 2016-02-29: qty 2

## 2016-02-29 MED ORDER — POTASSIUM CHLORIDE ER 10 MEQ PO TBCR: 10.0000 meq | EXTENDED_RELEASE_TABLET | Freq: Every day | ORAL | 0 refills | Status: DC

## 2016-02-29 MED ORDER — FUROSEMIDE 20 MG PO TABS: 20.0000 mg | ORAL_TABLET | Freq: Every day | ORAL | 0 refills | Status: DC

## 2016-02-29 MED ORDER — POTASSIUM CHLORIDE CRYS ER 20 MEQ PO TBCR
20.0000 meq | EXTENDED_RELEASE_TABLET | Freq: Once | ORAL | Status: AC
  Administered 2016-02-29: 20 meq via ORAL
  Filled 2016-02-29: qty 1

## 2016-02-29 NOTE — ED Triage Notes (Signed)
No answer when called 

## 2016-02-29 NOTE — ED Provider Notes (Signed)
[facility]-EMERGENCY DEPT Provider Note   CSN: [id number] Arrival date & time: 02/29/16  1705     History   Chief Complaint Chief Complaint  Patient presents with  . Shortness of Breath    HPI Tiffany [name] is a 82 y.o. female.  HPI Patient and her son report that she has gotten increasingly short of breath over the past number of days to weeks. Today things to gotten worse. They were concerned because it is similar to when she got an episode of congestive heart failure. Her son reports since they've been in the emergency department in the coolant [place] she seems much better. The patient comes concurs. She has had some increased swelling in her legs and feet this developed over the past week. Patient has been out of Lasix and potassium for a while. Her son reports that after her hospitalization she did very well taking those medications. He reports that since she ran out he is noticing a gradual increase in shortness of breath and swelling. No fevers no chills no cough. Patient reports sometimes she feels like she has a "stitch" in her left side. This is been fairly consistent. Past Medical History:  Diagnosis Date  . Allergy   . Arthritis   . Complete heart block (HCC) 07/18/15   MDT PPM, Dr. Johney Frame  . Depression    occasional- situational  . Essential hypertension, benign   . Glaucoma   . Hearing loss 02/02/2011  . HOH (hard of hearing)   . Hyperlipidemia   . Hypothyroidism    not currently  . Impaired glucose tolerance 02/02/2011  . Macular degeneration   . Nonischemic cardiomyopathy (HCC)    EF 45-50% in 2009  . Osteoporosis   . Shortness of breath dyspnea    with exertion  . Status post biventricular pacemaker    with AICD; 2004; revision 2007, explanted 07/11/15, Dr. Ladona Ridgel  . Systolic heart failure   . Ventricular tachycardia (HCC)   . Vitamin D deficiency 02/02/2011    Patient Active Problem List   Diagnosis Date Noted  . AKI (acute kidney injury) (HCC)   . Acute on  chronic systolic (congestive) heart failure 09/14/2015  . CHF exacerbation (HCC) 09/14/2015  . Pacemaker infection (HCC)   . Dementia with behavioral disturbance   . Heart block AV complete (HCC) 03/08/2014  . Fall 03/27/2013  . Near syncope 03/27/2013  . UTI (urinary tract infection) 03/27/2013  . Biventricular cardiac pacemaker in situ 02/22/2012  . Insomnia 02/02/2011  . Vitamin D deficiency 02/02/2011  . Impaired glucose tolerance 02/02/2011  . Hearing loss 02/02/2011  . Arthritis   . Glaucoma   . Complete heart block (HCC)   . Osteoporosis   . Allergy   . Preventative health care 01/26/2011  . Status post biventricular pacer-ICD   . Essential hypertension, benign   . Hyperlipidemia   . Hypothyroidism   . Nonischemic cardiomyopathy (HCC)   . Systolic heart failure (HCC)   . Ventricular tachycardia [facility])     Past Surgical History:  Procedure Laterality Date  . APPENDECTOMY    . BI-VENTRICULAR PACEMAKER REVISION N/A 03/08/2014   Procedure: BI-VENTRICULAR PACEMAKER REVISION (CRT-R);  Surgeon: Marinus Maw, MD;  Location: [facility] C[facility] LAB;  Service: Cardiovascular;  Laterality: N/A;  . BI-VENTRICULAR PACEMAKER REVISION N/A 06/16/2014   Procedure: BI-VENTRICULAR PACEMAKER REVISION (CRT-R);  Surgeon: Marinus Maw, MD;  Location: [facility] CATH LAB;  Service: Cardiovascular;  Laterality: N/A;  . BI-VENTRICULAR PACEMAKER REVISION (CRT-R)  03-08-2014  MDT CRTP generator change with use of previously abandoned RV lead due to ICD RV lead failure  . Cataract surgery    . CHOLECYSTECTOMY    . complete heart block  07/18/15   MDT PPM, Dr. Johney Frame  . EP IMPLANTABLE DEVICE N/A 07/18/2015   Procedure: Pacemaker Implant;  Surgeon: Hillis Range, MD;  Location: [facility] INVASIVE CV LAB;  Service: Cardiovascular;  Laterality: N/A;  . PACEMAKER LEAD REMOVAL N/A 07/11/2015   Procedure: PACEMAKER LEAD REMOVAL with placement of temporary pacing wire;  Surgeon: Marinus Maw, MD;  Location: [facility] OR;  Service:  Cardiovascular;  Laterality: N/A;  . PERIPHERAL VASCULAR CATHETERIZATION N/A 07/14/2015   Procedure: Upper Extremity Venography / Left;  Surgeon: Will Jorja Loa, MD;  Location: [facility] INVASIVE CV LAB;  Service: Cardiovascular;  Laterality: N/A;  . TONSILLECTOMY      OB History    No data available       Home Medications    Prior to Admission medications   Medication Sig Start Date End Date Taking? Authorizing Provider  aspirin 325 MG EC tablet Take 325 mg by mouth every 6 (six) hours as needed for pain.     Historical Provider, MD  carvedilol (COREG) 12.5 MG tablet Take 1 tablet (12.5 mg total) by mouth 2 (two) times daily. 08/29/15   Marinus Maw, MD  furosemide (LASIX) 20 MG tablet Take 1 tablet (20 mg total) by mouth daily. 09/17/15   Ripudeep Jenna Luo, MD  furosemide (LASIX) 20 MG tablet Take 1 tablet (20 mg total) by mouth daily. 02/29/16   Arby Barrette, MD  potassium chloride (K-DUR) 10 MEQ tablet Take 1 tablet (10 mEq total) by mouth daily. 02/29/16   Arby Barrette, MD  potassium chloride SA (K-DUR,KLOR-CON) 10 MEQ tablet Take 1 tablet (10 mEq total) by mouth daily. 09/17/15   Ripudeep Jenna Luo, MD    Family History Family History  Problem Relation Age of Onset  . Arthritis Other   . Cancer Other     lung cancer  . Heart disease Other   . Hypertension Other   . Hypertension Mother   . Coronary artery disease Mother     Social History Social History  Substance Use Topics  . Smoking status: Never Smoker  . Smokeless tobacco: Never Used     Comment: smoked a little when she was 22  . Alcohol use No     Allergies   Review of patient's allergies indicates no known allergies.   Review of Systems Review of Systems  10 Systems reviewed and are negative for acute change except as noted in the HPI.  Physical Exam Updated Vital Signs BP 136/85 (BP Location: Right Arm)   Pulse 92   Temp 97.7 F (36.5 C) (Oral)   Resp 13   Ht 4\' 8"  (1.422 m)   Wt 145 lb (65.8 kg)    SpO2 96%   BMI 32.51 kg/m   Physical Exam  Constitutional: She is oriented to person, place, and time. She appears well-developed and well-nourished. No distress.  Patient is alert and nontoxic. She is setting up at the edge of the stressor no distress  HENT:  Head: Normocephalic and atraumatic.  Mouth/Throat: Oropharynx is clear and moist.  Eyes: EOM are normal.  Neck: Neck supple.  Cardiovascular:  Distant heart sounds. Intermittent ectopic beats.  Pulmonary/Chest:  Patient has no respiratory distress. Fine crackles bilateral bases. Good air flow.  Abdominal: Soft. She exhibits no distension. There is no tenderness.  Musculoskeletal: Normal range of motion. She exhibits edema.  2+ pitting edema bilateral lower extremities.  Neurological: She is alert and oriented to person, place, and time. No cranial nerve deficit. She exhibits normal muscle tone. Coordination normal.  Skin: Skin is warm and dry.  Psychiatric: She has a normal mood and affect.     ED Treatments / Results  Labs (all labs ordered are listed, but only abnormal results are displayed) Labs Reviewed  BASIC METABOLIC PANEL - Abnormal; Notable for the following:       Result Value   Glucose, Bld 113 (*)    BUN 24 (*)    Creatinine, Ser 1.15 (*)    GFR calc non Af Amer 40 (*)    GFR calc Af Amer 47 (*)    All other components within normal limits  CBC - Abnormal; Notable for the following:    RBC 3.32 (*)    Hemoglobin 11.4 (*)    HCT 34.5 (*)    MCV 103.9 (*)    MCH 34.3 (*)    Platelets 148 (*)    All other components within normal limits  BRAIN NATRIURETIC PEPTIDE - Abnormal; Notable for the following:    B Natriuretic Peptide 1,114.4 (*)    All other components within normal limits  I-STAT TROPOININ, ED    EKG  EKG Interpretation  Date/Time:  Wednesday February 29 2016 17:08:36 EDT Ventricular Rate:  99 PR Interval:  166 QRS Duration: 170 QT Interval:  396 QTC Calculation: 508 R  Axis:   -57 Text Interpretation:  Atrial-sensed ventricular-paced rhythm Abnormal ECG agree. Confirmed by Donnald GarrePfeiffer, MD, Lebron ConnersMarcy [phone]([id number]) on 02/29/2016 9:55:19 PM       Radiology Dg Chest 2 View  Result Date: 02/29/2016 CLINICAL DATA:  Shortness of breath and left-sided chest pressure for 2 days. EXAM: CHEST  2 VIEW COMPARISON:  09/14/2015. FINDINGS: Trachea is midline. Heart is enlarged. Thoracic aorta is calcified. Pacemaker lead tips are in the right atrium and right ventricle. Possible tiny bilateral pleural effusions versus scarring at the costophrenic angles. IMPRESSION: 1. Tiny bilateral pleural effusions versus scarring. 2.  Aortic atherosclerosis (ICD10-170.0). Electronically Signed   By: Leanna BattlesMelinda  Blietz M.D.   On: 02/29/2016 17:36    Procedures Procedures (including critical care time)  Medications Ordered in ED Medications  furosemide (LASIX) tablet 40 mg (not administered)  potassium chloride SA (K-DUR,KLOR-CON) CR tablet 20 mEq (not administered)     Initial Impression / Assessment and Plan / ED Course  I have reviewed the triage vital signs and the nursing notes.  Pertinent labs & imaging results that were available during my care of the patient were reviewed by me and considered in my medical decision making (see chart for details).  Clinical Course    Final Clinical Impressions(s) / ED Diagnoses   Final diagnoses:  Acute on chronic congestive heart failure, unspecified congestive heart failure type [facility](HCC)  Patient is clinically well in appearance. She does have symptoms consistent with CHF exacerbation. Chest x-ray does not show gross overload however Rales are present on examination of peripheral edema. Described as incrementally developing since running out of Lasix. I do feel patient is safe to initiate outpatient oral therapy. Lasix 40 mg by mouth and potassium 20 mEq administered prior to discharge. Patient is with her son who appears to be a good caregiver. They  will return if any worsening or changing of symptoms.  New Prescriptions New Prescriptions   FUROSEMIDE (LASIX) 20 MG TABLET  Take 1 tablet (20 mg total) by mouth daily.   POTASSIUM CHLORIDE (K-DUR) 10 MEQ TABLET    Take 1 tablet (10 mEq total) by mouth daily.     Arby Barrette, MD 02/29/16 2215

## 2016-02-29 NOTE — ED Triage Notes (Signed)
Patient complains of shortness of breath x 1 day. States she feels as if she cant get a good breath. Denies chest pain. No dyspnea. Speaking complete sentences. Alert and [facility]

## 2016-03-18 ENCOUNTER — Other Ambulatory Visit: Payer: Self-pay | Admitting: Internal Medicine

## 2016-09-24 ENCOUNTER — Telehealth: Payer: Self-pay

## 2016-09-24 NOTE — Telephone Encounter (Signed)
On 09/24/16 I received a death certificate from [facility] (original). The death certificate is for burial. The patient is a patient of Doctor Oliver Barre. The death certificate will be taken to [facility] this pm for signature.  ON 10-14-16 I received the death certificate back from Doctor Jonny Ruiz. I got the death certificate ready and called the funeral home to let them know the death certificate is ready for pickup.

## 2016-10-19 DEATH — deceased

## 2016-11-05 IMAGING — CR DG CHEST 1V PORT
1 series · 1 of 1 positions shown · non-contrast
Comparison: 03/27/2013

CLINICAL DATA: New pacemaker insertion.

EXAM:
PORTABLE CHEST 1 VIEW

[AP]
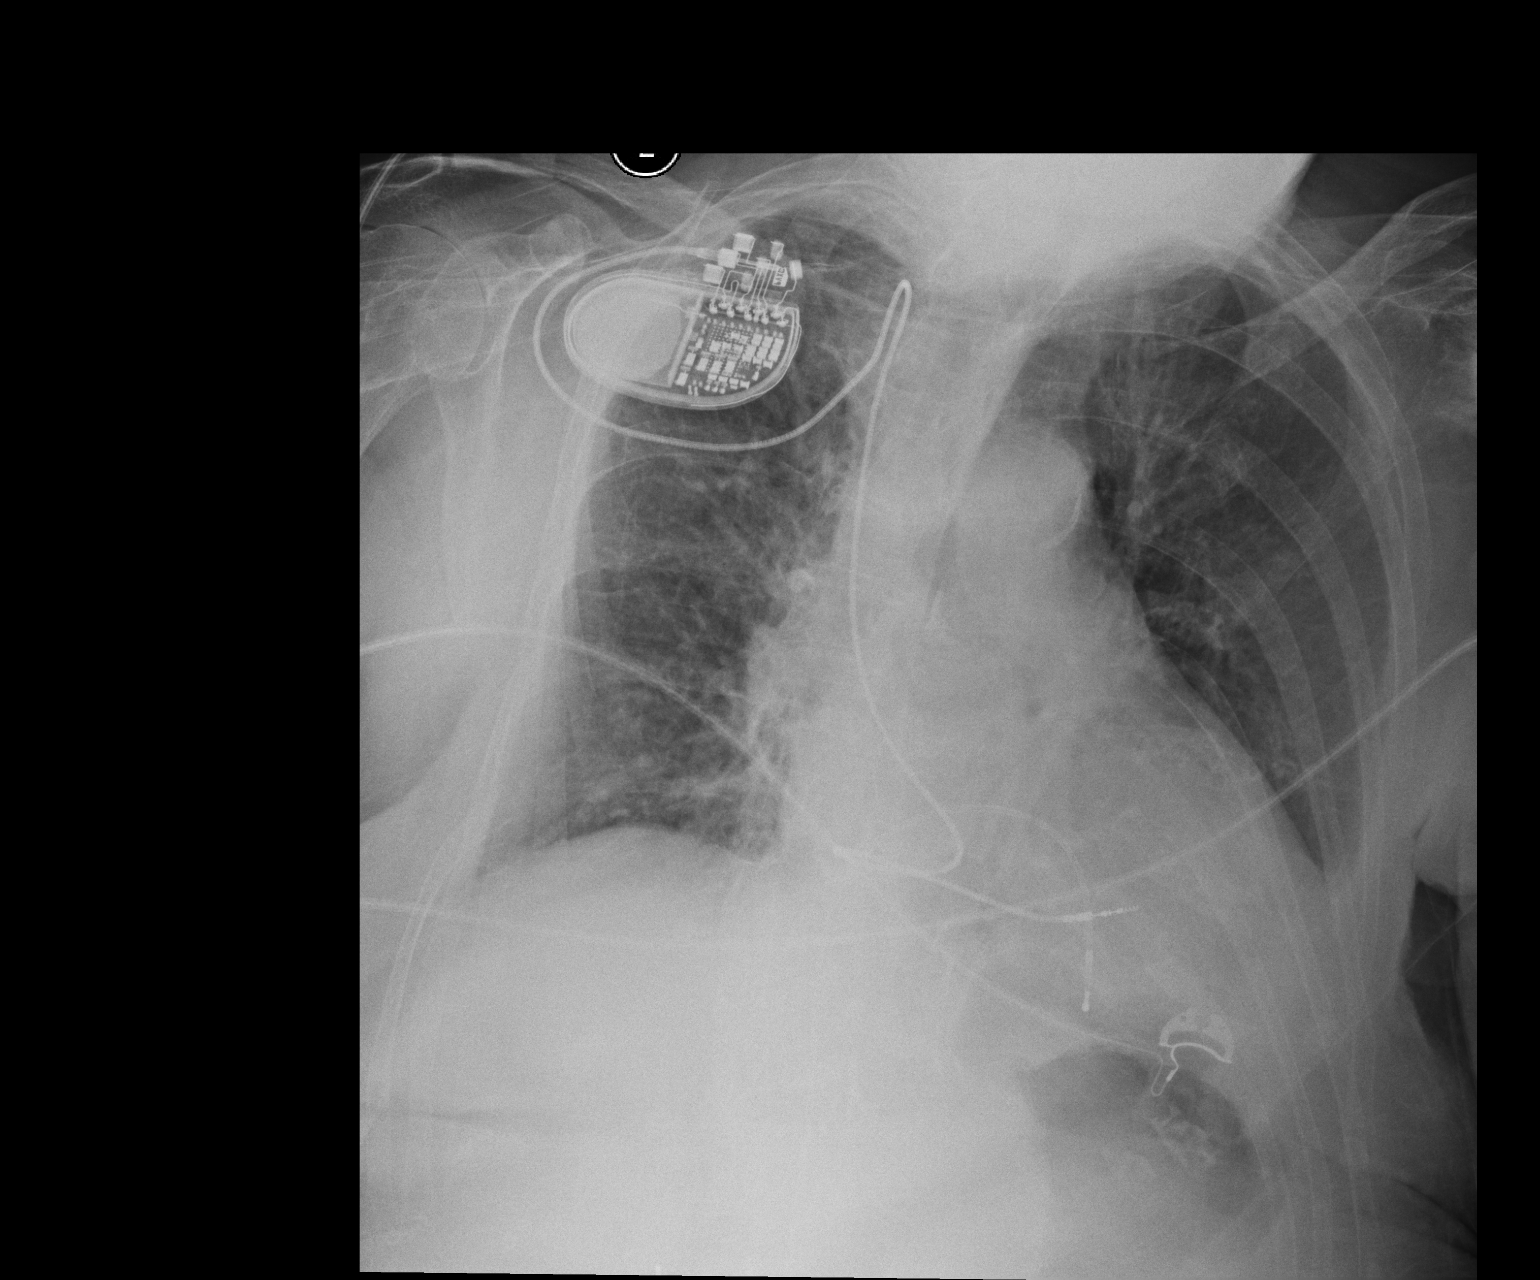

[1 of 1 positions shown; findings below may reference images not displayed]

FINDINGS: The heart is enlarged but stable. The prior pacemaker and pacer
wires have been removed. There is a new single right ventricular
pacer wire via a right internal jugular approach. There is also a
femoral vein catheter coursing up into the right atrium and right
ventricle.
IMPRESSION: Removal of prior ventricular pacer wires and placement of a single
right ventricular pacer wire.

A femoral line is seen extending into the right ventricle.

## 2016-11-21 IMAGING — CR DG CHEST 1V PORT
1 series · 1 of 1 positions shown · non-contrast
Comparison: 07/19/2015

CLINICAL DATA: Chest wall bleeding

EXAM:
PORTABLE CHEST 1 VIEW

[AP]
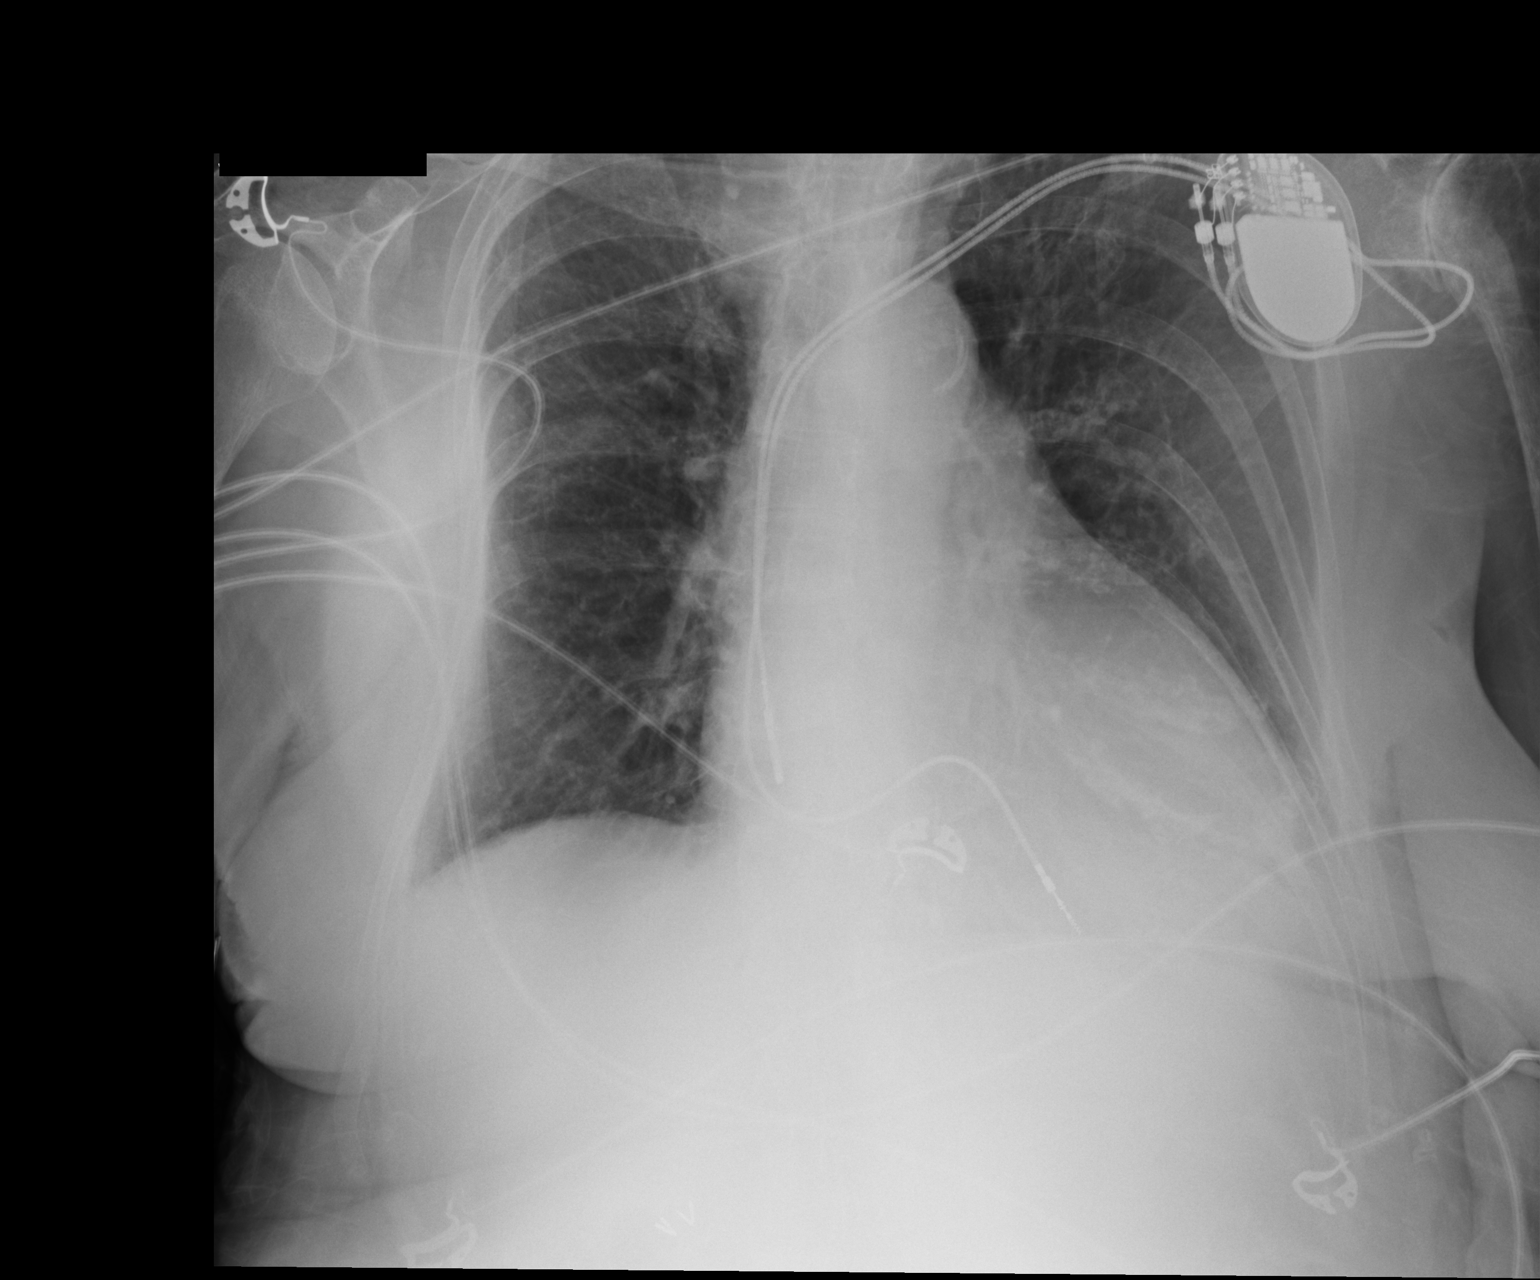

[1 of 1 positions shown; findings below may reference images not displayed]

FINDINGS: Cardiomegaly again noted. Dual lead cardiac pacemaker with leads in
right atrium and right ventricle. No infiltrate or pulmonary edema.
Mild left basilar atelectasis. Degenerative changes left shoulder.
Atherosclerotic calcifications of thoracic aorta again noted.
IMPRESSION: No active disease.  Dual lead cardiac pacemaker in place.
